# Patient Record
Sex: Female | Born: 1960 | Race: White | Hispanic: No | Marital: Married | Smoking: Former smoker
Health system: Southern US, Community
[De-identification: ages and names within clinical notes are randomized; demographics above are authoritative.]

## PROBLEM LIST (undated history)

## (undated) DIAGNOSIS — E785 Hyperlipidemia, unspecified: Secondary | ICD-10-CM

## (undated) DIAGNOSIS — B009 Herpesviral infection, unspecified: Secondary | ICD-10-CM

## (undated) DIAGNOSIS — E119 Type 2 diabetes mellitus without complications: Secondary | ICD-10-CM

## (undated) DIAGNOSIS — G4733 Obstructive sleep apnea (adult) (pediatric): Secondary | ICD-10-CM

## (undated) DIAGNOSIS — Z9989 Dependence on other enabling machines and devices: Secondary | ICD-10-CM

## (undated) DIAGNOSIS — N393 Stress incontinence (female) (male): Secondary | ICD-10-CM

## (undated) DIAGNOSIS — E039 Hypothyroidism, unspecified: Secondary | ICD-10-CM

## (undated) HISTORY — DX: Hypothyroidism, unspecified: E03.9

## (undated) HISTORY — DX: Hyperlipidemia, unspecified: E78.5

## (undated) HISTORY — DX: Obstructive sleep apnea (adult) (pediatric): G47.33

## (undated) HISTORY — DX: Herpesviral infection, unspecified: B00.9

## (undated) HISTORY — PX: CHOLECYSTECTOMY: SHX55

## (undated) HISTORY — DX: Type 2 diabetes mellitus without complications: E11.9

## (undated) HISTORY — PX: KNEE ARTHROSCOPY: SUR90

## (undated) HISTORY — DX: Stress incontinence (female) (male): N39.3

## (undated) HISTORY — DX: Obstructive sleep apnea (adult) (pediatric): Z99.89

## (undated) HISTORY — DX: Morbid (severe) obesity due to excess calories: E66.01

---

## 1998-09-19 ENCOUNTER — Other Ambulatory Visit: Admission: RE | Admit: 1998-09-19 | Discharge: 1998-09-19 | Payer: Self-pay | Admitting: Obstetrics and Gynecology

## 1998-11-01 ENCOUNTER — Ambulatory Visit (HOSPITAL_COMMUNITY): Admission: RE | Admit: 1998-11-01 | Discharge: 1998-11-01 | Payer: Self-pay | Admitting: Obstetrics and Gynecology

## 1999-05-12 HISTORY — PX: TUBAL LIGATION: SHX77

## 1999-05-12 HISTORY — PX: OTHER SURGICAL HISTORY: SHX169

## 2000-08-17 ENCOUNTER — Other Ambulatory Visit: Admission: RE | Admit: 2000-08-17 | Discharge: 2000-08-17 | Payer: Self-pay | Admitting: Obstetrics and Gynecology

## 2001-09-08 ENCOUNTER — Other Ambulatory Visit: Admission: RE | Admit: 2001-09-08 | Discharge: 2001-09-08 | Payer: Self-pay | Admitting: Obstetrics and Gynecology

## 2008-06-11 ENCOUNTER — Encounter: Admission: RE | Admit: 2008-06-11 | Discharge: 2008-06-11 | Payer: Self-pay | Admitting: Family Medicine

## 2009-01-21 ENCOUNTER — Ambulatory Visit (HOSPITAL_COMMUNITY): Admission: RE | Admit: 2009-01-21 | Discharge: 2009-01-21 | Payer: Self-pay | Admitting: Chiropractic Medicine

## 2009-03-14 ENCOUNTER — Encounter: Admission: RE | Admit: 2009-03-14 | Discharge: 2009-03-14 | Payer: Self-pay | Admitting: Family Medicine

## 2009-04-18 ENCOUNTER — Ambulatory Visit (HOSPITAL_COMMUNITY): Admission: RE | Admit: 2009-04-18 | Discharge: 2009-04-18 | Payer: Self-pay | Admitting: General Surgery

## 2010-01-27 ENCOUNTER — Encounter
Admission: RE | Admit: 2010-01-27 | Discharge: 2010-04-27 | Payer: Self-pay | Source: Home / Self Care | Attending: Family Medicine | Admitting: Family Medicine

## 2010-08-12 LAB — HEPATIC FUNCTION PANEL
ALT: 67 U/L — ABNORMAL HIGH (ref 0–35)
AST: 37 U/L (ref 0–37)
Alkaline Phosphatase: 42 U/L (ref 39–117)
Indirect Bilirubin: 0.6 mg/dL (ref 0.3–0.9)
Total Protein: 7.9 g/dL (ref 6.0–8.3)

## 2011-08-03 ENCOUNTER — Ambulatory Visit (INDEPENDENT_AMBULATORY_CARE_PROVIDER_SITE_OTHER): Payer: BC Managed Care – PPO | Admitting: Family Medicine

## 2011-08-03 ENCOUNTER — Encounter: Payer: Self-pay | Admitting: Family Medicine

## 2011-08-03 VITALS — BP 120/79 | HR 64 | Temp 99.0°F | Resp 16 | Ht 66.0 in | Wt 215.0 lb

## 2011-08-03 DIAGNOSIS — J309 Allergic rhinitis, unspecified: Secondary | ICD-10-CM | POA: Insufficient documentation

## 2011-08-03 DIAGNOSIS — B009 Herpesviral infection, unspecified: Secondary | ICD-10-CM | POA: Insufficient documentation

## 2011-08-03 DIAGNOSIS — N951 Menopausal and female climacteric states: Secondary | ICD-10-CM | POA: Insufficient documentation

## 2011-08-03 DIAGNOSIS — E119 Type 2 diabetes mellitus without complications: Secondary | ICD-10-CM

## 2011-08-03 DIAGNOSIS — N393 Stress incontinence (female) (male): Secondary | ICD-10-CM | POA: Insufficient documentation

## 2011-08-03 DIAGNOSIS — E785 Hyperlipidemia, unspecified: Secondary | ICD-10-CM

## 2011-08-03 DIAGNOSIS — N898 Other specified noninflammatory disorders of vagina: Secondary | ICD-10-CM

## 2011-08-03 DIAGNOSIS — E118 Type 2 diabetes mellitus with unspecified complications: Secondary | ICD-10-CM | POA: Insufficient documentation

## 2011-08-03 DIAGNOSIS — G2581 Restless legs syndrome: Secondary | ICD-10-CM | POA: Insufficient documentation

## 2011-08-03 LAB — CBC
HCT: 42.3 % (ref 36.0–46.0)
Hemoglobin: 13.6 g/dL (ref 12.0–15.0)
MCH: 28 pg (ref 26.0–34.0)
MCV: 87.2 fL (ref 78.0–100.0)
Platelets: 290 10*3/uL (ref 150–400)
RBC: 4.85 MIL/uL (ref 3.87–5.11)

## 2011-08-03 NOTE — Progress Notes (Signed)
  Subjective:    Patient ID: Alejandra Ray, female    DOB: 1961-01-09, 51 y.o.   MRN: [id number]  Diabetes She presents for her follow-up diabetic visit. She has type 2 diabetes mellitus. Her disease course has been stable. There are no hypoglycemic associated symptoms. Pertinent negatives for diabetes include no blurred vision and no foot ulcerations. There are no hypoglycemic complications. Symptoms are stable. Pertinent negatives for diabetic complications include no peripheral neuropathy or retinopathy. Risk factors for coronary artery disease include diabetes mellitus and dyslipidemia (quit smoking 1998). She is compliant with treatment all of the time. She is following a diabetic diet. When asked about meal planning, she reported none. She has not had a previous visit with a dietician. She rarely participates in exercise. There is no change (flucated with recent illness) in her home blood glucose trend. Her overall blood glucose range is 110-130 mg/dl. She does not see a podiatrist.Eye exam is current.   With recent illness elevated BS's in the 140's have trended back down.  Eye exam scheduled for 8/13  Mammogram 4/13  Monthly menses since October lasting 7-10 days with heavy bleeding; on 28 day cycle.  Pap smear 7/12 was normal.    Review of Systems  Eyes: Negative for blurred vision.       Objective:   Physical Exam  Constitutional: She appears well-developed and well-nourished.  HENT:  Head: Normocephalic and atraumatic.  Neck: Neck supple. No thyromegaly present.  Cardiovascular: Normal rate, regular rhythm and normal heart sounds.   Pulmonary/Chest: Effort normal and breath sounds normal.  Abdominal: Soft. Bowel sounds are normal. There is no hepatosplenomegaly.  Lymphadenopathy:    She has no cervical adenopathy.  Neurological: She is alert.  Skin: Skin is warm.  Psychiatric: She has a normal mood and affect.     Results for orders placed in visit on 08/03/11    POCT GLYCOSYLATED HEMOGLOBIN (HGB A1C)      Component Value Range   Hemoglobin A1C 5.9          Assessment & Plan:   1. Hyperlipidemia    2. Type 2 diabetes mellitus  POCT glycosylated hemoglobin (Hb A1C)  3. Perimenopausal  Follicle stimulating hormone, Ambulatory referral to Obstetrics / Gynecology, CBC  4. SUI (stress urinary incontinence, female)    5. Allergic rhinitis    6. HSV-2 infection    7. RLS (restless legs syndrome)     Continue current medications A referral was placed for patient to see  Dr. Ambrose Mantle.  I am concerned given patients age and heavy menses a pelvic ultrasound and EMB would be reassuring. Her pap smear 7/12 was normal and an [facility] was drawn today. Patient in agreement with above plan.

## 2011-08-03 NOTE — Progress Notes (Deleted)
  Subjective:    Patient ID: Alejandra Ray, female    DOB: 1960/06/23, 51 y.o.   MRN: [id number]  HPI    Review of Systems     Objective:   Physical Exam        Assessment & Plan:

## 2011-08-10 ENCOUNTER — Telehealth: Payer: Self-pay

## 2011-08-10 NOTE — Telephone Encounter (Signed)
Pt was called with lab results, please return her call.

## 2011-08-11 NOTE — Telephone Encounter (Signed)
Left message of result and if she had any other questions to CB.

## 2011-11-02 ENCOUNTER — Ambulatory Visit: Payer: BC Managed Care – PPO | Admitting: Family Medicine

## 2011-11-03 ENCOUNTER — Ambulatory Visit: Payer: BC Managed Care – PPO | Admitting: Family Medicine

## 2011-11-20 ENCOUNTER — Ambulatory Visit (INDEPENDENT_AMBULATORY_CARE_PROVIDER_SITE_OTHER): Payer: BC Managed Care – PPO | Admitting: Family Medicine

## 2011-11-20 VITALS — BP 124/81 | HR 65 | Temp 97.2°F | Resp 16 | Ht 66.5 in | Wt 218.0 lb

## 2011-11-20 DIAGNOSIS — IMO0001 Reserved for inherently not codable concepts without codable children: Secondary | ICD-10-CM

## 2011-11-20 DIAGNOSIS — E7800 Pure hypercholesterolemia, unspecified: Secondary | ICD-10-CM

## 2011-11-20 DIAGNOSIS — E669 Obesity, unspecified: Secondary | ICD-10-CM

## 2011-11-20 DIAGNOSIS — E559 Vitamin D deficiency, unspecified: Secondary | ICD-10-CM

## 2011-11-20 DIAGNOSIS — E119 Type 2 diabetes mellitus without complications: Secondary | ICD-10-CM

## 2011-11-20 DIAGNOSIS — M722 Plantar fascial fibromatosis: Secondary | ICD-10-CM

## 2011-11-20 LAB — BASIC METABOLIC PANEL
BUN: 12 mg/dL (ref 6–23)
CO2: 22 mEq/L (ref 19–32)
Calcium: 9.5 mg/dL (ref 8.4–10.5)
Chloride: 106 mEq/L (ref 96–112)
Creat: 0.62 mg/dL (ref 0.50–1.10)
Glucose, Bld: 123 mg/dL — ABNORMAL HIGH (ref 70–99)

## 2011-11-20 LAB — LIPID PANEL
LDL Cholesterol: 120 mg/dL — ABNORMAL HIGH (ref 0–99)
VLDL: 25 mg/dL (ref 0–40)

## 2011-11-20 LAB — POCT GLYCOSYLATED HEMOGLOBIN (HGB A1C): Hemoglobin A1C: 6

## 2011-11-20 MED ORDER — METFORMIN HCL 500 MG PO TABS: 500.0000 mg | ORAL_TABLET | Freq: Two times a day (BID) | ORAL | Status: DC

## 2011-11-20 MED ORDER — PRAVASTATIN SODIUM 20 MG PO TABS: 20.0000 mg | ORAL_TABLET | Freq: Every day | ORAL | Status: DC

## 2011-11-20 NOTE — Patient Instructions (Addendum)
Diabetes Meal Planning Guide The diabetes meal planning guide is a tool to help you plan your meals and snacks. It is important for people with diabetes to manage their blood glucose (sugar) levels. Choosing the right foods and the right amounts throughout your day will help control your blood glucose. Eating right can even help you improve your blood pressure and reach or maintain a healthy weight. CARBOHYDRATE COUNTING MADE EASY When you eat carbohydrates, they turn to sugar. This raises your blood glucose level. Counting carbohydrates can help you control this level so you feel better. When you plan your meals by counting carbohydrates, you can have more flexibility in what you eat and balance your medicine with your food intake. Carbohydrate counting simply means adding up the total amount of carbohydrate grams in your meals and snacks. Try to eat about the same amount at each meal. Foods with carbohydrates are listed below. Each portion below is 1 carbohydrate serving or 15 grams of carbohydrates. Ask your dietician how many grams of carbohydrates you should eat at each meal or snack. Grains and Starches  1 slice bread.    English muffin or hotdog/hamburger bun.    cup cold cereal (unsweetened).   ? cup cooked pasta or rice.    cup starchy vegetables (corn, potatoes, peas, beans, winter squash).   1 tortilla (6 inches).    bagel.   1 waffle or pancake (size of a CD).    cup cooked cereal.   4 to 6 small crackers.  *Whole grain is recommended. Fruit  1 cup fresh unsweetened berries, melon, papaya, pineapple.   1 small fresh fruit.    banana or mango.    cup fruit juice (4 oz unsweetened).    cup canned fruit in natural juice or water.   2 tbs dried fruit.   12 to 15 grapes or cherries.  Milk and Yogurt  1 cup fat-free or 1% milk.   1 cup soy milk.   6 oz light yogurt with sugar-free sweetener.   6 oz low-fat soy yogurt.   6 oz plain yogurt.   Vegetables  1 cup raw or  cup cooked is counted as 0 carbohydrates or a "free" food.   If you eat 3 or more servings at 1 meal, count them as 1 carbohydrate serving.  Other Carbohydrates   oz chips or pretzels.    cup ice cream or frozen yogurt.    cup sherbet or sorbet.   2 inch square cake, no frosting.   1 tbs honey, sugar, jam, jelly, or syrup.   2 small cookies.   3 squares of graham crackers.   3 cups popcorn.   6 crackers.   1 cup broth-based soup.   Count 1 cup casserole or other mixed foods as 2 carbohydrate servings.   Foods with less than 20 calories in a serving may be counted as 0 carbohydrates or a "free" food.  You may want to purchase a book or computer software that lists the carbohydrate gram counts of different foods. In addition, the nutrition facts panel on the labels of the foods you eat are a good source of this information. The label will tell you how big the serving size is and the total number of carbohydrate grams you will be eating per serving. Divide this number by 15 to obtain the number of carbohydrate servings in a portion. Remember, 1 carbohydrate serving equals 15 grams of carbohydrate. SERVING SIZES Measuring foods and serving sizes helps you   make sure you are getting the right amount of food. The list below tells how big or small some common serving sizes are.  1 oz.........4 stacked dice.   3 oz........Marland KitchenDeck of cards.   1 tsp.......Marland KitchenTip of little finger.   1 tbs......Marland KitchenMarland KitchenThumb.   2 tbs.......Marland KitchenGolf ball.    cup......Marland KitchenHalf of a fist.   1 cup.......Marland KitchenA fist.  SAMPLE DIABETES MEAL PLAN Below is a sample meal plan that includes foods from the grain and starches, dairy, vegetable, fruit, and meat groups. A dietician can individualize a meal plan to fit your calorie needs and tell you the number of servings needed from each food group. However, controlling the total amount of carbohydrates in your meal or snack is more important than  making sure you include all of the food groups at every meal. You may interchange carbohydrate containing foods (dairy, starches, and fruits). The meal plan below is an example of a 2000 calorie diet using carbohydrate counting. This meal plan has 17 carbohydrate servings. Breakfast  1 cup oatmeal (2 carb servings).    cup light yogurt (1 carb serving).   1 cup blueberries (1 carb serving).    cup almonds.  Snack  1 large apple (2 carb servings).   1 low-fat string cheese stick.  Lunch  Chicken breast salad.   1 cup spinach.    cup chopped tomatoes.   2 oz chicken breast, sliced.   2 tbs low-fat [place] dressing.   12 whole-wheat crackers (2 carb servings).   12 to 15 grapes (1 carb serving).   1 cup low-fat milk (1 carb serving).  Snack  1 cup carrots.    cup hummus (1 carb serving).  Dinner  3 oz broiled salmon.   1 cup brown rice (3 carb servings).  Snack  1  cups steamed broccoli (1 carb serving) drizzled with 1 tsp olive oil and lemon juice.   1 cup light pudding (2 carb servings).  DIABETES MEAL PLANNING WORKSHEET Your dietician can use this worksheet to help you decide how many servings of foods and what types of foods are right for you.  BREAKFAST Food Group and Servings / Carb Servings Grain/Starches __________________________________ Dairy __________________________________________ Vegetable ______________________________________ Fruit ___________________________________________ Meat __________________________________________ Fat ____________________________________________ LUNCH Food Group and Servings / Carb Servings Grain/Starches ___________________________________ Dairy ___________________________________________ Fruit ____________________________________________ Meat ___________________________________________ Fat _____________________________________________ Laural Golden Food Group and Servings / Carb Servings Grain/Starches  ___________________________________ Dairy ___________________________________________ Fruit ____________________________________________ Meat ___________________________________________ Fat _____________________________________________ SNACKS Food Group and Servings / Carb Servings Grain/Starches ___________________________________ Dairy ___________________________________________ Vegetable _______________________________________ Fruit ____________________________________________ Meat ___________________________________________ Fat _____________________________________________ DAILY TOTALS Starches _________________________ Vegetable ________________________ Fruit ____________________________ Dairy ____________________________ Meat ____________________________ Fat ______________________________ Document Released: 01/22/2005 Document Revised: 04/16/2011 Document Reviewed: 12/03/2008 [employer] Patient Information 2012 [place], [place].    Plantar Fasciitis Plantar fasciitis is a common condition that causes foot pain. It is soreness (inflammation) of the band of tough fibrous tissue on the bottom of the foot that runs from the heel bone (calcaneus) to the ball of the foot. The cause of this soreness may be from excessive standing, poor fitting shoes, running on hard surfaces, being overweight, having an abnormal walk, or overuse (this is common in runners) of the painful foot or feet. It is also common in aerobic exercise dancers and ballet dancers. SYMPTOMS  Most people with plantar fasciitis complain of:  Severe pain in the morning on the bottom of their foot especially when taking the first steps out of bed. This pain recedes after a few minutes of  walking.   Severe pain is experienced also during walking following a long period of inactivity.   Pain is worse when walking barefoot or up stairs  DIAGNOSIS   Your caregiver will diagnose this condition by examining and feeling your  foot.   Special tests such as X-rays of your foot, are usually not needed.  PREVENTION   Consult a sports medicine professional before beginning a new exercise program.   Walking programs offer a good workout. With walking there is a lower chance of overuse injuries common to runners. There is less impact and less jarring of the joints.   Begin all new exercise programs slowly. If problems or pain develop, decrease the amount of time or distance until you are at a comfortable level.   Wear good shoes and replace them regularly.   Stretch your foot and the heel cords at the back of the ankle (Achilles tendon) both before and after exercise.   Run or exercise on even surfaces that are not hard. For example, asphalt is better than pavement.   Do not run barefoot on hard surfaces.   If using a treadmill, vary the incline.   Do not continue to workout if you have foot or joint problems. Seek professional help if they do not improve.  HOME CARE INSTRUCTIONS   Avoid activities that cause you pain until you recover.   Use ice or cold packs on the problem or painful areas after working out.   Only take over-the-counter or prescription medicines for pain, discomfort, or fever as directed by your caregiver.   Soft shoe inserts or athletic shoes with air or gel sole cushions may be helpful.   If problems continue or become more severe, consult a sports medicine caregiver or your own health care provider. Cortisone is a potent anti-inflammatory medication that may be injected into the painful area. You can discuss this treatment with your caregiver.  MAKE SURE YOU:   Understand these instructions.   Will watch your condition.   Will get help right away if you are not doing well or get worse.  Document Released: 01/20/2001 Document Revised: 04/16/2011 Document Reviewed: 03/21/2008 [facility] Patient Information 2012 [place], [place].

## 2011-11-24 ENCOUNTER — Encounter: Payer: Self-pay | Admitting: Family Medicine

## 2011-11-24 DIAGNOSIS — IMO0001 Reserved for inherently not codable concepts without codable children: Secondary | ICD-10-CM | POA: Insufficient documentation

## 2011-11-24 NOTE — Progress Notes (Signed)
S: This 51 y.o. Cauc female has previously been seen by Dr. Hal Hope for Type II Diabetes, HTN and Dyslipidemia. She has a history of Vit D deficiency and today has c/o heel pain, worse upon arising every AM. She denies trauma, past or present.      She wears proper footwear for work but has not replaced her shoes in several months.       She does FSBS at home-values have been "good". No hypoglycemia. No medication side effects.      Energy level is good; denies fatigue, CP, palpitations, dizziness, diaphoresis or weakness.   ROS: as noted in HPI      O: Vital signs stable      GEN: WN.WD; in NAD      HEENT: Normal, EOMI, Conj/sclerae clear; oroph benign      COR: RRR      LUNGS: Normal resp rate and effort      MS: Heel tender on L at insertion of Achilles tendon; no ulcer or redness; good ROM      NEURO: CNs intact; nonfocal   A1C= 6.0%  (A1C in March 2013 = 5.9%)    A/P:  1. Type II or unspecified type diabetes mellitus without mention of complication, not stated as uncontrolled  Basic metabolic panel Continue current medications Improve nutrition and activity level  2. Unspecified vitamin D deficiency  Vitamin D, 25-hydroxy  3. Hypercholesteremia  Lipid panel  4. Plantar fasciitis of left foot  Needs new footwear with supportive footbed and heel cups; do ankle stretching exercises before bearing weight each morning  5.  Obesity (BMI=34.66)                                           Pt given Diabetes meal planning guide

## 2011-11-25 ENCOUNTER — Encounter: Payer: Self-pay | Admitting: Radiology

## 2011-11-25 NOTE — Progress Notes (Signed)
Quick Note:  Please call pt and advise that the following labs are abnormal... Blood sugar is elevated. In-office A1C showed good control of Diabetes. Continue to work on good nutrition and staying active. LDL ("bad") cholesterol is too high. Goal is below 100 if you have Diabetes. Try to improve diet and keep taking Pravastatin 20 mg daily.  Vit D level is good.  Copy to pt. ______

## 2011-11-30 ENCOUNTER — Other Ambulatory Visit: Payer: Self-pay | Admitting: Family Medicine

## 2011-12-01 NOTE — Telephone Encounter (Signed)
Need chart to nurses station. 

## 2011-12-11 ENCOUNTER — Encounter: Payer: Self-pay | Admitting: Family Medicine

## 2012-02-29 ENCOUNTER — Observation Stay (HOSPITAL_COMMUNITY)
Admission: EM | Admit: 2012-02-29 | Discharge: 2012-02-29 | Disposition: A | Discharge status: Home / Self Care | Payer: BC Managed Care – PPO | Attending: Emergency Medicine | Admitting: Emergency Medicine

## 2012-02-29 ENCOUNTER — Encounter (HOSPITAL_COMMUNITY): Payer: Self-pay

## 2012-02-29 ENCOUNTER — Emergency Department (HOSPITAL_COMMUNITY): Payer: BC Managed Care – PPO

## 2012-02-29 DIAGNOSIS — E119 Type 2 diabetes mellitus without complications: Secondary | ICD-10-CM | POA: Insufficient documentation

## 2012-02-29 DIAGNOSIS — R079 Chest pain, unspecified: Principal | ICD-10-CM | POA: Insufficient documentation

## 2012-02-29 DIAGNOSIS — E785 Hyperlipidemia, unspecified: Secondary | ICD-10-CM | POA: Insufficient documentation

## 2012-02-29 LAB — COMPREHENSIVE METABOLIC PANEL
ALT: 71 U/L — ABNORMAL HIGH (ref 0–35)
AST: 38 U/L — ABNORMAL HIGH (ref 0–37)
Albumin: 4.1 g/dL (ref 3.5–5.2)
CO2: 23 mEq/L (ref 19–32)
Calcium: 9.5 mg/dL (ref 8.4–10.5)
Creatinine, Ser: 0.59 mg/dL (ref 0.50–1.10)
Sodium: 138 mEq/L (ref 135–145)
Total Protein: 7.6 g/dL (ref 6.0–8.3)

## 2012-02-29 LAB — POCT I-STAT TROPONIN I: Troponin i, poc: 0.01 ng/mL (ref 0.00–0.08)

## 2012-02-29 LAB — CBC
Hemoglobin: 13.6 g/dL (ref 12.0–15.0)
Platelets: 275 10*3/uL (ref 150–400)
RBC: 4.52 MIL/uL (ref 3.87–5.11)
WBC: 6.7 10*3/uL (ref 4.0–10.5)

## 2012-02-29 MED ORDER — ASPIRIN 81 MG PO CHEW: 324.0000 mg | CHEWABLE_TABLET | Freq: Once | ORAL | Status: DC

## 2012-02-29 NOTE — Progress Notes (Signed)
Utilization review completed.  

## 2012-02-29 NOTE — ED Notes (Signed)
Pt was given 324 asa [place].

## 2012-02-29 NOTE — ED Provider Notes (Signed)
Medical screening examination/treatment/procedure(s) were performed by non-physician practitioner and as supervising physician I was immediately available for consultation/collaboration.   David H Yao, MD 02/29/12 1540 

## 2012-02-29 NOTE — ED Provider Notes (Signed)
9:40 AM Patient to move to CDU holding for repeat troponins.  Patient signed out to me by Dr Silverio Lay.  Pt with right sided chest pain radiating to right arm x 20 minutes this morning, completely resolved at present.  Pt with hx HTN, DM, hyperlipidemia.  Plan is for 0 hr and 3hr troponin.  If negative, pt to be d/c home with PCP follow up.    11:06 AM Patient is resting comfortably.  Denies chest pain at present.  No needs at this time.  Patient is A&Ox4, NAD, RRR, no m/r/g, chest nontender, lungs CTAB, abd soft, NT, extremities without edema, distal pulses intact. Plan is for repeat 3hr marker.  Pt verbalizes understanding and agrees with plan.    1:36 PM Second troponin is negative.  Pt remains asymptomatic.  Plan is for d/c home, PCP follow up. Discussed all results with patient.  Discussed return precautions with patient.  Pt verbalizes understanding and agrees with plan.    Results for orders placed during the hospital encounter of 02/29/12  CBC      Component Value Range   WBC 6.7  4.0 - 10.5 K/uL   RBC 4.52  3.87 - 5.11 MIL/uL   Hemoglobin 13.6  12.0 - 15.0 g/dL   HCT 45.4  09.8 - 11.9 %   MCV 86.3  78.0 - 100.0 fL   MCH 30.1  26.0 - 34.0 pg   MCHC 34.9  30.0 - 36.0 g/dL   RDW 14.7  82.9 - 56.2 %   Platelets 275  150 - 400 K/uL  COMPREHENSIVE METABOLIC PANEL      Component Value Range   Sodium 138  135 - 145 mEq/L   Potassium 4.1  3.5 - 5.1 mEq/L   Chloride 104  96 - 112 mEq/L   CO2 23  19 - 32 mEq/L   Glucose, Bld 123 (*) 70 - 99 mg/dL   BUN 15  6 - 23 mg/dL   Creatinine, Ser 1.30  0.50 - 1.10 mg/dL   Calcium 9.5  8.4 - 86.5 mg/dL   Total Protein 7.6  6.0 - 8.3 g/dL   Albumin 4.1  3.5 - 5.2 g/dL   AST 38 (*) 0 - 37 U/L   ALT 71 (*) 0 - 35 U/L   Alkaline Phosphatase 46  39 - 117 U/L   Total Bilirubin 0.3  0.3 - 1.2 mg/dL   GFR calc non Af Amer >90  >90 mL/min   GFR calc Af Amer >90  >90 mL/min  POCT I-STAT TROPONIN I      Component Value Range   Troponin i, poc 0.00  0.00 -  0.08 ng/mL   Comment 3           POCT I-STAT TROPONIN I      Component Value Range   Troponin i, poc 0.01  0.00 - 0.08 ng/mL   Comment 3            Dg Chest 2 View  02/29/2012  *RADIOLOGY REPORT*  Clinical Data: Chest pain, shortness of breath, nausea, history diabetes, former smoker  CHEST - 2 VIEW  Comparison: None  Findings: Borderline enlargement of cardiac silhouette. Slight pulmonary vascular congestion. Mediastinal contours normal. Minimal peribronchial thickening. Lungs clear. No pleural effusion or pneumothorax. Bones unremarkable.  IMPRESSION: Borderline enlargement of cardiac silhouette with slight pulmonary vascular congestion. Minimal bronchitic changes without infiltrate.   Original Report Authenticated By: Lollie Marrow, M.D.       Irving Burton  [place], [place] 02/29/12 1345

## 2012-02-29 NOTE — ED Provider Notes (Signed)
History     CSN: [id number]  Arrival date & time 02/29/12  4098   First MD Initiated Contact with Patient 02/29/12 [phone]      No chief complaint on file.   (Consider location/radiation/quality/duration/timing/severity/associated sxs/prior treatment) The history is provided by the patient.  Alejandra Ray is a 51 y.o. female history of diabetes, hyperlipidemia here presenting with chest pain. She had right-sided chest pain radiating down her right arm this morning that lasted about 20 minutes. No associated shortness of breath or diaphoresis or abdominal pain. Denies palpitations or recent travel select swelling. No fevers no cough and never had this problem or recent stress test. Cardiac risk factors includes diabetes and she was a past smoker.   Past Medical History  Diagnosis Date  . Diabetes mellitus   . Hyperlipidemia   . Type 2 diabetes mellitus   . Allergic rhinitis   . SUI (stress urinary incontinence), female     No past surgical history on file.  No family history on file.  History  Substance Use Topics  . Smoking status: Former Games developer  . Smokeless tobacco: Not on file  . Alcohol Use: Not on file    OB History    Grav Para Term Preterm Abortions TAB SAB Ect Mult Living                  Review of Systems  Cardiovascular: Positive for chest pain.  All other systems reviewed and are negative.    Allergies  Codeine and Sulfa antibiotics  Home Medications   Current Outpatient Rx  Name Route Sig Dispense Refill  . ASPIRIN 81 MG PO TABS Oral Take 81 mg by mouth daily.    Marland Kitchen NEPHROCAPS 1 MG PO CAPS Oral Take 1 capsule by mouth daily.    . CYCLOBENZAPRINE HCL 10 MG PO TABS  TAKE 1 TABLET BY MOUTH AT BEDTIME AS NEEDED 30 tablet 2  . OMEGA-3 FATTY ACIDS 1000 MG PO CAPS Oral Take 2 g by mouth daily.    Marland Kitchen METFORMIN HCL 500 MG PO TABS Oral Take 1 tablet (500 mg total) by mouth 2 (two) times daily with a meal. 60 tablet 5  . MULTIVITAMINS PO CAPS Oral Take 1  capsule by mouth daily.    Marland Kitchen PANTOPRAZOLE SODIUM 40 MG PO TBEC Oral Take 40 mg by mouth daily.    Marland Kitchen PRAVASTATIN SODIUM 20 MG PO TABS Oral Take 1 tablet (20 mg total) by mouth daily. 30 tablet 5  . VALACYCLOVIR HCL 1 G PO TABS Oral Take 1,000 mg by mouth 2 (two) times daily.      BP 129/84  Pulse 53  Temp 98.7 F (37.1 C) (Oral)  Resp 17  SpO2 97%  Physical Exam  Nursing note and vitals reviewed. Constitutional: She is oriented to person, place, and time. She appears well-developed and well-nourished.       NAD, no pain now   HENT:  Head: Normocephalic.  Mouth/Throat: Oropharynx is clear and moist.  Eyes: Conjunctivae normal are normal. Pupils are equal, round, and reactive to light.  Neck: Normal range of motion. Neck supple.  Cardiovascular: Normal rate, regular rhythm and normal heart sounds.   Pulmonary/Chest: Effort normal and breath sounds normal. No respiratory distress. She has no wheezes. She has no rales. She exhibits no tenderness.  Abdominal: Soft. Bowel sounds are normal. She exhibits no distension. There is no tenderness. There is no rebound.  Musculoskeletal: Normal range of motion. She exhibits no edema  and no tenderness.  Neurological: She is alert and oriented to person, place, and time.  Skin: Skin is warm and dry.  Psychiatric: She has a normal mood and affect. Her behavior is normal. Judgment and thought content normal.    ED Course  Procedures (including critical care time)   Labs Reviewed  CBC  COMPREHENSIVE METABOLIC PANEL   No results found.   No diagnosis found.   Date: 02/29/2012  Rate: 52  Rhythm: sinus bradycardia  QRS Axis: normal  Intervals: normal  ST/T Wave abnormalities: normal  Conduction Disutrbances:none  Narrative Interpretation:   Old EKG Reviewed: unchanged    MDM  Alejandra Ray is a 51 y.o. female here with chest pain that resolved. Will consider ACS and will get labs, trop x 2, CXR. Will likely transfer to CDU  pending enzymes x2. I gave report to CDU PA.          Richardean Canal, MD 02/29/12 [phone]

## 2012-02-29 NOTE — ED Notes (Signed)
Pt presents to ED with right sided chest pain. Pt states was sob and dizzy with onset.

## 2012-03-24 ENCOUNTER — Encounter: Payer: Self-pay | Admitting: Family Medicine

## 2012-03-24 ENCOUNTER — Ambulatory Visit (INDEPENDENT_AMBULATORY_CARE_PROVIDER_SITE_OTHER): Payer: BC Managed Care – PPO | Admitting: Family Medicine

## 2012-03-24 VITALS — BP 117/77 | HR 60 | Temp 98.3°F | Resp 16 | Ht 66.5 in | Wt 220.0 lb

## 2012-03-24 DIAGNOSIS — E669 Obesity, unspecified: Secondary | ICD-10-CM

## 2012-03-24 DIAGNOSIS — E119 Type 2 diabetes mellitus without complications: Secondary | ICD-10-CM

## 2012-03-24 DIAGNOSIS — E7800 Pure hypercholesterolemia, unspecified: Secondary | ICD-10-CM

## 2012-03-24 LAB — T4, FREE: Free T4: 1.33 ng/dL (ref 0.80–1.80)

## 2012-03-24 LAB — LDL CHOLESTEROL, DIRECT: Direct LDL: 105 mg/dL — ABNORMAL HIGH

## 2012-03-24 LAB — TSH: TSH: 4.886 u[IU]/mL — ABNORMAL HIGH (ref 0.350–4.500)

## 2012-03-24 MED ORDER — IPRATROPIUM BROMIDE 0.06 % NA SOLN: 2.0000 | Freq: Four times a day (QID) | NASAL | Status: DC

## 2012-03-24 MED ORDER — METFORMIN HCL 500 MG PO TABS: 500.0000 mg | ORAL_TABLET | Freq: Two times a day (BID) | ORAL | Status: DC

## 2012-03-27 ENCOUNTER — Encounter: Payer: Self-pay | Admitting: Family Medicine

## 2012-03-27 NOTE — Progress Notes (Signed)
S:  This 51 y.o. Cauc female has Type II DM and is here for follow-up; she reports compliance with medications and no hypoglycemia. She has been actively trying to reduce weight without much success. Seasonal allergies have been problematic; needs refill for nasal spray.   ROS: Negative for diaphoresis, fatigue, sinus pain or pressure, sore throat or hoarseness, CP or tightness, palpitations, SOB or cough, edema, HA, dizziness, weakness, numbness or syncope.    O:  Filed Vitals:   03/24/12 0841  BP: 117/77  Pulse: 60  Temp: 98.3 F (36.8 C)  Resp: 16   GEN: In NAD: WN,WD.  HEENT: [place]/AT. EOMI w/ injected conj and clear sclerae. EACs normal. TMs w/ scant effusion. Post ph erythematous w/o exudate. NT sinuses. COR: RRR; no edema. LUNGS: CTA w/o rhonchi or wheezes. SKIN: No rashes. NEURO: A&O x 3; CNs intact. Nonfocal.  A/P: 1. Type II or unspecified type diabetes mellitus without mention of complication, not stated as uncontrolled  Last A1c= 6.0 % in July 2013. Continue current medications.  2. Obesity - check online for nutritional guidelines for weight loss. T4, Free, TSH  3. Elevated LDL cholesterol level  LDL Cholesterol, Direct; continue 2 grams Fish Oil daily

## 2012-03-28 NOTE — Progress Notes (Signed)
Quick Note:  Please call pt and advise that the following labs are abnormal...  Thyroid tests are borderline; this should be monitored so when you return for physical exam, thyroid tests need to be repeated. If you notice any changes that suggest your thyroid is clearly underactive (more weight gain, constipation, decreased energy, hair loss or other changes in your hair or skin), contact the office and we will discuss rechecking thyroid function sooner.  Copy to pt. ______

## 2012-04-07 ENCOUNTER — Ambulatory Visit (INDEPENDENT_AMBULATORY_CARE_PROVIDER_SITE_OTHER): Payer: BC Managed Care – PPO | Admitting: Family Medicine

## 2012-04-07 VITALS — BP 126/78 | HR 98 | Temp 98.9°F | Resp 18 | Ht 66.0 in | Wt 214.8 lb

## 2012-04-07 DIAGNOSIS — R8290 Unspecified abnormal findings in urine: Secondary | ICD-10-CM

## 2012-04-07 DIAGNOSIS — N12 Tubulo-interstitial nephritis, not specified as acute or chronic: Secondary | ICD-10-CM

## 2012-04-07 DIAGNOSIS — M545 Low back pain, unspecified: Secondary | ICD-10-CM

## 2012-04-07 DIAGNOSIS — R82998 Other abnormal findings in urine: Secondary | ICD-10-CM

## 2012-04-07 DIAGNOSIS — N390 Urinary tract infection, site not specified: Secondary | ICD-10-CM

## 2012-04-07 DIAGNOSIS — R509 Fever, unspecified: Secondary | ICD-10-CM

## 2012-04-07 LAB — POCT UA - MICROSCOPIC ONLY
Casts, Ur, LPF, POC: NEGATIVE
Mucus, UA: POSITIVE
Yeast, UA: NEGATIVE

## 2012-04-07 LAB — POCT CBC
Hemoglobin: 14.2 g/dL (ref 12.2–16.2)
MCH, POC: 28.3 pg (ref 27–31.2)
MID (cbc): 0.9 (ref 0–0.9)
MPV: 11 fL (ref 0–99.8)
POC Granulocyte: 8 — AB (ref 2–6.9)
POC MID %: 8.7 %M (ref 0–12)
Platelet Count, POC: 305 10*3/uL (ref 142–424)
RBC: 5.02 M/uL (ref 4.04–5.48)

## 2012-04-07 LAB — POCT URINALYSIS DIPSTICK
Bilirubin, UA: NEGATIVE
Glucose, UA: NEGATIVE
Nitrite, UA: NEGATIVE
Spec Grav, UA: 1.025
Urobilinogen, UA: 1

## 2012-04-07 MED ORDER — CIPROFLOXACIN HCL 500 MG PO TABS: 500.0000 mg | ORAL_TABLET | Freq: Two times a day (BID) | ORAL | Status: DC

## 2012-04-07 NOTE — Patient Instructions (Signed)
Fluids Rest Take cipro twice daily Tylenol or ibuprofen for fever Return or emergency room if worse

## 2012-04-07 NOTE — Progress Notes (Signed)
Subjective: 51 year old lady with several problems it started last Sunday. She had pain in her back. She thinks that sometimes this is brought on by restless legs and her, but it is continued to bother her all week. She's had a change in the other for urine, which may be attributable to a new fish oil medicine. Tuesday she ran a high fever, and again yesterday had fever at work and was sent home. The temperature comes down with taking medications. She continues to feel bad today. Dry cough started today. She did have her flu shot back in October  Objective: Pleasant lady in some discomfort. She is cold. Her TMs are normal. Throat was clear. Neck supple without nodes. Chest is clear to auscultation. Heart regular without murmurs. Has bilateral CVA tenderness, probably in the muscles of her back but good range of motion.  Assessment: Back pain Fever, chills, cough  Plan: Flu swab CBC Urinalysis  Results for orders placed in visit on 04/07/12  POCT INFLUENZA A/B      Component Value Range   Influenza A, POC Negative     Influenza B, POC Negative    POCT CBC      Component Value Range   WBC 10.8 (*) 4.6 - 10.2 K/uL   Lymph, poc 1.9  0.6 - 3.4   POC LYMPH PERCENT 17.5  10 - 50 %L   MID (cbc) 0.9  0 - 0.9   POC MID % 8.7  0 - 12 %M   POC Granulocyte 8.0 (*) 2 - 6.9   Granulocyte percent 73.8  37 - 80 %G   RBC 5.02  4.04 - 5.48 M/uL   Hemoglobin 14.2  12.2 - 16.2 g/dL   HCT, POC 86.5  78.4 - 47.9 %   MCV 91.0  80 - 97 fL   MCH, POC 28.3  27 - 31.2 pg   MCHC 31.1 (*) 31.8 - 35.4 g/dL   RDW, POC 69.6     Platelet Count, POC 305  142 - 424 K/uL   MPV 11.0  0 - 99.8 fL  POCT UA - MICROSCOPIC ONLY      Component Value Range   WBC, Ur, HPF, POC 15-25     RBC, urine, microscopic 3-5     Bacteria, U Microscopic 2+     Mucus, UA positive     Epithelial cells, urine per micros 1-3     Crystals, Ur, HPF, POC neg     Casts, Ur, LPF, POC neg     Yeast, UA neg    POCT URINALYSIS DIPSTICK        Component Value Range   Color, UA yellow     Clarity, UA clear     Glucose, UA neg     Bilirubin, UA neg     Ketones, UA trace     Spec Grav, UA 1.025     Blood, UA trace     pH, UA 6.0     Protein, UA 30     Urobilinogen, UA 1.0     Nitrite, UA neg     Leukocytes, UA small (1+)

## 2012-04-10 ENCOUNTER — Encounter: Payer: Self-pay | Admitting: Family Medicine

## 2012-04-10 LAB — URINE CULTURE: Colony Count: >=100000

## 2012-06-20 ENCOUNTER — Ambulatory Visit (INDEPENDENT_AMBULATORY_CARE_PROVIDER_SITE_OTHER): Payer: BC Managed Care – PPO | Admitting: Physician Assistant

## 2012-06-20 VITALS — BP 130/85 | HR 84 | Temp 98.8°F | Resp 17 | Ht 66.0 in | Wt 218.0 lb

## 2012-06-20 DIAGNOSIS — N390 Urinary tract infection, site not specified: Secondary | ICD-10-CM

## 2012-06-20 DIAGNOSIS — R300 Dysuria: Secondary | ICD-10-CM

## 2012-06-20 LAB — POCT UA - MICROSCOPIC ONLY
Casts, Ur, LPF, POC: NEGATIVE
Crystals, Ur, HPF, POC: NEGATIVE

## 2012-06-20 LAB — POCT URINALYSIS DIPSTICK
Glucose, UA: NEGATIVE
Nitrite, UA: NEGATIVE
Protein, UA: NEGATIVE
Urobilinogen, UA: 0.2

## 2012-06-20 MED ORDER — NITROFURANTOIN MONOHYD MACRO 100 MG PO CAPS: 100.0000 mg | ORAL_CAPSULE | Freq: Two times a day (BID) | ORAL | Status: DC

## 2012-06-20 NOTE — Progress Notes (Signed)
   [address], [place] [address]   Phone [phone]  Subjective:    Patient ID: Alejandra Ray, female    DOB: [birth year], 52 y.o.   MRN: [id number]  HPI Pt presents to clinic with 3 days h/o urinary symptoms and foul urine odor and dysuria.  She is having some back pain and pressure over her bladder.  She has had no change in sexually partners.     Review of Systems  Constitutional: Negative for fever.  Gastrointestinal: Positive for abdominal pain. Negative for nausea.  Genitourinary: Positive for dysuria. Negative for frequency, hematuria and vaginal discharge.  Musculoskeletal: Positive for back pain.       Objective:   Physical Exam  Vitals reviewed. Constitutional: She is oriented to person, place, and time. She appears well-developed and well-nourished.  HENT:  Head: Normocephalic and atraumatic.  Right Ear: External ear normal.  Left Ear: External ear normal.  Cardiovascular: Normal rate, regular rhythm and normal heart sounds.   No murmur heard. Pulmonary/Chest: Effort normal and breath sounds normal.  Abdominal: Soft. There is tenderness (suprapubic). There is no CVA tenderness.  Neurological: She is alert and oriented to person, place, and time.  Skin: Skin is warm and dry.  Psychiatric: She has a normal mood and affect. Her behavior is normal. Judgment and thought content normal.    Results for orders placed in visit on 06/20/12  POCT UA - MICROSCOPIC ONLY      Result Value Range   WBC, Ur, HPF, POC 12-24     RBC, urine, microscopic 1-5     Bacteria, U Microscopic trace     Mucus, UA trace     Epithelial cells, urine per micros 3-8     Crystals, Ur, HPF, POC negative     Casts, Ur, LPF, POC negative     Yeast, UA negative    POCT URINALYSIS DIPSTICK      Result Value Range   Color, UA yellow     Clarity, UA cloudy     Glucose, UA negative     Bilirubin, UA negative     Ketones, UA negative     Spec Grav, UA >=1.030     Blood, UA trace-lysed       pH, UA 5.5     Protein, UA negative     Urobilinogen, UA 0.2     Nitrite, UA negative     Leukocytes, UA small (1+)           Assessment & Plan:   1. Urinary tract infection, site not specified  nitrofurantoin, macrocrystal-monohydrate, (MACROBID) 100 MG capsule   nitrofurantoin, macrocrystal-monohydrate, (MACROBID) 100 MG capsule   Urine culture  2. Burning with urination  POCT UA - Microscopic Only   POCT UA - Microscopic Only   POCT urinalysis dipstick   Push fluids.  F/u prn

## 2012-06-22 LAB — URINE CULTURE

## 2012-09-22 ENCOUNTER — Ambulatory Visit (INDEPENDENT_AMBULATORY_CARE_PROVIDER_SITE_OTHER): Payer: BC Managed Care – PPO | Admitting: Family Medicine

## 2012-09-22 ENCOUNTER — Encounter: Payer: Self-pay | Admitting: Family Medicine

## 2012-09-22 VITALS — BP 122/76 | HR 65 | Temp 98.0°F | Resp 16 | Ht 66.0 in | Wt 218.4 lb

## 2012-09-22 DIAGNOSIS — R195 Other fecal abnormalities: Secondary | ICD-10-CM

## 2012-09-22 DIAGNOSIS — M25562 Pain in left knee: Secondary | ICD-10-CM

## 2012-09-22 DIAGNOSIS — Z0000 Encounter for general adult medical examination without abnormal findings: Secondary | ICD-10-CM

## 2012-09-22 DIAGNOSIS — E669 Obesity, unspecified: Secondary | ICD-10-CM

## 2012-09-22 DIAGNOSIS — M25569 Pain in unspecified knee: Secondary | ICD-10-CM

## 2012-09-22 DIAGNOSIS — E119 Type 2 diabetes mellitus without complications: Secondary | ICD-10-CM

## 2012-09-22 LAB — COMPREHENSIVE METABOLIC PANEL
ALT: 70 U/L — ABNORMAL HIGH (ref 0–35)
AST: 46 U/L — ABNORMAL HIGH (ref 0–37)
Alkaline Phosphatase: 42 U/L (ref 39–117)
Creat: 0.74 mg/dL (ref 0.50–1.10)
Total Bilirubin: 0.8 mg/dL (ref 0.3–1.2)

## 2012-09-22 LAB — POCT URINALYSIS DIPSTICK
Bilirubin, UA: NEGATIVE
Ketones, UA: NEGATIVE
Protein, UA: NEGATIVE
Spec Grav, UA: 1.025
pH, UA: 5.5

## 2012-09-22 LAB — IFOBT (OCCULT BLOOD): IFOBT: NEGATIVE

## 2012-09-22 LAB — TSH: TSH: 5.081 u[IU]/mL — ABNORMAL HIGH (ref 0.350–4.500)

## 2012-09-22 LAB — LIPID PANEL
HDL: 48 mg/dL (ref >39)
LDL Cholesterol: 106 mg/dL — ABNORMAL HIGH (ref 0–99)
Total CHOL/HDL Ratio: 3.9 Ratio

## 2012-09-22 MED ORDER — VALACYCLOVIR HCL 1 G PO TABS: 1000.0000 mg | ORAL_TABLET | Freq: Two times a day (BID) | ORAL | Status: DC

## 2012-09-22 MED ORDER — METFORMIN HCL 500 MG PO TABS: ORAL_TABLET | ORAL | Status: DC

## 2012-09-22 MED ORDER — CYCLOBENZAPRINE HCL 10 MG PO TABS: ORAL_TABLET | ORAL | Status: DC

## 2012-09-22 NOTE — Progress Notes (Signed)
Subjective:    Patient ID: Alejandra Ray, female    DOB: Jan 19, 1961, 52 y.o.   MRN: [id number]  HPI  This 52 y.o. Cauc female is here for CPE; PAP (negative) done by Dr. Ambrose Mantle and MMG (neg)  done at work in [employer] [place]. Pt has Type II DM and is sompliant w/ medications. She is  having loose stools, sometimes related to lactose-containing foods. She has modified diet w/  some improvement. Pt thinks Metformin may be a contributing factor; she has mild indigestion.  FSBS= 88-145.    Patient Active Problem List   Diagnosis Date Noted  . Obesity, Class II, BMI 35.0-39.9, with comorbidity (see actual BMI) 11/24/2011  . Perimenopausal 08/03/2011  . SUI (stress urinary incontinence, female) 08/03/2011  . Allergic rhinitis 08/03/2011  . 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 RLS (restless legs syndrome) 08/03/2011  . Hyperlipidemia   . Type 2 diabetes mellitus     PMHx, Soc Hx and Fam Hx reviewed.   Review of Systems  Constitutional: Negative.   HENT: Negative.        Dental visits every 6 months.  Eyes: Negative.        Vision care at least annually; wears contacts  Respiratory: Negative for cough, chest tightness and shortness of breath.   Cardiovascular: Negative.   Gastrointestinal: Negative for nausea, vomiting, abdominal pain, constipation, blood in stool and abdominal distention.       Loose stools and indigestion.  Endocrine: Negative.   Genitourinary:       Per GYN.  Musculoskeletal: Positive for arthralgias.       Chronic L knee pain- s/p arthroscopic surgery.  Skin:       Skin tags on trunk and extremities; no bleeding or change in size or color.  Allergic/Immunologic: Negative.   Neurological: Negative.   Hematological: Negative.   Psychiatric/Behavioral: Negative.        Objective:   Physical Exam  Nursing note and vitals reviewed. Constitutional: She is oriented to person, place, and time. Vital signs are normal. She appears well-developed and  well-nourished. No distress.  HENT:  Head: Normocephalic and atraumatic.  Right Ear: Hearing, tympanic membrane, external ear and ear canal normal.  Left Ear: Hearing, tympanic membrane, external ear and ear canal normal.  Nose: Nose normal. No mucosal edema, rhinorrhea, nasal deformity or septal deviation.  Mouth/Throat: Uvula is midline, oropharynx is clear and moist and mucous membranes are normal. No oral lesions. Normal dentition. No dental caries.  Eyes: Conjunctivae, EOM and lids are normal. Pupils are equal, round, and reactive to light. No scleral icterus.  Fundoscopic exam:      The right eye shows no arteriolar narrowing, no AV nicking, no hemorrhage and no papilledema. The right eye shows red reflex.       The left eye shows no arteriolar narrowing, no AV nicking, no hemorrhage and no papilledema. The left eye shows red reflex.  Neck: Normal range of motion. Neck supple. No tracheal deviation present. No thyromegaly present.  Cardiovascular: Normal rate, regular rhythm, normal heart sounds and intact distal pulses.  Exam reveals no gallop and no friction rub.   No murmur heard. Pulmonary/Chest: Effort normal and breath sounds normal. No respiratory distress. She has no wheezes. Right breast exhibits no inverted nipple, no mass, no nipple discharge, no skin change and no tenderness. Left breast exhibits no inverted nipple, no mass, no nipple discharge, no skin change and no tenderness. Breasts are symmetrical.  Abdominal: Soft. Normal appearance. She  exhibits no distension, no pulsatile midline mass and no mass. Bowel sounds are increased. There is no hepatosplenomegaly. There is tenderness in the left lower quadrant. There is no rigidity, no rebound, no guarding and no CVA tenderness. No hernia.  Genitourinary: Guaiac negative stool.  Musculoskeletal: She exhibits no edema.       Left knee: She exhibits decreased range of motion. She exhibits no swelling, no effusion, no deformity and  normal alignment. Tenderness found.  Lymphadenopathy:       Head (right side): No submental, no submandibular, no tonsillar, no posterior auricular and no occipital adenopathy present.       Head (left side): No submental, no submandibular, no tonsillar, no posterior auricular and no occipital adenopathy present.    She has no cervical adenopathy.       Right cervical: No posterior cervical adenopathy present.      Left cervical: No posterior cervical adenopathy present.    She has no axillary adenopathy.       Right: No inguinal and no supraclavicular adenopathy present.       Left: No inguinal and no supraclavicular adenopathy present.  Neurological: She is alert and oriented to person, place, and time. She has normal reflexes. No cranial nerve deficit. She exhibits normal muscle tone. Coordination normal.  Skin: Skin is warm and dry. No rash noted. No erythema. No pallor.  Skin lesions- 2-3 mm flat scaly round lesions on trunk and proximal ext (c/w shintags or seb k).  Psychiatric: She has a normal mood and affect. Her behavior is normal. Judgment and thought content normal.    Results for orders placed in visit on 09/22/12  POCT URINALYSIS DIPSTICK      Result Value Range   Color, UA yellow     Clarity, UA clear     Glucose, UA neg     Bilirubin, UA neg     Ketones, UA neg     Spec Grav, UA 1.025     Blood, UA trace     pH, UA 5.5     Protein, UA neg     Urobilinogen, UA 0.2     Nitrite, UA neg     Leukocytes, UA Negative    IFOBT (OCCULT BLOOD)      Result Value Range   IFOBT Negative    POCT GLYCOSYLATED HEMOGLOBIN (HGB A1C)      Result Value Range   Hemoglobin A1C 5.7          Assessment & Plan:  Routine general medical examination at a health care facility -  Encouraged improved nutrition and weight reduction. Plan: POCT urinalysis dipstick, IFOBT POC (occult bld, rslt in office), POCT glycosylated hemoglobin (Hb A1C), Microalbumin, urine, Lipid panel, TSH,  Comprehensive metabolic panel  Type II or unspecified type diabetes mellitus without mention of complication, not stated as uncontrolled- stable and controlled on current medication.  Left knee pain - Plan: Ambulatory referral to Orthopedic Surgery  Loose stools- may be due to lactose intol and Metformin sensitivity; reduce Metformin to once a day.  Obesity, unspecified   Meds ordered this encounter  Medications  . OVER THE COUNTER MEDICATION    Sig: OTC allergy med taking  . DISCONTD: valACYclovir (VALTREX) 1000 MG tablet    Sig: Take 1,000 mg by mouth 2 (two) times daily.  Marland Kitchen OVER THE COUNTER MEDICATION    Sig: OTC Digestive Advantage taking  . valACYclovir (VALTREX) 1000 MG tablet    Sig: Take 1 tablet (1,000  mg total) by mouth 2 (two) times daily.    Dispense:  60 tablet    Refill:  3  . cyclobenzaprine (FLEXERIL) 10 MG tablet    Sig: TAKE 1 TABLET BY MOUTH AT BEDTIME AS NEEDED    Dispense:  30 tablet    Refill:  3  . metFORMIN (GLUCOPHAGE) 500 MG tablet    Sig: Take 1 tablet daily with your largest meal.    Dispense:  60 tablet    Refill:  3

## 2012-09-22 NOTE — Patient Instructions (Addendum)
Keeping You Healthy  Get These Tests  Blood Pressure- Have your blood pressure checked by your healthcare provider at least once a year.  Normal blood pressure is 120/80.  Weight- Have your body mass index (BMI) calculated to screen for obesity.  BMI is a measure of body fat based on height and weight.  You can calculate your own BMI at https://www.west-esparza.com/  Cholesterol- Have your cholesterol checked every year.  Diabetes- Have your blood sugar checked every year if you have high blood pressure, high cholesterol, a family history of diabetes or if you are overweight.  Pap Smear- Have a pap smear every 1 to 3 years if you have been sexually active.  If you are older than 65 and recent pap smears have been normal you may not need additional pap smears.  In addition, if you have had a hysterectomy  For benign disease additional pap smears are not necessary.  Mammogram-Yearly mammograms are essential for early detection of breast cancer  Screening for Colon Cancer- Colonoscopy starting at age 33. Screening may begin sooner depending on your family history and other health conditions.  Follow up colonoscopy as directed by your Gastroenterologist.  Screening for Osteoporosis- Screening begins at age 28 with bone density scanning, sooner if you are at higher risk for developing Osteoporosis.  Get these medicines  Calcium with Vitamin D- Your body requires 1200-1500 mg of Calcium a day and [phone] IU of Vitamin D a day.  You can only absorb 500 mg of Calcium at a time therefore Calcium must be taken in 2 or 3 separate doses throughout the day.  Hormones- Hormone therapy has been associated with increased risk for certain cancers and heart disease.  Talk to your healthcare provider about if you need relief from menopausal symptoms.  Aspirin- Ask your healthcare provider about taking Aspirin to prevent Heart Disease and Stroke.  Get these Immuniztions  Flu shot- Every fall  Pneumonia  shot- Once after the age of 69; if you are younger ask your healthcare provider if you need a pneumonia shot.  Tetanus- Every ten years. Next Tetanus due in 2018.  Zostavax- Once after the age of 33 to prevent shingles.  Take these steps  Don't smoke- Your healthcare provider can help you quit. For tips on how to quit, ask your healthcare provider or go to www.smokefree.gov or call 1-800 QUIT-NOW.  Be physically active- Exercise 5 days a week for a minimum of 30 minutes.  If you are not already physically active, start slow and gradually work up to 30 minutes of moderate physical activity.  Try walking, dancing, bike riding, swimming, etc.  Eat a healthy diet- Eat a variety of healthy foods such as fruits, vegetables, whole grains, low fat milk, low fat cheeses, yogurt, lean meats, chicken, fish, eggs, dried beans, tofu, etc.  For more information go to www.thenutritionsource.org  Dental visit- Brush and floss teeth twice daily; visit your dentist twice a year.  Eye exam- Visit your Optometrist or Ophthalmologist yearly.  Drink alcohol in moderation- Limit alcohol intake to one drink or less a day.  Never drink and drive.  Depression- Your emotional health is as important as your physical health.  If you're feeling down or losing interest in things you normally enjoy, please talk to your healthcare provider.  Seat Belts- can save your life; always wear one  Smoke/Carbon Monoxide detectors- These detectors need to be installed on the appropriate level of your home.  Replace batteries at least once a  year.  Violence- If anyone is threatening or hurting you, please tell your healthcare provider.  Living Will/ Health care power of attorney- Discuss with your healthcare provider and family.    Obesity Obesity is defined as having too much total body fat and a body mass index (BMI) of 30 or more. BMI is an estimate of body fat and is calculated from your height and weight. Obesity happens  when you consume more calories than you can burn by exercising or performing daily physical tasks. Prolonged obesity can cause major illnesses or emergencies, such as:   A stroke.  Heart disease.  Diabetes.  Cancer.  Arthritis.  High blood pressure (hypertension).  High cholesterol.  Sleep apnea.  Erectile dysfunction.  Infertility problems. CAUSES   Regularly eating unhealthy foods.  Physical inactivity.  Certain disorders, such as an underactive thyroid (hypothyroidism), Cushing's syndrome, and polycystic ovarian syndrome.  Certain medicines, such as steroids, some depression medicines, and antipsychotics.  Genetics.  Lack of sleep. DIAGNOSIS  A caregiver can diagnose obesity after calculating your BMI. Obesity will be diagnosed if your BMI is 30 or higher.  There are other methods of measuring obesity levels. Some other methods include measuring your skin fold thickness, your waist circumference, and comparing your hip circumference to your waist circumference. TREATMENT  A healthy treatment program includes some or all of the following:  Long-term dietary changes.  Exercise and physical activity.  Behavioral and lifestyle changes.  Medicine only under the supervision of your caregiver. Medicines may help, but only if they are used with diet and exercise programs. An unhealthy treatment program includes:  Fasting.  Fad diets.  Supplements and drugs. These choices do not succeed in long-term weight control.  HOME CARE INSTRUCTIONS   Exercise and perform physical activity as directed by your caregiver. To increase physical activity, try the following:  Use stairs instead of elevators.  Park farther away from store entrances.  Garden, bike, or walk instead of watching television or using the computer.  Eat healthy, low-calorie foods and drinks on a regular basis. Eat more fruits and vegetables. Use low-calorie cookbooks or take healthy cooking  classes.  Limit fast food, sweets, and processed snack foods.  Eat smaller portions.  Keep a daily journal of everything you eat. There are many free websites to help you with this. It may be helpful to measure your foods so you can determine if you are eating the correct portion sizes.  Avoid drinking alcohol. Drink more water and drinks without calories.  Take vitamins and supplements only as recommended by your caregiver.  Weight-loss support groups, Optometrist, counselors, and stress reduction education can also be very helpful. SEEK IMMEDIATE MEDICAL CARE IF:  You have chest pain or tightness.  You have trouble breathing or feel short of breath.  You have weakness or leg numbness.  You feel confused or have trouble talking.  You have sudden changes in your vision. MAKE SURE YOU:  Understand these instructions.  Will watch your condition.  Will get help right away if you are not doing well or get worse. Document Released: 06/04/2004 Document Revised: 10/27/2011 Document Reviewed: 06/03/2011 [facility] Patient Information 2013 [place], [place].   Metformin 500 mg- take 1 tablet once a day with your largest meal. Work on better nutrition and increased activity. Your referral to the Orthopedist has been ordered.

## 2012-09-23 ENCOUNTER — Other Ambulatory Visit: Payer: Self-pay | Admitting: Family Medicine

## 2012-09-23 DIAGNOSIS — E039 Hypothyroidism, unspecified: Secondary | ICD-10-CM

## 2012-09-23 LAB — MICROALBUMIN, URINE: Microalb, Ur: 1.44 mg/dL (ref 0.00–1.89)

## 2012-09-23 MED ORDER — LEVOTHYROXINE SODIUM 50 MCG PO TABS: 50.0000 ug | ORAL_TABLET | Freq: Every day | ORAL | Status: DC

## 2012-09-23 NOTE — Progress Notes (Signed)
Quick Note:  Please contact pt and advise that the following labs are abnormal... LDL cholesterol is higher than desired for Diabetics; focus on better nutrition and exercise for weight loss. If not improve when checked next time, will have to discuss medication. Continue taking Fish Oil every day. Goal LDL= <80 and triglycerides need to be below 150. Chemistry profile looks good except blood sugar was elevated and liver tests are slightly above normal; this is probably due to "fatty liver" seen in overweight patients and Diabetics.  Thyroid test is above normal; I am prescribing a low dose of thyroid medication to be taken daily. Thyroid test will need to be checked again in ~2 -3 months. I am placing an order for that test to see if you have been prescribed the right dose of medication for thyroid replacement.  Copy to pt. ______

## 2012-11-22 ENCOUNTER — Encounter: Payer: Self-pay | Admitting: Family Medicine

## 2012-11-22 ENCOUNTER — Ambulatory Visit: Payer: BC Managed Care – PPO | Admitting: Family Medicine

## 2012-11-22 VITALS — BP 118/74 | HR 70 | Temp 98.5°F | Resp 16 | Ht 65.5 in | Wt 221.5 lb

## 2012-11-22 DIAGNOSIS — E039 Hypothyroidism, unspecified: Secondary | ICD-10-CM

## 2012-11-22 NOTE — Patient Instructions (Signed)

## 2012-11-22 NOTE — Progress Notes (Signed)
S: This 52 y.o. Cauc female returns for monitoring of thyroid medication. She does not feel any different except for lack of energy. She is compliant w/ Levothyroxine; no report of symptoms such as skin or hair changes, constipation or mental fogginess. She has some weight gain.  PMHx, Soc Hx and Fam Hx reviewed.  ROS: As per HPI; otherwise noncontributory.  O:  Filed Vitals:   11/22/12 1547  BP: 118/74  Pulse: 70  Temp: 98.5 F (36.9 C)  Resp: 16   GEN: In NAD; WN,WD. Pt is obese. Weight is up a few pounds. HENT: [place]/AT; EOMI w/ clear conj/sclerae. Otherwise unremarkable. COR: RRR. LUNGS: Normal resp rate and effort. SKIN: W&D; appears ruddy. NEURO: A&O x 3; CNs intact. Nonfocal.  A/P: Unspecified hypothyroidism - Continue current medication dose pending labs. Though 90-day supply was ordered, pharmacy only gave pt 30-day supply. Plan: TSH, T3, Free, T4, Free

## 2012-11-23 LAB — T4, FREE: Free T4: 1.59 ng/dL (ref 0.80–1.80)

## 2012-11-23 NOTE — Progress Notes (Signed)
Quick Note:  Please notify pt that results are normal.   Provide pt with copy of labs. ______ 

## 2013-01-22 ENCOUNTER — Other Ambulatory Visit: Payer: Self-pay | Admitting: Family Medicine

## 2013-01-25 ENCOUNTER — Ambulatory Visit (INDEPENDENT_AMBULATORY_CARE_PROVIDER_SITE_OTHER): Payer: BC Managed Care – PPO | Admitting: Family Medicine

## 2013-01-25 ENCOUNTER — Encounter: Payer: Self-pay | Admitting: Family Medicine

## 2013-01-25 VITALS — BP 132/81 | HR 63 | Temp 97.8°F | Resp 16 | Ht 66.0 in | Wt 222.0 lb

## 2013-01-25 DIAGNOSIS — R7989 Other specified abnormal findings of blood chemistry: Secondary | ICD-10-CM

## 2013-01-25 DIAGNOSIS — Z1159 Encounter for screening for other viral diseases: Secondary | ICD-10-CM

## 2013-01-25 LAB — HEPATIC FUNCTION PANEL
Alkaline Phosphatase: 41 U/L (ref 39–117)
Indirect Bilirubin: 0.6 mg/dL (ref 0.0–0.9)
Total Bilirubin: 0.7 mg/dL (ref 0.3–1.2)

## 2013-01-25 NOTE — Progress Notes (Addendum)
S:  This 52 y.o. Cauc female is here for follow-up re: hypothyroidism, on low dose medication for ~ 6 weeks. She has diet- controlled Type II DM w/ last A1c in May = 5.7%. She has no hypoglycemic episodes. Medication compliance is good. No new symptoms.  Pt has new grandson named Jean Rosenthal who has congenital cataracts and undiagnosed brain lesions.   Patient Active Problem List   Diagnosis Date Noted  . Obesity, Class II, BMI 35.0-39.9, with comorbidity (see actual BMI) 11/24/2011  . Perimenopausal 08/03/2011  . SUI (stress urinary incontinence, female) 08/03/2011  . Allergic rhinitis 08/03/2011  . 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 RLS (restless legs syndrome) 08/03/2011  . Hyperlipidemia   . Type 2 diabetes mellitus    PMHX, Soc Hx and Fam Hx reviewed.  Medications reconciled.  ROS: Negative for diaphoresis, fatigue, CP or tightness, palpitations, SOB or cough, GI problems, skin changes or pruritis, HA, dizziness, weakness, tremor or syncope. She has no behavior changes or dysphoric mood.  O: Filed Vitals:   01/25/13 0756  BP: 132/81  Pulse: 63  Temp: 97.8 F (36.6 C)  Resp: 16   GEN: In NAD; WN,WD. SKIN: W&D; intact w/o jaundice, rashes, erythema, lesions or pallor. COR: RRR. LUNGS: Normal resp rate and effort. BACK: No CVAT.No muscle tenderness or spasms. Spine is straight. ABD: Normal appearance except increased adipose tissue; soft w/o guarding. No RUQ tenderness or HSM. MS: MAEs; no joint effusions or deformities. NEURO: A&O x 3; CNs intact. Nonfocal.   A/P: Elevated LFTs - Suspect this may be due to "fatty liver" and associated w/ DM.   Plan: Hepatic Function Panel  Need for hepatitis C screening test - Plan: Hepatitis C antibody

## 2013-01-29 NOTE — Progress Notes (Signed)
Quick Note:  Please advise pt regarding following labs...  Liver function tests remain above normal. I looked back in your record and found an abdominal ultrasound done in 2010. You had 2 small gallstones and fatty infiltration of the liver (often seen with Diabetes). Test for Hepatitis C is negative. We may want to consider getting an ultrasound of your liver and gallbladder for an update of this situation. If you are having no GI symptoms, this issue can be addressed at your next visit.  Copy to pt. ______

## 2013-02-02 ENCOUNTER — Encounter: Payer: Self-pay | Admitting: Family Medicine

## 2013-03-01 ENCOUNTER — Ambulatory Visit: Payer: BC Managed Care – PPO | Admitting: Family Medicine

## 2013-03-30 ENCOUNTER — Ambulatory Visit (INDEPENDENT_AMBULATORY_CARE_PROVIDER_SITE_OTHER): Payer: BC Managed Care – PPO | Admitting: Family Medicine

## 2013-03-30 VITALS — BP 112/64 | HR 69 | Temp 98.1°F | Resp 16 | Ht 65.5 in | Wt 219.0 lb

## 2013-03-30 DIAGNOSIS — J309 Allergic rhinitis, unspecified: Secondary | ICD-10-CM

## 2013-03-30 DIAGNOSIS — J322 Chronic ethmoidal sinusitis: Secondary | ICD-10-CM

## 2013-03-30 DIAGNOSIS — R42 Dizziness and giddiness: Secondary | ICD-10-CM

## 2013-03-30 DIAGNOSIS — E119 Type 2 diabetes mellitus without complications: Secondary | ICD-10-CM

## 2013-03-30 LAB — POCT URINALYSIS DIPSTICK
Ketones, UA: NEGATIVE
Leukocytes, UA: NEGATIVE
Protein, UA: NEGATIVE
pH, UA: 6

## 2013-03-30 LAB — POCT CBC
HCT, POC: 42.5 % (ref 37.7–47.9)
Lymph, poc: 3 (ref 0.6–3.4)
MCHC: 31.8 g/dL (ref 31.8–35.4)
POC Granulocyte: 3.6 (ref 2–6.9)
POC LYMPH PERCENT: 42 %L (ref 10–50)
POC MID %: 8.5 %M (ref 0–12)
RDW, POC: 14.2 %

## 2013-03-30 MED ORDER — AMOXICILLIN 500 MG PO CAPS: 1000.0000 mg | ORAL_CAPSULE | Freq: Two times a day (BID) | ORAL | Status: DC

## 2013-03-30 MED ORDER — MECLIZINE HCL 25 MG PO TABS: 25.0000 mg | ORAL_TABLET | Freq: Three times a day (TID) | ORAL | Status: DC | PRN

## 2013-03-30 NOTE — Patient Instructions (Signed)
Vertigo Vertigo means you feel like you or your surroundings are moving when they are not. Vertigo can be dangerous if it occurs when you are at work, driving, or performing difficult activities.  CAUSES  Vertigo occurs when there is a conflict of signals sent to your brain from the visual and sensory systems in your body. There are many different causes of vertigo, including:  Infections, especially in the inner ear.  A bad reaction to a drug or misuse of alcohol and medicines.  Withdrawal from drugs or alcohol.  Rapidly changing positions, such as lying down or rolling over in bed.  A migraine headache.  Decreased blood flow to the brain.  Increased pressure in the brain from a head injury, infection, tumor, or bleeding. SYMPTOMS  You may feel as though the world is spinning around or you are falling to the ground. Because your balance is upset, vertigo can cause nausea and vomiting. You may have involuntary eye movements (nystagmus). DIAGNOSIS  Vertigo is usually diagnosed by physical exam. If the cause of your vertigo is unknown, your caregiver may perform imaging tests, such as an MRI scan (magnetic resonance imaging). TREATMENT  Most cases of vertigo resolve on their own, without treatment. Depending on the cause, your caregiver may prescribe certain medicines. If your vertigo is related to body position issues, your caregiver may recommend movements or procedures to correct the problem. In rare cases, if your vertigo is caused by certain inner ear problems, you may need surgery. HOME CARE INSTRUCTIONS   Follow your caregiver's instructions.  Avoid driving.  Avoid operating heavy machinery.  Avoid performing any tasks that would be dangerous to you or others during a vertigo episode.  Tell your caregiver if you notice that certain medicines seem to be causing your vertigo. Some of the medicines used to treat vertigo episodes can actually make them worse in some people. SEEK  IMMEDIATE MEDICAL CARE IF:   Your medicines do not relieve your vertigo or are making it worse.  You develop problems with talking, walking, weakness, or using your arms, hands, or legs.  You develop severe headaches.  Your nausea or vomiting continues or gets worse.  You develop visual changes.  A family member notices behavioral changes.  Your condition gets worse. MAKE SURE YOU:  Understand these instructions.  Will watch your condition.  Will get help right away if you are not doing well or get worse. Document Released: 02/04/2005 Document Revised: 07/20/2011 Document Reviewed: 11/13/2010 [employer] Patient Information 2014 [employer].  

## 2013-03-30 NOTE — Progress Notes (Signed)
[address]   [place], [place]  [address]   [phone] Subjective:    Patient ID: Alejandra Ray, female    DOB: 1961-02-04, 52 y.o.   MRN: [id number]  This chart was scribed for Ethelda Chick, MD by Blanchard Kelch, ED Scribe. The patient was seen in room 12. Patient's care was started at 7:59 PM.   HPI  Alejandra Ray is a 52 y.o. female who presents to office complaining of dizziness that began two days ago. She describes the feeling as "walking on a boat," like an unsteadiness. She took Nyquil last night, but denies any relief in the morning. The episodes last a few minutes. They are worse in the morning and subside throughout the day. She has associated headache, "back of the head congestion", nausea, and right ear tinnitus. She states that bending over brings on the dizziness. She denies that turning her head or laying down brings it on. She has a past history of vertigo that occurred 7 years ago. She was diagnosed with a sinus infection as the cause of the vertigo. She states that this dizziness feels different from the past episodes of vertigo.   She denies blurred or double vision, hearing loss, abnormal numbness or tingling in her extremities, chest pain, palpitations, shortness of breath, rhinorrhea, sore throat, cough, post nasal drip. She denies taking any new medications recently. Her glucose levels have been under control. They were 136 this morning.     Past Medical History  Diagnosis Date  . Diabetes mellitus   . Hyperlipidemia   . Type 2 diabetes mellitus   . Allergic rhinitis   . SUI (stress urinary incontinence), female    Past Surgical History  Procedure Laterality Date  . Cholecystectomy     No family history on file. Allergies  Allergen Reactions  . Codeine     Blacked out  . Sulfa Antibiotics Nausea And Vomiting   Current Outpatient Prescriptions on File Prior to Visit  Medication Sig Dispense Refill  . aspirin 81 MG tablet Take 81 mg by mouth  daily.      Marland Kitchen b complex-C-folic acid 1 MG capsule Take 1 capsule by mouth daily.      . cyclobenzaprine (FLEXERIL) 10 MG tablet TAKE 1 TABLET BY MOUTH AT BEDTIME AS NEEDED  30 tablet  3  . fish oil-omega-3 fatty acids 1000 MG capsule Take 2 g by mouth daily.      Marland Kitchen ipratropium (ATROVENT) 0.06 % nasal spray Place 2 sprays into the nose 4 (four) times daily.  15 mL  12  . levothyroxine (SYNTHROID, LEVOTHROID) 50 MCG tablet TAKE 1 TABLET (50 MCG TOTAL) BY MOUTH DAILY. *INSURANCE ALLOWS 30 DAYS*  90 tablet  0  . metFORMIN (GLUCOPHAGE) 500 MG tablet Take 1 tablet daily with your largest meal.  60 tablet  3  . milk thistle 175 MG tablet Take 175 mg by mouth daily.      . Multiple Vitamin (MULTIVITAMIN) capsule Take 1 capsule by mouth daily.      Marland Kitchen oxybutynin (DITROPAN-XL) 10 MG 24 hr tablet Take 10 mg by mouth daily.      . valACYclovir (VALTREX) 1000 MG tablet Take 1 tablet (1,000 mg total) by mouth 2 (two) times daily.  60 tablet  3   No current facility-administered medications on file prior to visit.   History   Social History  . Marital Status: Married    Spouse Name: N/A    Number of  Children: N/A  . Years of Education: N/A   Occupational History  . Not on file.   Social History Main Topics  . Smoking status: Former Smoker    Quit date: 05/13/1996  . Smokeless tobacco: Not on file  . Alcohol Use: Yes     Comment: occassionally  . Drug Use: Yes    Special: Marijuana     Comment: occassionally  . Sexual Activity: Not on file   Other Topics Concern  . Not on file   Social History Narrative  . No narrative on file     Review of Systems  Constitutional: Negative for fever.  HENT: Positive for congestion and tinnitus. Negative for drooling, postnasal drip, rhinorrhea and sore throat.   Eyes: Negative for discharge.  Respiratory: Negative for cough and shortness of breath.   Cardiovascular: Negative for chest pain, palpitations and leg swelling.  Gastrointestinal: Positive  for nausea. Negative for vomiting.  Endocrine: Negative for polyuria.  Genitourinary: Negative for hematuria.  Skin: Negative for rash.  Allergic/Immunologic: Negative for immunocompromised state.  Neurological: Positive for dizziness and headaches. Negative for speech difficulty and numbness.  Hematological: Negative for adenopathy.  Psychiatric/Behavioral: Negative for confusion.       Objective:   Physical Exam  Nursing note and vitals reviewed. Constitutional: She is oriented to person, place, and time. She appears well-developed and well-nourished. No distress.  HENT:  Head: Normocephalic and atraumatic.  Right Ear: Tympanic membrane, external ear and ear canal normal.  Left Ear: Tympanic membrane, external ear and ear canal normal.  Nose: No rhinorrhea. Right sinus exhibits frontal sinus tenderness.  Mouth/Throat: Oropharynx is clear and moist and mucous membranes are normal.  Eyes: EOM are normal.  Neck: Neck supple. No tracheal deviation present.  Cardiovascular: Normal rate and regular rhythm.  Exam reveals no gallop and no friction rub.   No murmur heard. Pulmonary/Chest: Effort normal. No respiratory distress. She has no wheezes. She has no rales.  Musculoskeletal: Normal range of motion. She exhibits no tenderness.  Neurological: She is alert and oriented to person, place, and time.  Dix-Hallpike negative for nystagmus but reproduces dizziness bilaterally.   Skin: Skin is warm and dry.  Psychiatric: She has a normal mood and affect. Her behavior is normal.   Results for orders placed in visit on 03/30/13  POCT CBC      Result Value Range   WBC 7.2  4.6 - 10.2 K/uL   Lymph, poc 3.0  0.6 - 3.4   POC LYMPH PERCENT 42.0  10 - 50 %L   MID (cbc) 0.6  0 - 0.9   POC MID % 8.5  0 - 12 %M   POC Granulocyte 3.6  2 - 6.9   Granulocyte percent 49.5  37 - 80 %G   RBC 4.57  4.04 - 5.48 M/uL   Hemoglobin 13.5  12.2 - 16.2 g/dL   HCT, POC 16.1  09.6 - 47.9 %   MCV 93.1  80 -  97 fL   MCH, POC 29.5  27 - 31.2 pg   MCHC 31.8  31.8 - 35.4 g/dL   RDW, POC 04.5     Platelet Count, POC 237  142 - 424 K/uL   MPV 10.5  0 - 99.8 fL  POCT URINALYSIS DIPSTICK      Result Value Range   Color, UA yellow     Clarity, UA clear     Glucose, UA neg     Bilirubin, UA neg  Ketones, UA neg     Spec Grav, UA 1.010     Blood, UA trace     pH, UA 6.0     Protein, UA neg     Urobilinogen, UA 0.2     Nitrite, UA neg     Leukocytes, UA Negative        Assessment & Plan:  Dizziness - Plan: POCT CBC, Comprehensive metabolic panel, TSH, EKG 12-Lead  Type II or unspecified type diabetes mellitus without mention of complication, not stated as uncontrolled - Plan: POCT CBC, POCT urinalysis dipstick, Comprehensive metabolic panel, TSH  Allergic rhinitis  Ethmoid sinusitis  1. Dizziness:  New.  Normal neurological exam in office.  Obtain labs.  Rx for Meclizine provided for PRN use.  RTC for acute worsening or lack of improvement in upcoming week. 2. DMII: controlled.  Obtain labs. 3.  Allergic Rhinitis: uncontrolled; recommend daily oral antihistamine and nasal steroid. 4.  Acute sinusitis:  New.  Rx for Amoxicillin provided.    Meds ordered this encounter  Medications  . DISCONTD: amoxicillin (AMOXIL) 500 MG capsule    Sig: Take 2 capsules (1,000 mg total) by mouth 2 (two) times daily.    Dispense:  40 capsule    Refill:  0  . DISCONTD: meclizine (ANTIVERT) 25 MG tablet    Sig: Take 1 tablet (25 mg total) by mouth 3 (three) times daily as needed for dizziness.    Dispense:  30 tablet    Refill:  0   I personally performed the services described in this documentation, which was scribed in my presence.  The recorded information has been reviewed and is accurate.  Nilda Simmer, M.D.  [facility] [address] [place], [place]  [address] [phone] phone [phone] fax

## 2013-03-31 LAB — COMPREHENSIVE METABOLIC PANEL
ALT: 60 U/L — ABNORMAL HIGH (ref 0–35)
Albumin: 4.8 g/dL (ref 3.5–5.2)
BUN: 14 mg/dL (ref 6–23)
CO2: 26 mEq/L (ref 19–32)
Calcium: 9.9 mg/dL (ref 8.4–10.5)
Creat: 0.65 mg/dL (ref 0.50–1.10)
Glucose, Bld: 94 mg/dL (ref 70–99)
Total Bilirubin: 0.8 mg/dL (ref 0.3–1.2)
Total Protein: 7.8 g/dL (ref 6.0–8.3)

## 2013-04-12 ENCOUNTER — Ambulatory Visit (INDEPENDENT_AMBULATORY_CARE_PROVIDER_SITE_OTHER): Payer: BC Managed Care – PPO | Admitting: Family Medicine

## 2013-04-12 VITALS — BP 129/85 | HR 74 | Temp 98.4°F | Resp 16 | Ht 67.0 in | Wt 226.0 lb

## 2013-04-12 DIAGNOSIS — J329 Chronic sinusitis, unspecified: Secondary | ICD-10-CM

## 2013-04-12 DIAGNOSIS — J3489 Other specified disorders of nose and nasal sinuses: Secondary | ICD-10-CM

## 2013-04-12 DIAGNOSIS — R0981 Nasal congestion: Secondary | ICD-10-CM

## 2013-04-12 NOTE — Patient Instructions (Signed)
I suggest you get a humidifier to use in the bedroom at night. You may want to get an air purifier and use that in the bedroom. You will be notified of the appointment for the CT scan then I will contact you with the results. I will recommend you see an ENT specialist.

## 2013-04-17 ENCOUNTER — Encounter: Payer: Self-pay | Admitting: Family Medicine

## 2013-04-17 ENCOUNTER — Ambulatory Visit
Admission: RE | Admit: 2013-04-17 | Discharge: 2013-04-17 | Disposition: A | Discharge status: Home / Self Care | Payer: BC Managed Care – PPO | Source: Ambulatory Visit | Attending: Family Medicine | Admitting: Family Medicine

## 2013-04-17 DIAGNOSIS — J329 Chronic sinusitis, unspecified: Secondary | ICD-10-CM

## 2013-04-17 NOTE — Progress Notes (Signed)
S:  This 52 y.o. Cauc female has a hx of recurrent sinus problems including infection recently treated 2-3 weeks ago w/ Amoxicillin and Meclizine. All symptoms have resolved except sinus pressure and congestion. She denies fever/chills, epistaxis, severe HAs, severe rhinorrhea or PND or prod cough. Pt has had recurrent problems since exposure to mold many years ago. She has not has ENT evaluation. She has wood stove heat so she increases humidity by having a kettle of water on stove. She has not tried NETI pot or OTC nasal sprays or mist. She uses Ipratropium nasal spray intermittently.  Patient Active Problem List   Diagnosis Date Noted  . Obesity, Class II, BMI 35.0-39.9, with comorbidity (see actual BMI) 11/24/2011  . Perimenopausal 08/03/2011  . SUI (stress urinary incontinence, female) 08/03/2011  . Allergic rhinitis 08/03/2011  . 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 RLS (restless legs syndrome) 08/03/2011  . Hyperlipidemia   . Type 2 diabetes mellitus    PMHx, Soc and Fam Hx reviewed.  Medications reconciled.  ROS: As per HPI.   O: Filed Vitals:   04/12/13 0928  BP: 129/85  Pulse: 74  Temp: 98.4 F (36.9 C)  Resp: 16   GEN: in NAD; WN,WD. HEENT: [place]/AT; EOMI w/ clear conj (mildly injected) and clear sclerae. EACs/TMs unremarkable. Nasal mucosa erythematous w/o rhinorrhea or bogginess. Post ph erythematous w/ cobblestoning; no lesions or exudate. Sinuses- mildly tender w/ percussion. NECK: Supple w/o LAN or TMG. COR: RRR. Normal S1 and S2. No m/g/r. LUNGS: CTA; no wheezes or rhonchi. Normal resp rate and effort. SKIN: W&D; facial ruddiness but no rashes or pallor. NEURO: A&O x 3; CNs intact. Nonfocal.  A/P: Chronic sinus infection - Plan: CT Maxillofacial WO CM  Sinus congestion- Use Ipratropium nasal spray daily. Try OTC saline nasal mist. Consider humidifier and air purifier for bedroom.  Will refer pt to ENT once CT results reviewed.

## 2013-04-18 NOTE — Progress Notes (Signed)
Quick Note:  Your recent imaging study is normal. The sinuses do not show any significant thickening of the lining that would indicate chronic sinus infections. You do not need to see a specialist at this time.  Contact the office if you have any questions or concerns. ______

## 2013-05-07 ENCOUNTER — Other Ambulatory Visit: Payer: Self-pay | Admitting: Physician Assistant

## 2013-06-04 ENCOUNTER — Other Ambulatory Visit: Payer: Self-pay | Admitting: Family Medicine

## 2013-06-29 ENCOUNTER — Ambulatory Visit: Payer: BC Managed Care – PPO | Admitting: Physician Assistant

## 2013-06-29 VITALS — BP 102/72 | HR 76 | Temp 97.8°F | Resp 18 | Ht 66.0 in | Wt 224.6 lb

## 2013-06-29 DIAGNOSIS — R05 Cough: Secondary | ICD-10-CM

## 2013-06-29 DIAGNOSIS — R059 Cough, unspecified: Secondary | ICD-10-CM

## 2013-06-29 DIAGNOSIS — J029 Acute pharyngitis, unspecified: Secondary | ICD-10-CM

## 2013-06-29 DIAGNOSIS — J329 Chronic sinusitis, unspecified: Secondary | ICD-10-CM

## 2013-06-29 MED ORDER — AMOXICILLIN-POT CLAVULANATE 875-125 MG PO TABS: 1.0000 | ORAL_TABLET | Freq: Two times a day (BID) | ORAL | Status: DC

## 2013-06-29 MED ORDER — BENZONATATE 100 MG PO CAPS: 100.0000 mg | ORAL_CAPSULE | Freq: Three times a day (TID) | ORAL | Status: DC | PRN

## 2013-06-29 MED ORDER — IPRATROPIUM BROMIDE 0.03 % NA SOLN: 2.0000 | Freq: Two times a day (BID) | NASAL | Status: DC

## 2013-06-29 NOTE — Patient Instructions (Signed)
Get plenty of rest and drink at least 64 ounces of water daily. 

## 2013-06-29 NOTE — Progress Notes (Signed)
Subjective:    Patient ID: Alejandra Ray, female    DOB: March 03, 1961, 53 y.o.   MRN: [id number]   PCP: [name], MD  Chief Complaint  Patient presents with  . Cough    X yesterday  . Sinus Congestion    Off and on for almost 2 months  . Chills    X yesterday  . Generalized Body Aches    X yesterday   Medications, allergies, past medical history, surgical history, family history, social history and problem list reviewed and updated.  HPI Has had sinus congestion, waxing and waning in severity, x 8 weeks.   CT sinuses in 04/2013 revealed mucosal thickening in the RIGHT maxillary sinus. Other sinuses were clear. Acupuncture last week helped x 4-5 days. Developed a sore throat and then last night developed muscle aches and cough, which kept her awake last night. Throat is "raw." Very red when she looked this morning. Chills, but no known fever. No nausea, vomiting or diarrhea-is actually a bit constipated-working on increasing her dietary fiber. Had a flu vaccine this season.  Review of Systems As above.    Objective:   Physical Exam  Vitals reviewed. Constitutional: She is oriented to person, place, and time. Vital signs are normal. She appears well-developed and well-nourished. No distress.  HENT:  Head: Normocephalic and atraumatic.  Right Ear: Hearing, tympanic membrane, external ear and ear canal normal.  Left Ear: Hearing, tympanic membrane, external ear and ear canal normal.  Nose: Mucosal edema and rhinorrhea present.  No foreign bodies. Right sinus exhibits maxillary sinus tenderness and frontal sinus tenderness. Left sinus exhibits maxillary sinus tenderness and frontal sinus tenderness.  Mouth/Throat: Uvula is midline, oropharynx is clear and moist and mucous membranes are normal. No uvula swelling. No oropharyngeal exudate, posterior oropharyngeal edema, posterior oropharyngeal erythema or tonsillar abscesses.  Eyes: Conjunctivae and EOM are normal.  Pupils are equal, round, and reactive to light. Right eye exhibits no discharge. Left eye exhibits no discharge. No scleral icterus.  Neck: Trachea normal, normal range of motion and full passive range of motion without pain. Neck supple. No mass and no thyromegaly present.  Cardiovascular: Normal rate, regular rhythm and normal heart sounds.   Pulmonary/Chest: Effort normal and breath sounds normal.  Lymphadenopathy:       Head (right side): No submandibular, no tonsillar, no preauricular, no posterior auricular and no occipital adenopathy present.       Head (left side): No submandibular, no tonsillar, no preauricular and no occipital adenopathy present.    She has no cervical adenopathy.       Right: No supraclavicular adenopathy present.       Left: No supraclavicular adenopathy present.  Neurological: She is alert and oriented to person, place, and time. She has normal strength. No cranial nerve deficit or sensory deficit.  Skin: Skin is warm, dry and intact. No rash noted.  Psychiatric: She has a normal mood and affect. Her speech is normal and behavior is normal.          Assessment & Plan:  1. Sinusitis Likely sub-acute until recently. - amoxicillin-clavulanate (AUGMENTIN) 875-125 MG per tablet; Take 1 tablet by mouth 2 (two) times daily.  Dispense: 20 tablet; Refill: 0 - ipratropium (ATROVENT) 0.03 % nasal spray; Place 2 sprays into both nostrils 2 (two) times daily.  Dispense: 30 mL; Refill: 0  2. Cough Secondary to #1. - benzonatate (TESSALON) 100 MG capsule; Take 1-2 capsules (100-200 mg total) by mouth 3 (three) times  daily as needed for cough.  Dispense: 40 capsule; Refill: 0  3. Acute pharyngitis Secondary to #1. Symptomatic treatment.  Return if symptoms worsen or fail to improve.  Fernande Brashelle S. Blessyn [place], PA-C Physician Assistant-Certified [facility]

## 2013-07-03 ENCOUNTER — Ambulatory Visit: Payer: BC Managed Care – PPO | Admitting: Emergency Medicine

## 2013-07-03 VITALS — BP 120/78 | HR 101 | Temp 98.5°F | Resp 16 | Ht 66.0 in | Wt 224.0 lb

## 2013-07-03 DIAGNOSIS — J018 Other acute sinusitis: Secondary | ICD-10-CM

## 2013-07-03 DIAGNOSIS — J209 Acute bronchitis, unspecified: Secondary | ICD-10-CM

## 2013-07-03 MED ORDER — LEVOFLOXACIN 500 MG PO TABS: 500.0000 mg | ORAL_TABLET | Freq: Every day | ORAL | Status: AC

## 2013-07-03 MED ORDER — PSEUDOEPHEDRINE-GUAIFENESIN ER 60-600 MG PO TB12
1.0000 | ORAL_TABLET | Freq: Two times a day (BID) | ORAL | Status: DC
Start: 2013-07-03 — End: 2013-07-18

## 2013-07-03 NOTE — Patient Instructions (Signed)

## 2013-07-03 NOTE — Progress Notes (Signed)
[facility] [address], [place] [id number] [phone]  Date:  07/03/2013   Name:  Alejandra BisCatherine P Ray   DOB:  06/05/1960   MRN:  [id number]  PCP:  Dow AdolphMCPHERSON,BARBARA, MD    Chief Complaint: Follow-up   History of Present Illness:  Alejandra Ray is a 53 y.o. very pleasant female patient who presents with the following:  Ill since around thanksgiving.  Was treated for sinusitis and again last week.  Treated most recently with augmentin and has not improved.  Has persistent purulent sputum and nasal drainage.  Fever and no chills.  Cough with wheezing at times and some exertional shortness of breath.  No nausea or vomiting.  Fatigue and malaise.  No improvement with over the counter medications or other home remedies. Denies other complaint or health concern today.   Patient Active Problem List   Diagnosis Date Noted  . Obesity, Class II, BMI 35.0-39.9, with comorbidity (see actual BMI) 11/24/2011  . Perimenopausal 08/03/2011  . SUI (stress urinary incontinence, female) 08/03/2011  . Allergic rhinitis 08/03/2011  . 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 RLS (restless legs syndrome) 08/03/2011  . Hyperlipidemia   . Type 2 diabetes mellitus     Past Medical History  Diagnosis Date  . Diabetes mellitus   . Hyperlipidemia   . Type 2 diabetes mellitus   . Allergic rhinitis   . SUI (stress urinary incontinence), female     Past Surgical History  Procedure Laterality Date  . Cholecystectomy      History  Substance Use Topics  . Smoking status: Former Smoker    Quit date: 05/13/1996  . Smokeless tobacco: Not on file  . Alcohol Use: Yes     Comment: occassionally    No family history on file.  Allergies  Allergen Reactions  . Codeine     Blacked out  . Sulfa Antibiotics Nausea And Vomiting    Medication list has been reviewed and updated.  Current Outpatient Prescriptions on File Prior to Visit  Medication Sig Dispense Refill  .  amoxicillin-clavulanate (AUGMENTIN) 875-125 MG per tablet Take 1 tablet by mouth 2 (two) times daily.  20 tablet  0  . aspirin 81 MG tablet Take 81 mg by mouth daily.      Marland Kitchen. b complex-C-folic acid 1 MG capsule Take 1 capsule by mouth daily.      . benzonatate (TESSALON) 100 MG capsule Take 1-2 capsules (100-200 mg total) by mouth 3 (three) times daily as needed for cough.  40 capsule  0  . cyclobenzaprine (FLEXERIL) 10 MG tablet TAKE 1 TABLET BY MOUTH AT BEDTIME AS NEEDED  30 tablet  3  . fish oil-omega-3 fatty acids 1000 MG capsule Take 2 g by mouth daily.      Marland Kitchen. ipratropium (ATROVENT) 0.03 % nasal spray Place 2 sprays into both nostrils 2 (two) times daily.  30 mL  0  . levothyroxine (SYNTHROID, LEVOTHROID) 50 MCG tablet Take 1 tablet (50 mcg total) by mouth daily.  30 tablet  2  . metFORMIN (GLUCOPHAGE) 500 MG tablet Take 1 tablet daily with your largest meal.  60 tablet  3  . milk thistle 175 MG tablet Take 175 mg by mouth daily.      . Multiple Vitamin (MULTIVITAMIN) capsule Take 1 capsule by mouth daily.      Marland Kitchen. oxybutynin (DITROPAN-XL) 10 MG 24 hr tablet Take 10 mg by mouth daily.      . valACYclovir (VALTREX)  1000 MG tablet Take 1 tablet (1,000 mg total) by mouth 2 (two) times daily.  60 tablet  3   No current facility-administered medications on file prior to visit.    Review of Systems:  As per HPI, otherwise negative.    Physical Examination: Filed Vitals:   07/03/13 1423  BP: 120/78  Pulse: 101  Temp: 98.5 F (36.9 C)  Resp: 16   Filed Vitals:   07/03/13 1423  Height: 5\' 6"  (1.676 m)  Weight: 224 lb (101.606 kg)   Body mass index is 36.17 kg/(m^2). Ideal Body Weight: Weight in (lb) to have BMI = 25: 154.6  GEN: WDWN, NAD, Non-toxic, A & O x 3 HEENT: Atraumatic, Normocephalic. Neck supple. No masses, No LAD. Ears and Nose: No external deformity. CV: RRR, No M/G/R. No JVD. No thrill. No extra heart sounds. PULM: CTA B, no wheezes, crackles, rhonchi. No retractions.  No resp. distress. No accessory muscle use. ABD: S, NT, ND, +BS. No rebound. No HSM. EXTR: No c/c/e NEURO Normal gait.  PSYCH: Normally interactive. Conversant. Not depressed or anxious appearing.  Calm demeanor.    Assessment and Plan: Sinusitis Bronchitis mucinex d Phen c cod levaquin   Signed,  Phillips Odor, MD

## 2013-07-18 ENCOUNTER — Ambulatory Visit: Payer: BC Managed Care – PPO | Admitting: Emergency Medicine

## 2013-07-18 ENCOUNTER — Ambulatory Visit: Payer: BC Managed Care – PPO

## 2013-07-18 VITALS — BP 122/75 | HR 68 | Temp 98.5°F | Resp 18 | Wt 225.0 lb

## 2013-07-18 DIAGNOSIS — S3210XA Unspecified fracture of sacrum, initial encounter for closed fracture: Secondary | ICD-10-CM

## 2013-07-18 DIAGNOSIS — M549 Dorsalgia, unspecified: Secondary | ICD-10-CM

## 2013-07-18 DIAGNOSIS — S322XXA Fracture of coccyx, initial encounter for closed fracture: Secondary | ICD-10-CM

## 2013-07-18 MED ORDER — HYDROCODONE-ACETAMINOPHEN 5-325 MG PO TABS: 1.0000 | ORAL_TABLET | ORAL | Status: DC | PRN

## 2013-07-18 MED ORDER — POLYETHYLENE GLYCOL 3350 17 GM/SCOOP PO POWD: 17.0000 g | Freq: Every day | ORAL | Status: DC

## 2013-07-18 NOTE — Patient Instructions (Signed)
Coccyx Tailbone Injury The tailbone (coccyx) is the small bone at the lower end of the spine. A tailbone injury may involve stretched ligaments, bruising, or a broken bone (fracture). Women are more vulnerable to this injury due to having a wider pelvis. CAUSES  This type of injury typically occurs from falling and landing on the tailbone. Repeated strain or friction from actions such as rowing and bicycling may also injure the area. The tailbone can be injured during childbirth. Infections or tumors may also press on the tailbone and cause pain. Sometimes, the cause of injury is unknown. SYMPTOMS   Bruising.  Pain when sitting.  Painful bowel movements.  In women, pain during intercourse. DIAGNOSIS  Your caregiver can diagnose a tailbone injury based on your symptoms and a physical exam. X-rays may be taken if a fracture is suspected. Your caregiver may also use an MRI scan imaging test to evaluate your symptoms. TREATMENT  Your caregiver may prescribe medicines to help relieve your pain. Most tailbone injuries heal on their own in 4 to 6 weeks. However, if the injury is caused by an infection or tumor, the recovery period may vary. PREVENTION  Wear appropriate padding and sports gear when bicycling and rowing. This can help prevent an injury from repeated strain or friction. HOME CARE INSTRUCTIONS   Put ice on the injured area.  Put ice in a plastic bag.  Place a towel between your skin and the bag.  Leave the ice on for 15-20 minutes, every hour while awake for the first 1 to 2 days.  Sit on a large, rubber or inflated ring or cushion to ease your pain. Lean forward when sitting to help decrease discomfort.  Avoid sitting for long periods of time.  Increase your activity as the pain allows.  Only take over-the-counter or prescription medicines for pain, discomfort, or fever as directed by your caregiver.  You may use stool softeners if it is painful to have a bowel movement,  or as directed by your caregiver.  Eat a diet with plenty of fiber to help prevent constipation.  Keep all follow-up appointments as directed by your caregiver. SEEK MEDICAL CARE IF:   Your pain becomes worse.  Your bowel movements cause a great deal of discomfort.  You are unable to have a bowel movement.  You have a fever. MAKE SURE YOU:  Understand these instructions.  Will watch your condition.  Will get help right away if you are not doing well or get worse. Document Released: 04/24/2000 Document Revised: 07/20/2011 Document Reviewed: 11/20/2010 [facility] Patient Information 2014 [place], [place].

## 2013-07-18 NOTE — Progress Notes (Signed)
[facility] [address] [place] [address] [phone]  Date:  07/18/2013   Name:  Alejandra Ray   DOB:  1960-11-16   MRN:  [id number]  PCP:  [name], MD    Chief Complaint: Fall   History of Present Illness:  Alejandra Ray is a 53 y.o. very pleasant female patient who presents with the following:  Slipped and fell on a step and landed on her behind at her son's house.  Has severe coccyx pain when sits.  Some while standing.  No back pain. No neuro symptoms or radiation of pain.  No improvement with over the counter medications or other home remedies. Denies other complaint or health concern today.   Patient Active Problem List   Diagnosis Date Noted  . Obesity, Class II, BMI 35.0-39.9, with comorbidity (see actual BMI) 11/24/2011  . Perimenopausal 08/03/2011  . SUI (stress urinary incontinence, female) 08/03/2011  . Allergic rhinitis 08/03/2011  . 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 RLS (restless legs syndrome) 08/03/2011  . Hyperlipidemia   . Type 2 diabetes mellitus     Past Medical History  Diagnosis Date  . Diabetes mellitus   . Hyperlipidemia   . Type 2 diabetes mellitus   . Allergic rhinitis   . SUI (stress urinary incontinence), female     Past Surgical History  Procedure Laterality Date  . Cholecystectomy      History  Substance Use Topics  . Smoking status: Former Smoker    Quit date: 05/13/1996  . Smokeless tobacco: Not on file  . Alcohol Use: Yes     Comment: occassionally    No family history on file.  Allergies  Allergen Reactions  . Codeine     Blacked out  . Sulfa Antibiotics Nausea And Vomiting    Medication list has been reviewed and updated.  Current Outpatient Prescriptions on File Prior to Visit  Medication Sig Dispense Refill  . aspirin 81 MG tablet Take 81 mg by mouth daily.      Marland Kitchen b complex-C-folic acid 1 MG capsule Take 1 capsule by mouth daily.      . cyclobenzaprine (FLEXERIL) 10 MG  tablet TAKE 1 TABLET BY MOUTH AT BEDTIME AS NEEDED  30 tablet  3  . fish oil-omega-3 fatty acids 1000 MG capsule Take 2 g by mouth daily.      Marland Kitchen ipratropium (ATROVENT) 0.03 % nasal spray Place 2 sprays into both nostrils 2 (two) times daily.  30 mL  0  . levothyroxine (SYNTHROID, LEVOTHROID) 50 MCG tablet Take 1 tablet (50 mcg total) by mouth daily.  30 tablet  2  . metFORMIN (GLUCOPHAGE) 500 MG tablet Take 1 tablet daily with your largest meal.  60 tablet  3  . milk thistle 175 MG tablet Take 175 mg by mouth daily.      . Multiple Vitamin (MULTIVITAMIN) capsule Take 1 capsule by mouth daily.      Marland Kitchen oxybutynin (DITROPAN-XL) 10 MG 24 hr tablet Take 10 mg by mouth daily.      . valACYclovir (VALTREX) 1000 MG tablet Take 1 tablet (1,000 mg total) by mouth 2 (two) times daily.  60 tablet  3   No current facility-administered medications on file prior to visit.    Review of Systems:  As per HPI, otherwise negative.    Physical Examination: Filed Vitals:   07/18/13 1454  BP: 122/75  Pulse: 68  Temp: 98.5 F (36.9 C)  Resp: 18  Filed Vitals:   07/18/13 1454  Weight: 225 lb (102.059 kg)   Body mass index is 36.33 kg/(m^2). Ideal Body Weight:     GEN: WDWN, NAD, Non-toxic, Alert & Oriented x 3 HEENT: Atraumatic, Normocephalic.  Ears and Nose: No external deformity. EXTR: No clubbing/cyanosis/edema NEURO: Normal gait.  PSYCH: Normally interactive. Conversant. Not depressed or anxious appearing.  Calm demeanor.  BACK:  Not tender.  Very tender over coccyx.  Neuro intact.  Assessment and Plan: Fractured coccyx vicodin miralax  Signed,  Phillips OdorJeffery Jowanda Heeg, MD   [facility] reading (PRIMARY) by  Dr. Dareen PianoAnderson  Fracture coccyx..Marland Kitchen

## 2013-07-24 ENCOUNTER — Ambulatory Visit: Payer: BC Managed Care – PPO | Admitting: Emergency Medicine

## 2013-07-24 VITALS — BP 108/74 | HR 54 | Temp 98.4°F | Resp 18 | Wt 225.0 lb

## 2013-07-24 DIAGNOSIS — S3210XA Unspecified fracture of sacrum, initial encounter for closed fracture: Secondary | ICD-10-CM

## 2013-07-24 DIAGNOSIS — S322XXA Fracture of coccyx, initial encounter for closed fracture: Secondary | ICD-10-CM

## 2013-07-24 MED ORDER — HYDROCODONE-ACETAMINOPHEN 5-325 MG PO TABS: 1.0000 | ORAL_TABLET | ORAL | Status: DC | PRN

## 2013-07-24 NOTE — Progress Notes (Signed)
[facility] [address], [place] [id number] [phone]  Date:  07/24/2013   Name:  Alejandra BisCatherine P Stofer   DOB:  12/02/1960   MRN:  [id number]  PCP:  Dow AdolphMCPHERSON,BARBARA, MD    Chief Complaint: tailbone fx   History of Present Illness:  Alejandra Ray is a 53 y.o. very pleasant female patient who presents with the following:  Follow up for fracture of the coccyx.  Not able to return to work at this point due to pain with movement. 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  Eating and stooling without problem.  Tolerating medications well.  No improvement with over the counter medications or other home remedies. Denies other complaint or health concern today.   Patient Active Problem List   Diagnosis Date Noted  . Obesity, Class II, BMI 35.0-39.9, with comorbidity (see actual BMI) 11/24/2011  . Perimenopausal 08/03/2011  . SUI (stress urinary incontinence, female) 08/03/2011  . Allergic rhinitis 08/03/2011  . 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 RLS (restless legs syndrome) 08/03/2011  . Hyperlipidemia   . Type 2 diabetes mellitus     Past Medical History  Diagnosis Date  . Diabetes mellitus   . Hyperlipidemia   . Type 2 diabetes mellitus   . Allergic rhinitis   . SUI (stress urinary incontinence), female     Past Surgical History  Procedure Laterality Date  . Cholecystectomy      History  Substance Use Topics  . Smoking status: Former Smoker    Quit date: 05/13/1996  . Smokeless tobacco: Not on file  . Alcohol Use: Yes     Comment: occassionally    No family history on file.  Allergies  Allergen Reactions  . Codeine     Blacked out  . Sulfa Antibiotics Nausea And Vomiting    Medication list has been reviewed and updated.  Current Outpatient Prescriptions on File Prior to Visit  Medication Sig Dispense Refill  . aspirin 81 MG tablet Take 81 mg by mouth daily.      Marland Kitchen. b complex-C-folic acid 1 MG capsule Take 1 capsule by mouth daily.       . cyclobenzaprine (FLEXERIL) 10 MG tablet TAKE 1 TABLET BY MOUTH AT BEDTIME AS NEEDED  30 tablet  3  . fish oil-omega-3 fatty acids 1000 MG capsule Take 2 g by mouth daily.      Marland Kitchen. HYDROcodone-acetaminophen (NORCO) 5-325 MG per tablet Take 1-2 tablets by mouth every 4 (four) hours as needed for moderate pain.  40 tablet  0  . ipratropium (ATROVENT) 0.03 % nasal spray Place 2 sprays into both nostrils 2 (two) times daily.  30 mL  0  . levothyroxine (SYNTHROID, LEVOTHROID) 50 MCG tablet Take 1 tablet (50 mcg total) by mouth daily.  30 tablet  2  . metFORMIN (GLUCOPHAGE) 500 MG tablet Take 1 tablet daily with your largest meal.  60 tablet  3  . milk thistle 175 MG tablet Take 175 mg by mouth daily.      . Multiple Vitamin (MULTIVITAMIN) capsule Take 1 capsule by mouth daily.      Marland Kitchen. oxybutynin (DITROPAN-XL) 10 MG 24 hr tablet Take 10 mg by mouth daily.      . polyethylene glycol powder (GLYCOLAX/MIRALAX) powder Take 17 g by mouth daily.  3350 g  1  . valACYclovir (VALTREX) 1000 MG tablet Take 1 tablet (1,000 mg total) by mouth 2 (two) times daily.  60 tablet  3  No current facility-administered medications on file prior to visit.    Review of Systems:  As per HPI, otherwise negative.    Physical Examination: Filed Vitals:   07/24/13 1309  BP: 108/74  Pulse: 54  Temp: 98.4 F (36.9 C)  Resp: 18   Filed Vitals:   07/24/13 1309  Weight: 225 lb (102.059 kg)   Body mass index is 36.33 kg/(m^2). Ideal Body Weight:     GEN: WDWN, NAD, Non-toxic, Alert & Oriented x 3 HEENT: Atraumatic, Normocephalic.  Ears and Nose: No external deformity. EXTR: No clubbing/cyanosis/edema NEURO: Normal gait.  PSYCH: Normally interactive. Conversant. Not depressed or anxious appearing.  Calm demeanor.    Assessment and Plan: Fracture coccyx Continue OOW   Signed,  Phillips Odor, MD

## 2013-07-31 ENCOUNTER — Telehealth: Payer: Self-pay

## 2013-07-31 ENCOUNTER — Encounter: Payer: Self-pay | Admitting: *Deleted

## 2013-07-31 NOTE — Telephone Encounter (Signed)
Note written- ok per last OV Pt notified- Husband will P/U

## 2013-07-31 NOTE — Telephone Encounter (Signed)
PT STATES SHE HAVE A BROKEN TAIL BONE AND WAS TO GO BACK TO WORK TODAY BUT ISN'T ABLE TO. WOULD LIKE AN OOW NOTE FOR THE REST OF THE WEEK ALSO PLEASE CALL [phone]

## 2013-08-06 ENCOUNTER — Encounter: Payer: Self-pay | Admitting: *Deleted

## 2013-08-06 ENCOUNTER — Telehealth: Payer: Self-pay

## 2013-08-06 NOTE — Telephone Encounter (Signed)
Pt broke her tailbone about three weeks ago.  Alejandra Ray had her out of work, but she is supposed to go back tomorrow.  She says she still is having a lot of pain and discomfort.  Says she cant even sit down well.  Can we extend her absence from work or does she need to come back in to be evalulated? [phone]

## 2013-08-06 NOTE — Telephone Encounter (Signed)
Advised pt that we will give her 1 more week but she will need to return if no better after this week.

## 2013-08-11 ENCOUNTER — Ambulatory Visit: Payer: BC Managed Care – PPO | Admitting: Family Medicine

## 2013-08-11 ENCOUNTER — Encounter: Payer: Self-pay | Admitting: Family Medicine

## 2013-08-11 VITALS — BP 130/72 | HR 60 | Temp 98.0°F | Resp 16 | Ht 66.0 in | Wt 223.6 lb

## 2013-08-11 DIAGNOSIS — E119 Type 2 diabetes mellitus without complications: Secondary | ICD-10-CM

## 2013-08-11 DIAGNOSIS — S322XXA Fracture of coccyx, initial encounter for closed fracture: Secondary | ICD-10-CM

## 2013-08-11 LAB — BASIC METABOLIC PANEL
BUN: 12 mg/dL (ref 6–23)
CHLORIDE: 101 meq/L (ref 96–112)
CO2: 19 meq/L (ref 19–32)
CREATININE: 0.7 mg/dL (ref 0.50–1.10)
Calcium: 9.9 mg/dL (ref 8.4–10.5)
GLUCOSE: 144 mg/dL — AB (ref 70–99)
Potassium: 4.4 mEq/L (ref 3.5–5.3)
Sodium: 133 mEq/L — ABNORMAL LOW (ref 135–145)

## 2013-08-11 LAB — POCT GLYCOSYLATED HEMOGLOBIN (HGB A1C): Hemoglobin A1C: 6.2

## 2013-08-11 NOTE — Patient Instructions (Signed)
I have given you an out of work for additional 2 weeks. If you need pain medication before next visit, have pharmacy contact us.

## 2013-08-11 NOTE — Progress Notes (Signed)
Subjective:    Patient ID: Alejandra Ray, female    DOB: 08/22/1960, 53 y.o.   MRN: [id number]  HPI  This 53 y.o. Cauc female is here for Type II DM follow-up. FSBS are "good". She is compliant with medications w/o adverse effects. Pt has been consistent about nutrition and has managed to lose some weight. She has been stressed about grandchild who has cerebral palsy and other health challenges. She denies hypoglycemic episodes.   Pt has been out of work w/ a fractured coccyx since July 18, 2013. She was eval at 89 after falling down steps and landing on rear end. Has persistent tailbone pain, increased w/ sitting. 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 She still has to take prescription pain medication. No problems w/ toilet habits. No numbness or weakness in buttocks or legs.  Patient Active Problem List   Diagnosis Date Noted  . Obesity, Class II, BMI 35.0-39.9, with comorbidity (see actual BMI) 11/24/2011  . Perimenopausal 08/03/2011  . SUI (stress urinary incontinence, female) 08/03/2011  . Allergic rhinitis 08/03/2011  . 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 RLS (restless legs syndrome) 08/03/2011  . Hyperlipidemia   . Type 2 diabetes mellitus    Prior to Admission medications   Medication Sig Start Date End Date Taking? Authorizing Provider  aspirin 81 MG tablet Take 81 mg by mouth daily.   Yes Historical Provider, MD  b complex-C-folic acid 1 MG capsule Take 1 capsule by mouth daily.   Yes Historical Provider, MD  cyclobenzaprine (FLEXERIL) 10 MG tablet TAKE 1 TABLET BY MOUTH AT BEDTIME AS NEEDED 09/22/12  Yes Maurice MarchBarbara B Margia Wiesen, MD  fish oil-omega-3 fatty acids 1000 MG capsule Take 2 g by mouth daily.   Yes Historical Provider, MD  HYDROcodone-acetaminophen (NORCO) 5-325 MG per tablet Take 1-2 tablets by mouth every 4 (four) hours as needed for moderate pain. 07/24/13  Yes Phillips OdorJeffery Anderson, MD  ipratropium (ATROVENT) 0.03 % nasal spray Place 2 sprays into both  nostrils 2 (two) times daily. 06/29/13  Yes Chelle S Jeffery, PA-C  levothyroxine (SYNTHROID, LEVOTHROID) 50 MCG tablet Take 1 tablet (50 mcg total) by mouth daily. 06/04/13  Yes Maurice MarchBarbara B Aayansh Codispoti, MD  metFORMIN (GLUCOPHAGE) 500 MG tablet Take 1 tablet daily with your largest meal. 09/22/12  Yes Maurice MarchBarbara B Kelby Adell, MD  milk thistle 175 MG tablet Take 175 mg by mouth daily.   Yes Historical Provider, MD  Multiple Vitamin (MULTIVITAMIN) capsule Take 1 capsule by mouth daily.   Yes Historical Provider, MD  oxybutynin (DITROPAN-XL) 10 MG 24 hr tablet Take 10 mg by mouth daily.   Yes Historical Provider, MD  valACYclovir (VALTREX) 1000 MG tablet Take 1 tablet (1,000 mg total) by mouth 2 (two) times daily. 09/22/12  Yes Maurice MarchBarbara B Tanush Drees, MD   PMHx, Surg Hx, Soc and Fam Hx reviewed.   Review of Systems As per HPI.     Objective:   Physical Exam  Nursing note and vitals reviewed. Constitutional: She is oriented to person, place, and time. She appears well-developed and well-nourished. No distress.  HENT:  Head: Normocephalic and atraumatic.  Right Ear: External ear normal.  Left Ear: External ear normal.  Nose: Nose normal.  Mouth/Throat: Oropharynx is clear and moist.  Eyes: Conjunctivae and EOM are normal. Pupils are equal, round, and reactive to light. No scleral icterus.  Cardiovascular: Normal rate and regular rhythm.   Pulmonary/Chest: Effort normal. No respiratory distress.  Musculoskeletal: Normal range of motion. She exhibits  no edema.       Cervical back: Normal.       Lumbar back: Normal.  Coccyx- tender w/ deep palpation. Forward flexion to 90 degrees. (I reviewed xrays w/ pt).  Neurological: She is alert and oriented to person, place, and time. She displays no atrophy. No cranial nerve deficit or sensory deficit. She exhibits normal muscle tone. Coordination and gait normal.  Skin: Skin is warm, dry and intact. She is not diaphoretic.  See DM Foot exam.  Psychiatric: Her  speech is normal and behavior is normal. Thought content normal. Her mood appears anxious. Her affect is not angry and not inappropriate. She does not exhibit a depressed mood.    Results for orders placed in visit on 08/11/13  POCT GLYCOSYLATED HEMOGLOBIN (HGB A1C)      Result Value Ref Range   Hemoglobin A1C 6.2        Assessment & Plan:  Type II or unspecified type diabetes mellitus without mention of complication, not stated as uncontrolled - Continue current medications. Focus on better nutrition.  Plan: HM Diabetes Foot Exam, Basic metabolic panel, POCT glycosylated hemoglobin (Hb A1C)  Fractured coccyx- Note OOW; return for recheck in 2 weeks.

## 2013-08-12 NOTE — Progress Notes (Signed)
Quick Note:  Please advise pt regarding following labs... Blood sugar is above normal as expected; that is why sodium is below normal. Kidney function and potassium are normal.  Copy to pt. ______

## 2013-08-13 ENCOUNTER — Encounter: Payer: Self-pay | Admitting: Family Medicine

## 2013-08-17 ENCOUNTER — Telehealth: Payer: Self-pay | Admitting: Family Medicine

## 2013-08-17 NOTE — Telephone Encounter (Signed)
Spoke with patient and she would like to get more pain medicine if possible

## 2013-08-18 MED ORDER — HYDROCODONE-ACETAMINOPHEN 5-325 MG PO TABS: 1.0000 | ORAL_TABLET | ORAL | Status: DC | PRN

## 2013-08-18 NOTE — Telephone Encounter (Signed)
Hydrocodone-APAP refill printed. Pt can pick up RX on Saturday afternoon (08/19/13) at [facility]. Signature required.

## 2013-08-19 NOTE — Telephone Encounter (Signed)
Called pt, advised her Rx ready to pick up.

## 2013-08-21 ENCOUNTER — Other Ambulatory Visit: Payer: Self-pay | Admitting: Family Medicine

## 2013-08-23 ENCOUNTER — Encounter: Payer: Self-pay | Admitting: *Deleted

## 2013-08-23 DIAGNOSIS — Z1231 Encounter for screening mammogram for malignant neoplasm of breast: Secondary | ICD-10-CM

## 2013-08-23 DIAGNOSIS — Z1239 Encounter for other screening for malignant neoplasm of breast: Secondary | ICD-10-CM | POA: Insufficient documentation

## 2013-08-25 ENCOUNTER — Ambulatory Visit (INDEPENDENT_AMBULATORY_CARE_PROVIDER_SITE_OTHER): Payer: BC Managed Care – PPO | Admitting: Family Medicine

## 2013-08-25 ENCOUNTER — Encounter: Payer: Self-pay | Admitting: Family Medicine

## 2013-08-25 VITALS — BP 126/82 | HR 73 | Temp 97.5°F | Resp 16 | Ht 66.5 in | Wt 224.0 lb

## 2013-08-25 DIAGNOSIS — S322XXA Fracture of coccyx, initial encounter for closed fracture: Principal | ICD-10-CM

## 2013-08-25 DIAGNOSIS — S3210XA Unspecified fracture of sacrum, initial encounter for closed fracture: Secondary | ICD-10-CM

## 2013-08-26 NOTE — Progress Notes (Signed)
S:  This 53 y.o. Cauc female returns for recheck and note to return to work. She fell and fractured her coccyx 1 month ago. She has mild tenderness w/ prolonged sitting but otherwise has no limitations of activity. She walks and moves w/o pain. No radicular pain reported in buttocks or into thigh area/back of legs.  She would like to RTW but limit to 40 hours for a few weeks. Job duties are strenuous and she would like to have weekends to rest rather than work overtime.  Patient Active Problem List   Diagnosis Date Noted  . Other screening mammogram 08/23/2013  . Obesity, Class II, BMI 35.0-39.9, with comorbidity (see actual BMI) 11/24/2011  . Perimenopausal 08/03/2011  . SUI (stress urinary incontinence, female) 08/03/2011  . Allergic rhinitis 08/03/2011  . 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 RLS (restless legs syndrome) 08/03/2011  . Hyperlipidemia   . Type 2 diabetes mellitus    PMHx, Surg Hx and Soc Hx reviewed.    MEDICATIONS reconciled. Using pain medication sparingly.  ROS: As per HPI.  O: Filed Vitals:   08/25/13 1144  BP: 126/82  Pulse: 73  Temp: 97.5 F (36.4 C)  Resp: 16   GEN: In NAD: WN,WD. HENT: [place]/AT; EOMI w/ clear conj/sclerae. Otherwise unremarkable. BACK: Spine is straight w/o deformity or bony tenderness. Mildly tender w/ deep palpation near tip of coccyx. Forward flexion to 90 degrees. NEURO: A&O x 3; CNs intact. Gait is normal. Nonfocal.  A/P: Closed fracture of sacrum and coccyx without mention of spinal cord injury RTW note with pt restricted to 40 -hour weeks until October 09, 2013.

## 2013-09-04 ENCOUNTER — Other Ambulatory Visit: Payer: Self-pay | Admitting: Family Medicine

## 2013-09-04 NOTE — Telephone Encounter (Signed)
Levothyroxine refilled for 6 months.

## 2013-09-04 NOTE — Telephone Encounter (Signed)
Dr Audria NineMcPherson, pt saw you for DM check up this month but not for thyroid, last TSH normal in 03/2013. Can we give RF?

## 2013-09-10 ENCOUNTER — Other Ambulatory Visit: Payer: Self-pay | Admitting: Family Medicine

## 2013-09-15 ENCOUNTER — Encounter: Payer: Self-pay | Admitting: Family Medicine

## 2013-09-18 ENCOUNTER — Other Ambulatory Visit: Payer: Self-pay | Admitting: Family Medicine

## 2013-10-12 ENCOUNTER — Other Ambulatory Visit: Payer: Self-pay | Admitting: Physician Assistant

## 2013-10-13 ENCOUNTER — Other Ambulatory Visit: Payer: Self-pay | Admitting: Family Medicine

## 2013-10-15 ENCOUNTER — Other Ambulatory Visit: Payer: Self-pay | Admitting: Family Medicine

## 2014-01-24 ENCOUNTER — Ambulatory Visit (INDEPENDENT_AMBULATORY_CARE_PROVIDER_SITE_OTHER): Payer: BC Managed Care – PPO | Admitting: Family Medicine

## 2014-01-24 ENCOUNTER — Encounter: Payer: Self-pay | Admitting: Family Medicine

## 2014-01-24 VITALS — BP 118/76 | HR 68 | Temp 98.2°F | Resp 16 | Ht 66.0 in | Wt 219.2 lb

## 2014-01-24 DIAGNOSIS — L919 Hypertrophic disorder of the skin, unspecified: Secondary | ICD-10-CM

## 2014-01-24 DIAGNOSIS — E785 Hyperlipidemia, unspecified: Secondary | ICD-10-CM

## 2014-01-24 DIAGNOSIS — G2581 Restless legs syndrome: Secondary | ICD-10-CM

## 2014-01-24 DIAGNOSIS — Z1211 Encounter for screening for malignant neoplasm of colon: Secondary | ICD-10-CM

## 2014-01-24 DIAGNOSIS — R0683 Snoring: Secondary | ICD-10-CM

## 2014-01-24 DIAGNOSIS — E039 Hypothyroidism, unspecified: Secondary | ICD-10-CM

## 2014-01-24 DIAGNOSIS — R0989 Other specified symptoms and signs involving the circulatory and respiratory systems: Secondary | ICD-10-CM

## 2014-01-24 DIAGNOSIS — E119 Type 2 diabetes mellitus without complications: Secondary | ICD-10-CM

## 2014-01-24 DIAGNOSIS — L918 Other hypertrophic disorders of the skin: Secondary | ICD-10-CM

## 2014-01-24 DIAGNOSIS — L909 Atrophic disorder of skin, unspecified: Secondary | ICD-10-CM

## 2014-01-24 DIAGNOSIS — R0609 Other forms of dyspnea: Secondary | ICD-10-CM

## 2014-01-24 DIAGNOSIS — Z0000 Encounter for general adult medical examination without abnormal findings: Secondary | ICD-10-CM

## 2014-01-24 LAB — LIPID PANEL
CHOL/HDL RATIO: 4.4 ratio
Cholesterol: 185 mg/dL (ref 0–200)
HDL: 42 mg/dL (ref >39)
LDL CALC: 108 mg/dL — AB (ref 0–99)
Triglycerides: 176 mg/dL — ABNORMAL HIGH (ref <150)
VLDL: 35 mg/dL (ref 0–40)

## 2014-01-24 LAB — COMPLETE METABOLIC PANEL WITH GFR
ALT: 51 U/L — ABNORMAL HIGH (ref 0–35)
AST: 28 U/L (ref 0–37)
Albumin: 4.7 g/dL (ref 3.5–5.2)
Alkaline Phosphatase: 47 U/L (ref 39–117)
BUN: 13 mg/dL (ref 6–23)
CALCIUM: 9.5 mg/dL (ref 8.4–10.5)
CHLORIDE: 103 meq/L (ref 96–112)
CO2: 23 meq/L (ref 19–32)
Creat: 0.72 mg/dL (ref 0.50–1.10)
Glucose, Bld: 119 mg/dL — ABNORMAL HIGH (ref 70–99)
Potassium: 4.5 mEq/L (ref 3.5–5.3)
Sodium: 136 mEq/L (ref 135–145)
Total Bilirubin: 0.7 mg/dL (ref 0.2–1.2)
Total Protein: 7.6 g/dL (ref 6.0–8.3)

## 2014-01-24 LAB — THYROID PANEL WITH TSH
Free Thyroxine Index: 2.4 (ref 1.4–3.8)
T3 UPTAKE: 25 % (ref 22.0–35.0)
T4 TOTAL: 9.4 ug/dL (ref 4.5–12.0)
TSH: 3.789 u[IU]/mL (ref 0.350–4.500)

## 2014-01-24 LAB — POCT GLYCOSYLATED HEMOGLOBIN (HGB A1C): HEMOGLOBIN A1C: 6

## 2014-01-24 MED ORDER — VALACYCLOVIR HCL 1 G PO TABS: 1000.0000 mg | ORAL_TABLET | Freq: Two times a day (BID) | ORAL | Status: DC

## 2014-01-24 MED ORDER — METFORMIN HCL 500 MG PO TABS: ORAL_TABLET | ORAL | Status: DC

## 2014-01-24 MED ORDER — CYCLOBENZAPRINE HCL 10 MG PO TABS: ORAL_TABLET | ORAL | Status: DC

## 2014-01-24 MED ORDER — OXYBUTYNIN CHLORIDE ER 10 MG PO TB24: 10.0000 mg | ORAL_TABLET | Freq: Every day | ORAL | Status: DC

## 2014-01-24 NOTE — Progress Notes (Signed)
Subjective:    Patient ID: Alejandra Ray, female    DOB: November 11, 1960, 53 y.o.   MRN: [id number]  HPI  This 53 y.o. Cauc female is here for CPE and labs. Pt has Type II DM, hypothyroidism and lipid disorder. Sleep disturbance continues despite some reduction of restless leg symptoms. Pt's husband has told her she snores; pt has daytime fatigue.  HCM: MMG- Pt to schedule.           PAP- Oct 2013.           CRS- 2010 (recall in 10 years).           IMM- Current; Flu vaccine due.              Patient Active Problem List   Diagnosis Date Noted  . Unspecified hypothyroidism 01/24/2014  . Other screening mammogram 08/23/2013  . Obesity, Class II, BMI 35.0-39.9, with comorbidity (see actual BMI) 11/24/2011  . Perimenopausal 08/03/2011  . SUI (stress urinary incontinence, female) 08/03/2011  . Allergic rhinitis 08/03/2011  . 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 RLS (restless legs syndrome) 08/03/2011  . Hyperlipidemia   . Type 2 diabetes mellitus     Prior to Admission medications   Medication Sig Start Date End Date Taking? Authorizing Provider  aspirin 81 MG tablet Take 81 mg by mouth daily.   Yes Historical Provider, MD  b complex-C-folic acid 1 MG capsule Take 1 capsule by mouth daily.   Yes Historical Provider, MD  cyclobenzaprine (FLEXERIL) 10 MG tablet TAKE 1 TABLET BY MOUTH AT BEDTIME AS NEEDED   Yes Maurice March, MD  fish oil-omega-3 fatty acids 1000 MG capsule Take 2 g by mouth daily.   Yes Historical Provider, MD  ipratropium (ATROVENT) 0.03 % nasal spray Place 2 sprays into both nostrils 2 (two) times daily. 06/29/13  Yes Chelle S Jeffery, PA-C  levothyroxine (SYNTHROID, LEVOTHROID) 50 MCG tablet TAKE 1 TABLET (50 MCG TOTAL) BY MOUTH DAILY. 09/04/13  Yes Maurice March, MD  metFORMIN (GLUCOPHAGE) 500 MG tablet TAKE 1 TABLET DAILY WITH YOUR LARGEST MEAL.   Yes Maurice March, MD  milk thistle 175 MG tablet Take 175 mg by mouth daily.   Yes Historical Provider, MD    Multiple Vitamin (MULTIVITAMIN) capsule Take 1 capsule by mouth daily.   Yes Historical Provider, MD  oxybutynin (DITROPAN-XL) 10 MG 24 hr tablet Take 1 tablet (10 mg total) by mouth daily.   Yes Maurice March, MD  valACYclovir (VALTREX) 1000 MG tablet Take 1 tablet (1,000 mg total) by mouth 2 (two) times daily.   Yes Maurice March, MD    History   Social History  . Marital Status: Married    Spouse Name: N/A    Number of Children: N/A  . Years of Education: N/A   Occupational History  . Not on file.   Social History Main Topics  . Smoking status: Former Smoker    Quit date: 05/13/1996  . Smokeless tobacco: Not on file  . Alcohol Use: Yes     Comment: occassionally  . Drug Use: Yes    Special: Marijuana     Comment: occassionally  . Sexual Activity: Yes   Other Topics Concern  . Not on file   Social History Narrative   Married. Education: some college. Exercise: No    Family History  Problem Relation Age of Onset  . Diabetes Mother   . Hyperlipidemia Mother   . Cancer Father   .  Diabetes Sister   . Hyperlipidemia Sister      Review of Systems  Constitutional: Negative.   HENT: Negative.   Eyes: Negative.   Respiratory: Negative.   Cardiovascular: Negative.   Gastrointestinal: Negative.   Endocrine: Negative.   Genitourinary: Negative.   Musculoskeletal: Negative.   Skin: Negative.   Allergic/Immunologic: Negative.   Neurological: Negative.   Hematological: Negative.   Psychiatric/Behavioral: Negative.       Objective:   Physical Exam  Nursing note and vitals reviewed. Constitutional: She is oriented to person, place, and time. Vital signs are normal. She appears well-developed and well-nourished. No distress.  HENT:  Head: Normocephalic and atraumatic.  Right Ear: Hearing, tympanic membrane, external ear and ear canal normal.  Left Ear: Hearing, tympanic membrane, external ear and ear canal normal.  Nose: Nose normal. No nasal  deformity or septal deviation.  Mouth/Throat: Uvula is midline, oropharynx is clear and moist and mucous membranes are normal. No oral lesions. Normal dentition. No dental caries. No posterior oropharyngeal erythema.  Eyes: Conjunctivae, EOM and lids are normal. Pupils are equal, round, and reactive to light. No scleral icterus.  Fundoscopic exam:      The right eye shows no papilledema. The right eye shows red reflex.       The left eye shows no papilledema. The left eye shows red reflex.  Neck: Trachea normal, normal range of motion, full passive range of motion without pain and phonation normal. Neck supple. No JVD present. No spinous process tenderness and no muscular tenderness present. Carotid bruit is not present. No mass and no thyromegaly present.  Cardiovascular: Normal rate, regular rhythm, S1 normal, S2 normal, normal heart sounds, intact distal pulses and normal pulses.   No extrasystoles are present. PMI is not displaced.  Exam reveals no gallop and no friction rub.   No murmur heard. Pulmonary/Chest: Effort normal and breath sounds normal. No respiratory distress. She has no decreased breath sounds. She has no wheezes. Right breast exhibits no inverted nipple, no mass, no nipple discharge, no skin change and no tenderness. Left breast exhibits no inverted nipple, no mass, no nipple discharge, no skin change and no tenderness. Breasts are symmetrical.  Abdominal: Soft. Normal appearance and bowel sounds are normal. She exhibits no distension, no abdominal bruit and no mass. There is no hepatosplenomegaly. There is no tenderness. There is no guarding and no CVA tenderness.  Genitourinary:  Deferred.  Musculoskeletal:       Cervical back: Normal.       Thoracic back: Normal.       Lumbar back: Normal.  Major joints unremarkable; very early degenerative changes w/ good ROM. No deformities, muscle atrophy, c/c/e.  Lymphadenopathy:       Head (right side): No submental, no submandibular,  no tonsillar, no preauricular, no posterior auricular and no occipital adenopathy present.       Head (left side): No submental, no submandibular, no tonsillar, no preauricular, no posterior auricular and no occipital adenopathy present.    She has no cervical adenopathy.    She has no axillary adenopathy.       Right: No supraclavicular adenopathy present.       Left: No supraclavicular adenopathy present.  Neurological: She is alert and oriented to person, place, and time. She has normal strength and normal reflexes. She displays no atrophy. No cranial nerve deficit or sensory deficit. She exhibits normal muscle tone. She displays a negative Romberg sign. Coordination and gait normal.  Skin: Skin  is warm, dry and intact. Lesion noted. No ecchymosis and no rash noted. She is not diaphoretic. No cyanosis or erythema. No pallor. Nails show no clubbing.  Skin tags around neck and in axillae.  Psychiatric: She has a normal mood and affect. Her speech is normal and behavior is normal. Judgment and thought content normal. Cognition and memory are normal.    A1c= 6.0%      Assessment & Plan:  Routine general medical examination at a health care facility  Type II or unspecified type diabetes mellitus without mention of complication, not stated as uncontrolled - Stable and well controlled on current medication. Continue healthy lifestyle and increase physical activity. Plan: HM Diabetes Foot Exam, POCT glycosylated hemoglobin (Hb A1C), COMPLETE METABOLIC PANEL WITH GFR  Unspecified hypothyroidism - No medication change.  Plan: Thyroid Panel With TSH  Hyperlipidemia - Plan: Lipid panel  RLS (restless legs syndrome) - Discussed Sleep Study w/ pt.  Plan: Ambulatory referral to Neurology  Cutaneous skin tags - Plan: Ambulatory referral to Dermatology  Snoring - Plan: Ambulatory referral to Neurology  Screening for colon cancer - Plan: IFOBT POC (occult bld, rslt in office)   Meds ordered this  encounter  Medications  . cyclobenzaprine (FLEXERIL) 10 MG tablet    Sig: TAKE 1 TABLET BY MOUTH AT BEDTIME AS NEEDED    Dispense:  30 tablet    Refill:  3  . metFORMIN (GLUCOPHAGE) 500 MG tablet    Sig: TAKE 1 TABLET DAILY WITH YOUR LARGEST MEAL.    Dispense:  30 tablet    Refill:  5  . valACYclovir (VALTREX) 1000 MG tablet    Sig: Take 1 tablet (1,000 mg total) by mouth 2 (two) times daily.    Dispense:  60 tablet    Refill:  3  . oxybutynin (DITROPAN-XL) 10 MG 24 hr tablet    Sig: Take 1 tablet (10 mg total) by mouth daily.    Dispense:  30 tablet    Refill:  5

## 2014-01-24 NOTE — Patient Instructions (Addendum)
Keeping You Healthy  Get These Tests  Blood Pressure- Have your blood pressure checked by your healthcare provider at least once a year.  Normal blood pressure is 120/80.  Weight- Have your body mass index (BMI) calculated to screen for obesity.  BMI is a measure of body fat based on height and weight.  You can calculate your own BMI at [email or web address]  Cholesterol- Have your cholesterol checked every year.  Diabetes- Have your blood sugar checked every year if you have high blood pressure, high cholesterol, a family history of diabetes or if you are overweight.  Pap Smear- Have a pap smear every 1 to 3 years if you have been sexually active.  If you are older than 65 and recent pap smears have been normal you may not need additional pap smears.  In addition, if you have had a hysterectomy  For benign disease additional pap smears are not necessary.  Mammogram-Yearly mammograms are essential for early detection of breast cancer  Screening for Colon Cancer- Colonoscopy starting at age 50. Screening may begin sooner depending on your family history and other health conditions.  Follow up colonoscopy as directed by your Gastroenterologist.  Screening for Osteoporosis- Screening begins at age 65 with bone density scanning, sooner if you are at higher risk for developing Osteoporosis.  Get these medicines  Calcium with Vitamin D- Your body requires 1200-1500 mg of Calcium a day and 800-1000 IU of Vitamin D a day.  You can only absorb 500 mg of Calcium at a time therefore Calcium must be taken in 2 or 3 separate doses throughout the day.  Hormones- Hormone therapy has been associated with increased risk for certain cancers and heart disease.  Talk to your healthcare provider about if you need relief from menopausal symptoms.  Aspirin- Ask your healthcare provider about taking Aspirin to prevent Heart Disease and Stroke.  Get these Immuniztions  Flu shot- Every fall  Pneumonia  shot- Once after the age of 65; if you are younger ask your healthcare provider if you need a pneumonia shot.  Tetanus- Every ten years.  Zostavax- Once after the age of 60 to prevent shingles.  Take these steps  Don't smoke- Your healthcare provider can help you quit. For tips on how to quit, ask your healthcare provider or go to www.smokefree.gov or call 1-800 QUIT-NOW.  Be physically active- Exercise 5 days a week for a minimum of 30 minutes.  If you are not already physically active, start slow and gradually work up to 30 minutes of moderate physical activity.  Try walking, dancing, bike riding, swimming, etc.  Eat a healthy diet- Eat a variety of healthy foods such as fruits, vegetables, whole grains, low fat milk, low fat cheeses, yogurt, lean meats, chicken, fish, eggs, dried beans, tofu, etc.  For more information go to www.thenutritionsource.org  Dental visit- Brush and floss teeth twice daily; visit your dentist twice a year.  Eye exam- Visit your Optometrist or Ophthalmologist yearly.  Drink alcohol in moderation- Limit alcohol intake to one drink or less a day.  Never drink and drive.  Depression- Your emotional health is as important as your physical health.  If you're feeling down or losing interest in things you normally enjoy, please talk to your healthcare provider.  Seat Belts- can save your life; always wear one  Smoke/Carbon Monoxide detectors- These detectors need to be installed on the appropriate level of your home.  Replace batteries at least once a year.  Violence- If anyone   is threatening or hurting you, please tell your healthcare provider.  Living Will/ Health care power of attorney- Discuss with your healthcare provider and family.    Restless Legs Syndrome Restless legs syndrome is a movement disorder. It may also be called a sensorimotor disorder.  CAUSES  No one knows what specifically causes restless legs syndrome, but it tends to run in families.  It is also more common in people with low iron, in pregnancy, in people who need dialysis, and those with nerve damage (neuropathy).Some medications may make restless legs syndrome worse.Those medications include drugs to treat high blood pressure, some heart conditions, nausea, colds, allergies, and depression. SYMPTOMS Symptoms include uncomfortable sensations in the legs. These leg sensations are worse during periods of inactivity or rest. They are also worse while sitting or lying down. Individuals that have the disorder describe sensations in the legs that feel like:  Pulling.  Drawing.  Crawling.  Worming.  Boring.  Tingling.  Pins and needles.  Prickling.  Pain. The sensations are usually accompanied by an overwhelming urge to move the legs. Sudden muscle jerks may also occur. Movement provides temporary relief from the discomfort. In rare cases, the arms may also be affected. Symptoms may interfere with going to sleep (sleep onset insomnia). Restless legs syndrome may also be related to periodic limb movement disorder (PLMD). PLMD is another more common motor disorder. It also causes interrupted sleep. The symptoms from PLMD usually occur most often when you are awake. TREATMENT  Treatment for restless legs syndrome is symptomatic. This means that the symptoms are treated.   Massage and cold compresses may provide temporary relief.  Walk, stretch, or take a cold or hot bath.  Get regular exercise and a good night's sleep.  Avoid caffeine, alcohol, nicotine, and medications that can make it worse.  Do activities that provide mental stimulation like discussions, needlework, and video games. These may be helpful if you are not able to walk or stretch. Some medications are effective in relieving the symptoms. However, many of these medications have side effects. Ask your caregiver about medications that may help your symptoms. Correcting iron deficiency may improve symptoms  for some patients. Document Released: 04/17/2002 Document Revised: 09/11/2013 Document Reviewed: 07/24/2010 [facility] Patient Information 2015 [place], [place]. This information is not intended to replace advice given to you by your health care provider. Make sure you discuss any questions you have with your health care provider.   You may want to get an over-the-counter supplement that has Magnesium + Calcium + Zinc and take it daily per package directions. Also there is a topical agent that may help reduce leg discomfort- it is called Phys-Assist Foot Pain Cream.        Mediterranean Diet  Why follow it? Research shows.   Those who follow the Mediterranean diet have a reduced risk of heart disease    The diet is associated with a reduced incidence of Parkinson's and Alzheimer's diseases   People following the diet may have longer life expectancies and lower rates of chronic diseases    The Dietary Guidelines for Americans recommends the Mediterranean diet as an eating plan to promote health and prevent disease  What Is the Mediterranean Diet?    Healthy eating plan based on typical foods and recipes of Mediterranean-style cooking   The diet is primarily a plant based diet; these foods should make up a majority of meals   Starches - Plant based foods should make up a majority of meals -  They are an important sources of vitamins, minerals, energy, antioxidants, and fiber - Choose whole grains, foods high in fiber and minimally processed items  - Typical grain sources include wheat, oats, barley, corn, brown rice, bulgar, farro, millet, polenta, couscous  - Various types of beans include chickpeas, lentils, fava beans, black beans, white beans   Fruits  Veggies - Large quantities of antioxidant rich fruits & veggies; 6 or more servings  - Vegetables can be eaten raw or lightly drizzled with oil and cooked  - Vegetables common to the traditional Mediterranean Diet include: artichokes,  arugula, beets, broccoli, brussel sprouts, cabbage, carrots, celery, collard greens, cucumbers, eggplant, kale, leeks, lemons, lettuce, mushrooms, okra, onions, peas, peppers, potatoes, pumpkin, radishes, rutabaga, shallots, spinach, sweet potatoes, turnips, zucchini - Fruits common to the Mediterranean Diet include: apples, apricots, avocados, cherries, clementines, dates, figs, grapefruits, grapes, melons, nectarines, oranges, peaches, pears, pomegranates, strawberries, tangerines  Fats - Replace butter and margarine with healthy oils, such as olive oil, canola oil, and tahini  - Limit nuts to no more than a handful a day  - Nuts include walnuts, almonds, pecans, pistachios, pine nuts  - Limit or avoid candied, honey roasted or heavily salted nuts - Olives are central to the [employer] - can be eaten whole or used in a variety of dishes   Meats Protein - Limiting red meat: no more than a few times a month - When eating red meat: choose lean cuts and keep the portion to the size of deck of cards - Eggs: approx. 0 to 4 times a week  - Fish and lean poultry: at least 2 a week  - Healthy protein sources include, chicken, [place], lean beef, lamb - Increase intake of seafood such as tuna, salmon, trout, mackerel, shrimp, scallops - Avoid or limit high fat processed meats such as sausage and bacon  Dairy - Include moderate amounts of low fat dairy products  - Focus on healthy dairy such as fat free yogurt, skim milk, low or reduced fat cheese - Limit dairy products higher in fat such as whole or 2% milk, cheese, ice cream  Alcohol - Moderate amounts of red wine is ok  - No more than 5 oz daily for women (all ages) and men older than age 15  - No more than 10 oz of wine daily for men younger than 75  Other - Limit sweets and other desserts  - Use herbs and spices instead of salt to flavor foods  - Herbs and spices common to the traditional Mediterranean Diet include: basil, bay leaves,  chives, cloves, cumin, fennel, garlic, lavender, marjoram, mint, oregano, parsley, pepper, rosemary, sage, savory, sumac, tarragon, thyme   It's not just a diet, it's a lifestyle:    The Mediterranean diet includes lifestyle factors typical of those in the region    Foods, drinks and meals are best eaten with others and savored   Daily physical activity is important for overall good health   This could be strenuous exercise like running and aerobics   This could also be more leisurely activities such as walking, housework, yard-work, or taking the stairs   Moderation is the key; a balanced and healthy diet accommodates most foods and drinks   Consider portion sizes and frequency of consumption of certain foods   Meal Ideas & Options:    Breakfast:  o Whole wheat toast or whole wheat English muffins with peanut butter & hard boiled egg o Steel cut oats topped  with apples & cinnamon and skim milk  o Fresh fruit: banana, strawberries, melon, berries, peaches  o Smoothies: strawberries, bananas, greek yogurt, peanut butter o Low fat greek yogurt with blueberries and granola  o Egg white omelet with spinach and mushrooms o Breakfast couscous: whole wheat couscous, apricots, skim milk, cranberries    Sandwiches:  o Hummus and grilled vegetables (peppers, zucchini, squash) on whole wheat bread   o Grilled chicken on whole wheat pita with lettuce, tomatoes, cucumbers or tzatziki  o Tuna salad on whole wheat bread: tuna salad made with greek yogurt, olives, red peppers, capers, green onions o Garlic rosemary lamb pita: lamb sauted with garlic, rosemary, salt & pepper; add lettuce, cucumber, greek yogurt to pita - flavor with lemon juice and black pepper    Seafood:  o Mediterranean grilled salmon, seasoned with garlic, basil, parsley, lemon juice and black pepper o Shrimp, lemon, and spinach whole-grain pasta salad made with low fat greek yogurt  o Seared scallops with lemon orzo  o Seared tuna  steaks seasoned salt, pepper, coriander topped with tomato mixture of olives, tomatoes, olive oil, minced garlic, parsley, green onions and cappers    Meats:  o Herbed greek chicken salad with kalamata olives, cucumber, feta  o Red bell peppers stuffed with spinach, bulgur, lean ground beef (or lentils) & topped with feta   o Kebabs: skewers of chicken, tomatoes, onions, zucchini, squash  o [place] burgers: made with red onions, mint, dill, lemon juice, feta cheese topped with roasted red peppers   Vegetarian o Cucumber salad: cucumbers, artichoke hearts, celery, red onion, feta cheese, tossed in olive oil & lemon juice  o Hummus and whole grain pita points with a greek salad (lettuce, tomato, feta, olives, cucumbers, red onion) o Lentil soup with celery, carrots made with vegetable broth, garlic, salt and pepper  o Tabouli salad: parsley, bulgur, mint, scallions, cucumbers, tomato, radishes, lemon juice, olive oil, salt and pepper.

## 2014-01-26 NOTE — Progress Notes (Signed)
Quick Note:  Please advise pt regarding following labs...  Thyroid function studies are normal; you are taking the correct dose of thyroid medication.  Metabolic panel looks good; blood sugar is a little above normal and one of the liver function test (ALT) is just a little above normal. This number is lower than it has been in the past and is probably a reflection of fatty liver disease. Lipid panel shows no change from 1 year ago. Improving nutrition, increasing physical activity and weight loss will improve these numbers.  Continue all current medications.  Copy to pt. ______

## 2014-02-02 ENCOUNTER — Ambulatory Visit (INDEPENDENT_AMBULATORY_CARE_PROVIDER_SITE_OTHER): Payer: BC Managed Care – PPO | Admitting: Neurology

## 2014-02-02 ENCOUNTER — Encounter: Payer: Self-pay | Admitting: Neurology

## 2014-02-02 VITALS — BP 120/78 | HR 63 | Resp 16 | Ht 65.75 in | Wt 221.0 lb

## 2014-02-02 DIAGNOSIS — R0981 Nasal congestion: Secondary | ICD-10-CM

## 2014-02-02 DIAGNOSIS — F4541 Pain disorder exclusively related to psychological factors: Secondary | ICD-10-CM

## 2014-02-02 DIAGNOSIS — R0989 Other specified symptoms and signs involving the circulatory and respiratory systems: Secondary | ICD-10-CM

## 2014-02-02 DIAGNOSIS — J3489 Other specified disorders of nose and nasal sinuses: Secondary | ICD-10-CM

## 2014-02-02 DIAGNOSIS — R0609 Other forms of dyspnea: Secondary | ICD-10-CM

## 2014-02-02 DIAGNOSIS — G44209 Tension-type headache, unspecified, not intractable: Secondary | ICD-10-CM

## 2014-02-02 DIAGNOSIS — R0683 Snoring: Secondary | ICD-10-CM

## 2014-02-02 DIAGNOSIS — E669 Obesity, unspecified: Secondary | ICD-10-CM

## 2014-02-02 NOTE — Patient Instructions (Signed)
Polysomnography (Sleep Studies) Polysomnography (PSG) is a series of tests used for detecting (diagnosing) obstructive sleep apnea and other sleep disorders. The tests measure how some parts of your body are working while you are sleeping. The tests are extensive and expensive. They are done in a sleep lab or hospital, and vary from center to center. Your caregiver may perform other more simple sleep studies and questionnaires before doing more complete and involved testing. Testing may not be covered by insurance. Some of these tests are:  An EEG (Electroencephalogram). This tests your brain waves and stages of sleep.  An EOG (Electrooculogram). This measures the movements of your eyes. It detects periods of REM (rapid eye movement) sleep, which is your dream sleep.  An EKG (Electrocardiogram). This measures your heart rhythm.  EMG (Electromyography). This is a measurement of how the muscles are working in your upper airway and your legs while sleeping.  An oximetry measurement. It measures how much oxygen (air) you are getting while sleeping.  Breathing efforts may be measured. The same test can be interpreted (understood) differently by different caregivers and centers that study sleep.  Studies may be given an apnea/hypopnea index (AHI). This is a number which is found by counting the times of no breathing or under breathing during the night, and relating those numbers to the amount of time spent in bed. When the AHI is greater than 15, the patient is likely to complain of daytime sleepiness. When the AHI is greater than 30, the patient is at increased risk for heart problems and must be followed more closely. Following the AHI also allows you to know how treatment is working. Simple oximetry (tracking the amount of oxygen that is taken in) can be used for screening patients who:  Do not have symptoms (problems) of OSA.  Have a normal Epworth Sleepiness Scale Score.  Have a low pre-test  probability of having OSA.  Have none of the upper airway problems likely to cause apnea.  Oximetry is also used to determine if treatment is effective in patients who showed significant desaturations (not getting enough oxygen) on their home sleep study. One extra measure of safety is to perform additional studies for the person who only snores. This is because no one can predict with absolute certainty who will have OSA. Those who show significant desaturations (not getting enough oxygen) are recommended to have a more detailed sleep study. Document Released: 11/01/2002 Document Revised: 07/20/2011 Document Reviewed: 07/03/2013 [employer] Patient Information 2015 [employer]. This information is not intended to replace advice given to you by your health care provider. Make sure you discuss any questions you have with your health care provider.  

## 2014-02-02 NOTE — Progress Notes (Signed)
SLEEP MEDICINE CLINIC   Provider:  Melvyn Novas, M D  Referring Provider: Maurice March, MD Primary Care Physician:  [name], MD    HPI:  Alejandra Ray is a 53 y.o. female , who is seen here as a referral revisit  from Dr. Audria Nine for a sleep evaluation,  The scalpel had mentioned to her primary care physician that she felt more fatigued and less daytime alert in addition her husband has reported that she snores. [name] did order a whole panel of metabolic tests are indicated for fatigue and excessive daytime sleepiness including and thyroid panel. Her thyroid function seems to be in normal limits a year ago she had an elevated TSH however 5 days ago it was 3.7 from previously 5.8. The hemoglobin A1c was 6.0 the specimen was collected on 01-24-14.  A nonfast  blood level of glucose was 119, the ALT was 51 her AST fifth 28 normal albumin normal creatinine normal potassium and normal sodium levels reported.  She has an elevated triglyceride lot triglyceride level which seems to be dietary.  LDL cholesterol was 108 and a slightly elevated. Her vitamin D level was not yet drawn. She went into menopause October 2014.  The patient was placed on metformin after HbA1c had reached 6.2. She has a strong family history of diabetes affecting siblings uncle and mother.  The patient  Goes to bed about 10 PM at night, and falls asleep around 11 PM.  She watches TV in bed waiting to unwind.  She cuts the TV off before sleeping. She has to arise at 5 AM. She shares the bedroom with her husband, no pats, no small children.  She wakes up 1-2 time for bathroom breaks, goes back to sleep promptly. She relies on multiple alarms, hits the snooze button twice. She d feels refreshed and restored in AM> drinks neither coffee, nor soda nor tea.  She works from 6.30 AM to 3 Pm and goes to take care of her mother in a nursing facility from 5-7 , then prepares dinner. When not phyiscially  active , she will fall asleep , she does not plan for naps, but reports that she can inadvertently dozes off easily when she is physically not active or not stimulated. She does 'nt nap on week ends , she is with her grandchildren.  She has one biological son, whom she raised  and 2 step daughter and six grandchildren.   Review of Systems: Out of a complete 14 system review, the patient complains of only the following symptoms, and all other reviewed systems are negative.   Epworth score 16, Fatigue severity score 43  , depression score 2   History   Social History  . Marital Status: Married    Spouse Name: Alejandra Ray    Number of Children: 1  . 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 Smoking status: Former Smoker    Quit date: 05/13/1996  . Smokeless tobacco: Never Used  . Alcohol Use: Yes     Comment: occassionally  . Drug Use: Yes    Special: Marijuana     Comment: occassionally  . Sexual Activity: Yes   Other Topics Concern  . Not on file   Social History Narrative   Patient is married Alejandra Ray) and lives at home with her husband.   Patient has one adult child.   Patient is right-handed.   Patient does not  drink any caffeine.   Married. Education: some college. Exercise: No    Family History  Problem Relation Age of Onset  . Diabetes Mother   . Hyperlipidemia Mother   . Cancer Father   . Diabetes Sister   . Hyperlipidemia Sister     Past Medical History  Diagnosis Date  . Diabetes mellitus   . Hyperlipidemia   . Type 2 diabetes mellitus   . Allergic rhinitis   . SUI (stress urinary incontinence), female     Past Surgical History  Procedure Laterality Date  . Cholecystectomy    . Tublation  2001    Current Outpatient Prescriptions  Medication Sig Dispense Refill  . aspirin 81 MG tablet Take 81 mg by mouth daily.      Marland Kitchen b complex-C-folic acid 1 MG capsule Take 1 capsule by mouth daily.       . cyclobenzaprine (FLEXERIL) 10 MG tablet TAKE 1 TABLET BY MOUTH AT BEDTIME AS NEEDED  30 tablet  3  . fish oil-omega-3 fatty acids 1000 MG capsule Take 2 g by mouth daily.      Marland Kitchen ipratropium (ATROVENT) 0.03 % nasal spray Place 2 sprays into both nostrils 2 (two) times daily.  30 mL  0  . levothyroxine (SYNTHROID, LEVOTHROID) 50 MCG tablet TAKE 1 TABLET (50 MCG TOTAL) BY MOUTH DAILY.  30 tablet  5  . metFORMIN (GLUCOPHAGE) 500 MG tablet TAKE 1 TABLET DAILY WITH YOUR LARGEST MEAL.  30 tablet  5  . milk thistle 175 MG tablet Take 175 mg by mouth daily.      . Multiple Vitamin (MULTIVITAMIN) capsule Take 1 capsule by mouth daily.      Marland Kitchen oxybutynin (DITROPAN-XL) 10 MG 24 hr tablet Take 1 tablet (10 mg total) by mouth daily.  30 tablet  5  . valACYclovir (VALTREX) 1000 MG tablet Take 1 tablet (1,000 mg total) by mouth 2 (two) times daily.  60 tablet  3   No current facility-administered medications for this visit.    Allergies as of 02/02/2014 - Review Complete 02/02/2014  Allergen Reaction Noted  . Codeine  08/03/2011  . Sulfa antibiotics Nausea And Vomiting 08/03/2011    Vitals: BP 120/78  Pulse 63  Resp 16  Ht 5' 5.75" (1.67 m)  Wt 221 lb (100.245 kg)  BMI 35.94 kg/m2  LMP 08/31/2012 Last Weight:  Wt Readings from Last 1 Encounters:  02/02/14 221 lb (100.245 kg)       Last Height:   Ht Readings from Last 1 Encounters:  02/02/14 5' 5.75" (1.67 m)    Physical exam:  General: The patient is awake, alert and appears not in acute distress. The patient is well groomed. Head: Normocephalic, atraumatic. Neck is supple. Mallampati 3,  neck circumference:17. Nasal airflow unrestricted, TMJ is not evident . Retrognathia is not seen.  Cardiovascular:  Regular rate and rhythm , without  murmurs or carotid bruit, and without distended neck veins. Respiratory: Lungs are clear to auscultation. Skin:  Without evidence of edema, or rash Trunk: BMI is elevated and patient  has normal  posture.  Neurologic exam : The patient is awake and alert, oriented to place and time.   Memory subjective  described as intact. There is a normal attention span & concentration ability.  Speech is fluent without dysarthria, dysphonia or aphasia.  Mood and affect are appropriate.  Cranial nerves: Pupils are equal and briskly reactive to light. Funduscopic exam without  evidence of pallor or edema.  Extraocular  movements  in vertical and horizontal planes intact and without nystagmus. Visual fields by finger perimetry are intact. Hearing to finger rub intact.  Facial sensation intact to fine touch. Facial motor strength is symmetric and tongue and uvula move midline. Motor exam: Normal tone, muscle bulk and symmetric strength in all extremities. Sensory:  Fine touch, pinprick and vibration were tested in all extremities.  Proprioception is tested in the upper extremities only. This was normal. Coordination: Rapid alternating movements in the fingers/hands is normal.  Finger-to-nose maneuver normal without evidence of ataxia, dysmetria or tremor. Gait and station: Patient walks without assistive device and is able unassisted to climb up to the exam table.  Strength within normal limits. Stance is stable and normal. Tandem gait is unfragmented. Romberg testing is negative. Deep tendon reflexes: in the  upper and lower extremities are symmetric and intact. Babinski maneuver response is downgoing.   Assessment:  After physical and neurologic examination, review of laboratory studies, imaging, neurophysiology testing and pre-existing records, assessment is  1)  Patient with large neck, mallompatti of 3, snoring and witnessed apnea. She has not been aware of apnea.    2) hypersomnia- Fatigue  43, Epworth 16 - not safe to drive when fatigued.   3) BMI, weight gain over perimenopausal period.   4) Inhaler for "sinus allergies "  The patient was advised of the nature of the diagnosed sleep  disorder , the treatment options and risks for general a health and wellness arising from not treating the condition. Visit duration was 30 minutes.   Plan:  Treatment plan and additional workup :  SPLIT at 4%,  AHI 20,  Needs supine sleep for at least 30 minutes ,   If available, CO2.        Porfirio Mylar Shilee Biggs MD  02/02/2014

## 2014-02-07 ENCOUNTER — Encounter: Payer: Self-pay | Admitting: Radiology

## 2014-02-13 LAB — IFOBT (OCCULT BLOOD): IFOBT: NEGATIVE

## 2014-02-13 NOTE — Progress Notes (Signed)
Quick Note:  Please advise pt regarding following labs...  Advise pt that stool test she returned was negative for blood. ______

## 2014-03-08 ENCOUNTER — Other Ambulatory Visit: Payer: Self-pay | Admitting: Family Medicine

## 2014-03-09 ENCOUNTER — Ambulatory Visit (INDEPENDENT_AMBULATORY_CARE_PROVIDER_SITE_OTHER): Payer: BC Managed Care – PPO | Admitting: Neurology

## 2014-03-09 DIAGNOSIS — R0981 Nasal congestion: Secondary | ICD-10-CM

## 2014-03-09 DIAGNOSIS — E669 Obesity, unspecified: Secondary | ICD-10-CM

## 2014-03-09 DIAGNOSIS — F4541 Pain disorder exclusively related to psychological factors: Secondary | ICD-10-CM

## 2014-03-09 DIAGNOSIS — R0683 Snoring: Secondary | ICD-10-CM

## 2014-03-09 NOTE — Sleep Study (Signed)
Please see the scanned sleep study interpretation located in the Procedure tab within the Chart Review section. 

## 2014-03-10 ENCOUNTER — Other Ambulatory Visit: Payer: Self-pay | Admitting: Family Medicine

## 2014-03-15 DIAGNOSIS — E669 Obesity, unspecified: Secondary | ICD-10-CM | POA: Insufficient documentation

## 2014-03-15 DIAGNOSIS — R0683 Snoring: Secondary | ICD-10-CM | POA: Insufficient documentation

## 2014-03-15 DIAGNOSIS — F4541 Pain disorder exclusively related to psychological factors: Secondary | ICD-10-CM | POA: Insufficient documentation

## 2014-03-21 ENCOUNTER — Other Ambulatory Visit: Payer: Self-pay | Admitting: Family Medicine

## 2014-03-23 ENCOUNTER — Other Ambulatory Visit: Payer: Self-pay | Admitting: Neurology

## 2014-03-23 ENCOUNTER — Telehealth: Payer: Self-pay | Admitting: *Deleted

## 2014-03-23 DIAGNOSIS — G4733 Obstructive sleep apnea (adult) (pediatric): Secondary | ICD-10-CM

## 2014-03-23 NOTE — Telephone Encounter (Signed)
Patient returned my phone calls and was provided the results of her overnight sleep study.  She was advised there was a positive diagnosis for sleep apnea and treatment in the form of CPAP had been advised.  Patient scheduled her CPAP study for Dec. 27th at 9:30 pm.  Patient was mailed a copy of the report and Dr. Dow AdolphBarbara McPherson was faxed a copy.

## 2014-03-30 ENCOUNTER — Other Ambulatory Visit: Payer: Self-pay | Admitting: Family Medicine

## 2014-04-04 ENCOUNTER — Telehealth: Payer: Self-pay | Admitting: Neurology

## 2014-04-04 ENCOUNTER — Encounter: Payer: Self-pay | Admitting: Neurology

## 2014-04-04 DIAGNOSIS — G478 Other sleep disorders: Secondary | ICD-10-CM

## 2014-04-04 DIAGNOSIS — G4733 Obstructive sleep apnea (adult) (pediatric): Secondary | ICD-10-CM

## 2014-04-04 NOTE — Telephone Encounter (Signed)
Due to demand to get an urgent patient seen before the end of the year, this patient's CPAP titration was canceled due to mild osa and good candidacy for auto titration.  Please place an order for auto-titration for submission to a dme company for processing.

## 2014-05-30 ENCOUNTER — Encounter: Payer: Self-pay | Admitting: Neurology

## 2014-06-18 ENCOUNTER — Encounter: Payer: Self-pay | Admitting: Neurology

## 2014-06-18 ENCOUNTER — Ambulatory Visit (INDEPENDENT_AMBULATORY_CARE_PROVIDER_SITE_OTHER): Payer: BLUE CROSS/BLUE SHIELD | Admitting: Neurology

## 2014-06-18 VITALS — BP 115/71 | HR 70 | Resp 16 | Wt 204.0 lb

## 2014-06-18 DIAGNOSIS — E669 Obesity, unspecified: Secondary | ICD-10-CM

## 2014-06-18 DIAGNOSIS — Z9989 Dependence on other enabling machines and devices: Secondary | ICD-10-CM

## 2014-06-18 DIAGNOSIS — G4733 Obstructive sleep apnea (adult) (pediatric): Secondary | ICD-10-CM | POA: Insufficient documentation

## 2014-06-18 DIAGNOSIS — G478 Other sleep disorders: Secondary | ICD-10-CM

## 2014-06-18 NOTE — Progress Notes (Signed)
SLEEP MED[facility]INE CLINIC   Provider:  Melvyn Novas, M D  Referring Provider: Maurice March, MD Primary Care Physician:  [name], MD    HPI:  Alejandra Ray is a 54 y.o. female ,  seen here as a  revisit  from Dr. Audria Nine for a sleep apnea, Compliance to CPAP.  Interval history the patient is here today after her sleep study which took place on 03-09-14 shortly after our initial consultation. She had at the time endorsed the Epworth score at 16 and the fatigue severity score at 43. The patient also endorsed the PHQ9 at 11 points. Her sleep study revealed an AHI of 14.9 which is considered mild apnea and an RDI of 16.6 also in the mild range. When she slept on her back her AHI which consists of apneas and hypotony as meaning cessation of breathing and shallow breathing spells per hour of sleep, increased to 35.6 there was a clear positional component to her apnea. She had frequent periodic limb movements but these did not lead to arousals at least not a significant degree.  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 And today I'm able to look at the download from her machine. The machine is set between a minimum pressure of 5 and maximum pressure of 12 cm water with an EPR of 2 cm.  She's using the machine nightly for 5 hours 11 minutes on average, her compliance is 97% for the last 30 days and 83% for over 4 hours of nightly use.  Her AHI is 2.3. The needs to be no adjustments made also one could argue to set the machine at 9 cm pressure which is close to her 95th percentile need.  The patient reports that she sleeps better more restful and sound when using CPAP - but she seems to have an allergy to some of the plastic parts , especially with the interface touches her nostrils. She has noticed this plastic allergy in the  past with other implement such as bijoux.   I am not clear about the allergy being against silica or latex. She no longer feels sleepy or fatigued, sleeps on average only  5-6 hours but sound!      Last visit October 2015 , sleep consult. The patient  had mentioned to her primary care physician that she felt more fatigued and less daytime alert in addition her husband has reported that she snores. [name] did order a whole panel of metabolic tests are indicated for fatigue and excessive daytime sleepiness including and thyroid panel. Her thyroid function seems to be in normal limits a year ago she had an elevated TSH however 5 days ago it was 3.7 from previously 5.8. The hemoglobin A1c was 6.0 the specimen was collected on 01-24-14.  A nonfast  blood level of glucose was 119, the ALT was 51 her AST fifth 28 normal albumin normal creatinine normal potassium and normal sodium levels reported. She has an elevated triglyceride lot triglyceride level which seems to be dietary.  LDL cholesterol was 108 and a slightly elevated. Her vitamin D level was not yet drawn.  She went into menopause October 2014.  The patient was placed on metformin after HbA1c had reached 6.2. She has a strong family history of diabetes affecting siblings uncle and mother.  The patient  Goes to bed about 10  PM at night, and falls asleep around 11 PM.  She watches TV in bed waiting to unwind.  She cuts the TV off before sleeping. She has to arise at 5 AM. She shares the bedroom with her husband, no pats, no small children.  She wakes up 1-2 time for bathroom breaks, goes back to sleep promptly. She relies on multiple alarms, hits the snooze button twice.  She feels refreshed and restored in AM, drinks neither coffee, nor soda nor tea.  She works from 6.30 AM to 3 Pm and goes to take care of her mother in a nursing facility from 5-7 , then prepares dinner. When not phyiscially active , she will fall asleep , she does not  plan for naps, but reports that she can inadvertently dozes off easily when she is physically not active or not stimulated. She does 'nt nap on week ends , she is with her grandchildren.  She has one biological son, whom she raised  and 2 step daughter and six grandchildren.   Review of Systems: Out of a complete 14 system review, the patient complains of only the following symptoms, and all other reviewed systems are negative. OSA and UARS.  06-18-14, no dry mouth, one nocturia, morning headaches.  Epworth score  5 from previously 16, Fatigue severity score  23 from 43  , depression score 2   History   Social History  . Marital Status: Married    Spouse Name: [name]    Number of Children: 1  . 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 Smoking status: Former Smoker    Quit date: 05/13/1996  . Smokeless tobacco: Never Used  . Alcohol Use: Yes     Comment: occassionally  . Drug Use: Yes    Special: Marijuana     Comment: occassionally  . Sexual Activity: Yes   Other Topics Concern  . Not on file   Social History Narrative   Patient is married [name](David) and lives at home with her husband.   Patient has one adult child.   Patient is right-handed.   Patient does not drink any caffeine.   Married. Education: some college. Exercise: No    Family History  Problem Relation Age of Onset  . Diabetes Mother   . Hyperlipidemia Mother   . Cancer Father   . Diabetes Sister   . Hyperlipidemia Sister     Past Medical History  Diagnosis Date  . Diabetes mellitus   . Hyperlipidemia   . Type 2 diabetes mellitus   . Allergic rhinitis   . SUI (stress urinary incontinence), female     Past Surgical History  Procedure Laterality Date  . Cholecystectomy    . Tublation  2001    Current Outpatient Prescriptions  Medication Sig Dispense Refill  . aspirin 81 MG tablet Take 81 mg by mouth daily.    . cyclobenzaprine  (FLEXERIL) 10 MG tablet TAKE 1 TABLET BY MOUTH AT BEDTIME AS NEEDED 30 tablet 3  . fish oil-omega-3 fatty acids 1000 MG capsule Take 2 g by mouth daily.    Marland Kitchen. ipratropium (ATROVENT) 0.03 % nasal spray Place 2 sprays into both nostrils 2 (two) times daily. (Patient taking differently: Place 2 sprays into both nostrils 2 (two) times daily as needed. ) 30 mL 0  . levothyroxine (SYNTHROID, LEVOTHROID) 50 MCG tablet TAKE 1 TABLET (50 MCG TOTAL) BY MOUTH DAILY. 30 tablet 5  .  milk thistle 175 MG tablet Take 175 mg by mouth daily.    . Misc Natural Products (RED CLOVER COMBINATION PO) Take 200 mg by mouth daily.    . Multiple Vitamin (MULTIVITAMIN) capsule Take 1 capsule by mouth daily.    . valACYclovir (VALTREX) 1000 MG tablet Take 1 tablet (1,000 mg total) by mouth 2 (two) times daily. 60 tablet 3  . vitamin B-12 (CYANOCOBALAMIN) 500 MCG tablet Take 500 mcg by mouth daily.    . metFORMIN (GLUCOPHAGE) 500 MG tablet TAKE 1 TABLET DAILY WITH YOUR LARGEST MEAL. (Patient not taking: Reported on 06/18/2014) 30 tablet 5  . oxybutynin (DITROPAN-XL) 10 MG 24 hr tablet Take 1 tablet (10 mg total) by mouth daily. (Patient not taking: Reported on 06/18/2014) 30 tablet 5   No current facility-administered medications for this visit.    Allergies as of 06/18/2014 - Review Complete 06/18/2014  Allergen Reaction Noted  . Codeine  08/03/2011  . Other  06/18/2014  . Sulfa antibiotics Nausea And Vomiting 08/03/2011    Vitals: BP 115/71 mmHg  Pulse 70  Resp 16  Wt 204 lb (92.534 kg)  LMP 08/31/2012 Last Weight:  Wt Readings from Last 1 Encounters:  06/18/14 204 lb (92.534 kg)       Last Height:   Ht Readings from Last 1 Encounters:  03/10/14  (1.676 m)    Physical exam:  General: The patient is awake, alert and appears not in acute distress. The patient is well groomed. Head: Normocephalic, atraumatic. Neck is supple. Mallampati 3,  neck circumference:17. Nasal airflow unrestricted, TMJ is not evident  .  Retrognathia is not seen.  Cardiovascular:  Regular rate and rhythm , without  murmurs or carotid bruit, and without distended neck veins. Respiratory: Lungs are clear to auscultation. Skin:  Without evidence of edema, or rash, thr is a facial rash on the upper lip and in the nostrils.  Trunk: BMI is elevated and patient  has normal posture.  Neurologic exam : The patient is awake and alert, oriented to place and time.   Memory subjective  described as intact. There is a normal attention span & concentration ability.  Speech is fluent without dysarthria, dysphonia or aphasia.  Mood and affect are appropriate.  Cranial nerves: Pupils are equal and briskly reactive to light. Hearing to finger rub intact.  Facial sensation intact to fine touch. Facial motor strength is symmetric and tongue and uvula move midline. Motor exam: Normal tone, muscle bulk and symmetric strength in all extremities. Deep tendon reflexes: in the  upper and lower extremities are symmetric and intact. Babinski maneuver response is downgoing.   Assessment:  After physical and neurologic examination, review of laboratory studies, imaging, neurophysiology testing and pre-existing records, assessment is  1)  Patient with large neck, mallompatti of 3, snoring and witnessed apnea. She has obstructive sleep apnea. UARS.  2) hypersomnia- Fatigue  Resolved . 3) BMI, weight gain over perimenopausal period. Unchanged to initial visit.  4) Inhaler for "sinus allergies ", will add sinus protection gel . " CPAP nasal  gel" .   The patient was advised of the nature of the diagnosed sleep disorder of UARS and OSA with positional component.  , the treatment options and risks for general a health and wellness arising from not treating the condition. Visit duration was 30 minutes. Tennisball methode. Explained to avoid supine sleep.  Weight loss will help with OSA , with snoring . Suggested to dry dream ware mask - andy  foster.    Plan:  Treatment plan and additional workup :  SPLIT at 4%,  AHI 20,  Needs supine sleep for at least 30 minutes ,   If available, CO2.        Porfirio Mylar Kymberli Wiegand MD  06/18/2014

## 2014-07-25 ENCOUNTER — Encounter: Payer: Self-pay | Admitting: Family Medicine

## 2014-07-25 ENCOUNTER — Ambulatory Visit (INDEPENDENT_AMBULATORY_CARE_PROVIDER_SITE_OTHER): Payer: BLUE CROSS/BLUE SHIELD | Admitting: Family Medicine

## 2014-07-25 VITALS — BP 120/76 | HR 58 | Temp 98.4°F | Resp 16 | Ht 66.0 in | Wt 206.2 lb

## 2014-07-25 DIAGNOSIS — E034 Atrophy of thyroid (acquired): Secondary | ICD-10-CM

## 2014-07-25 DIAGNOSIS — J0101 Acute recurrent maxillary sinusitis: Secondary | ICD-10-CM | POA: Diagnosis not present

## 2014-07-25 DIAGNOSIS — E038 Other specified hypothyroidism: Secondary | ICD-10-CM | POA: Diagnosis not present

## 2014-07-25 DIAGNOSIS — E119 Type 2 diabetes mellitus without complications: Secondary | ICD-10-CM | POA: Diagnosis not present

## 2014-07-25 LAB — BASIC METABOLIC PANEL
BUN: 16 mg/dL (ref 6–23)
CO2: 26 mEq/L (ref 19–32)
Calcium: 9.3 mg/dL (ref 8.4–10.5)
Chloride: 103 mEq/L (ref 96–112)
Creat: 0.63 mg/dL (ref 0.50–1.10)
Glucose, Bld: 106 mg/dL — ABNORMAL HIGH (ref 70–99)
Potassium: 4.6 mEq/L (ref 3.5–5.3)
Sodium: 138 mEq/L (ref 135–145)

## 2014-07-25 LAB — TSH: TSH: 2.671 u[IU]/mL (ref 0.350–4.500)

## 2014-07-25 LAB — POCT GLYCOSYLATED HEMOGLOBIN (HGB A1C): Hemoglobin A1C: 6.1

## 2014-07-25 MED ORDER — CEFUROXIME AXETIL 250 MG PO TABS: 250.0000 mg | ORAL_TABLET | Freq: Two times a day (BID) | ORAL | Status: DC

## 2014-07-25 NOTE — Progress Notes (Signed)
Subjective:    Patient ID: Alejandra Ray, female    DOB: [birth year], 54 y.o.   MRN: [id number]  HPI  This 54 y.o. Female returns for follow-up re: Type II DM, hypothyroidism and obesity. She has worked hard to change her diet and increase physical activity. Pt wants to discontinue Metformin, complaining that she has GI upset and BS seems to increase w/ each dose. FSBS 100-120 range w/o Metformin. Pt has lost > 10 lbs since Sept 2015; last A1c= 6.0%.  Pt c/o facial pressure and sinus congestion for 2+ weeks. Hx of recurrent sinus infection and seasonal allergies. Cough is slightly prod w/o CP or wheezing. Nasal drainage is yellow-green. Pt is using ipratropium nasal spray bid.   Patient Active Problem List   Diagnosis Date Noted  . OSA on CPAP 06/18/2014  . UARS (upper airway resistance syndrome) 06/18/2014  . Obesity (BMI 30.0-34.9) 06/18/2014  . Snoring 03/15/2014  . Obesity 03/15/2014  . Stress headaches 03/15/2014  . Unspecified hypothyroidism 01/24/2014  . Other screening mammogram 08/23/2013  . Obesity, Class II, BMI 35.0-39.9, with comorbidity (see actual BMI) 11/24/2011  . Perimenopausal 08/03/2011  . SUI (stress urinary incontinence, female) 08/03/2011  . Allergic rhinitis 08/03/2011  . 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 RLS (restless legs syndrome) 08/03/2011  . Hyperlipidemia   . Type 2 diabetes mellitus     Prior to Admission medications   Medication Sig Start Date End Date Taking? Authorizing Provider  aspirin 81 MG tablet Take 81 mg by mouth daily.   Yes Historical Provider, MD  cyclobenzaprine (FLEXERIL) 10 MG tablet TAKE 1 TABLET BY MOUTH AT BEDTIME AS NEEDED 01/24/14  Yes Maurice March, MD  fish oil-omega-3 fatty acids 1000 MG capsule Take 2 g by mouth daily.   Yes Historical Provider, MD  ipratropium (ATROVENT) 0.03 % nasal spray Place 2 sprays into both nostrils 2 (two) times daily. Patient taking differently: Place 2 sprays into both nostrils 2 (two) times  daily as needed.  06/29/13  Yes Chelle S Jeffery, PA-C  levothyroxine (SYNTHROID, LEVOTHROID) 50 MCG tablet TAKE 1 TABLET (50 MCG TOTAL) BY MOUTH DAILY. 03/10/14  Yes Maurice March, MD  metFORMIN (GLUCOPHAGE) 500 MG tablet TAKE 1 TABLET DAILY WITH YOUR LARGEST MEAL. 01/24/14  Yes Maurice March, MD  milk thistle 175 MG tablet Take 175 mg by mouth daily.   Yes Historical Provider, MD  Misc Natural Products (RED [place] COMBINATION PO) Take 200 mg by mouth daily.   Yes Historical Provider, MD  Multiple Vitamin (MULTIVITAMIN) capsule Take 1 capsule by mouth daily.   Yes Historical Provider, MD  RED CLOVER LEAF EXTRACT PO Take by mouth daily.   Yes Historical Provider, MD  valACYclovir (VALTREX) 1000 MG tablet Take 1 tablet (1,000 mg total) by mouth 2 (two) times daily. 01/24/14  Yes Maurice March, MD  vitamin B-12 (CYANOCOBALAMIN) 500 MCG tablet Take 500 mcg by mouth daily.   Yes Historical Provider, MD   Past Surgical History  Procedure Laterality Date  . Cholecystectomy    . Tublation  2001    History   Social History  . Marital Status: Married    Spouse Name: [name]  . Number of Children: 1  . 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 Smoking status: Former Smoker    Quit date: 05/13/1996  . Smokeless tobacco: Never Used  . Alcohol Use: Yes  Comment: occassionally  . Drug Use: Yes    Special: Marijuana     Comment: occassionally  . Sexual Activity: Yes   Other Topics Concern  . Not on file   Social History Narrative   Patient is married [name]) and lives at home with her husband.   Patient has one adult child.   Patient is right-handed.   Patient does not drink any caffeine.   Married. Education: some college. Exercise: yes.    Family History  Problem Relation Age of Onset  . Diabetes Mother   . Hyperlipidemia Mother   . Cancer Father   . Diabetes Sister   . Hyperlipidemia Sister       Review of Systems  Constitutional: Negative.   HENT: Positive for congestion, postnasal drip, rhinorrhea, sinus pressure and sore throat. Negative for facial swelling, nosebleeds, trouble swallowing and voice change.   Eyes: Negative.   Respiratory: Negative.   Cardiovascular: Negative.   Musculoskeletal: Negative.   Psychiatric/Behavioral: Negative.       Objective:   Physical Exam  Constitutional: She is oriented to person, place, and time. Vital signs are normal. She appears well-developed and well-nourished. No distress.  Blood pressure 120/76, pulse 58, temperature 98.4 F (36.9 C), temperature source Oral, resp. rate 16, height  (1.676 m), weight 206 lb 3.2 oz (93.532 kg), last menstrual period 08/31/2012, SpO2 98 %.   HENT:  Head: Normocephalic and atraumatic.  Right Ear: Hearing, external ear and ear canal normal. Tympanic membrane is not injected and not erythematous. A middle ear effusion is present.  Left Ear: Hearing, tympanic membrane, external ear and ear canal normal. Tympanic membrane is not injected and not erythematous.  No middle ear effusion.  Nose: Mucosal edema present. No rhinorrhea, nasal deformity or septal deviation. Right sinus exhibits no maxillary sinus tenderness and no frontal sinus tenderness. Left sinus exhibits maxillary sinus tenderness. Left sinus exhibits no frontal sinus tenderness.  Mouth/Throat: Uvula is midline and mucous membranes are normal. No oral lesions. Normal dentition. No uvula swelling. Posterior oropharyngeal erythema present. No oropharyngeal exudate.  Eyes: Conjunctivae and EOM are normal. No scleral icterus.  Neck: Normal range of motion. Neck supple.  Cardiovascular: Normal rate, regular rhythm and normal heart sounds.   No murmur heard. Pulmonary/Chest: Effort normal and breath sounds normal. No respiratory distress.  Musculoskeletal: Normal range of motion. She exhibits no edema or tenderness.  Lymphadenopathy:    She  has no cervical adenopathy.  Neurological: She is alert and oriented to person, place, and time. No cranial nerve deficit. Coordination normal.  Skin: Skin is warm and dry. No rash noted. She is not diaphoretic. No erythema. No pallor.  Psychiatric: She has a normal mood and affect. Her behavior is normal. Judgment and thought content normal.  Nursing note and vitals reviewed.   Results for orders placed or performed in visit on 07/25/14  POCT glycosylated hemoglobin (Hb A1C)  Result Value Ref Range   Hemoglobin A1C 6.1       Assessment & Plan:  Type 2 diabetes mellitus without complication - Will discontinue metformin; pt encouraged to maintain weight loss plan w/ good nutrition and regular exercise. Plan: POCT glycosylated hemoglobin (Hb A1C), Basic metabolic panel  Hypothyroidism due to acquired atrophy of thyroid - Stable; check for adequate supplementation. Plan: TSH  Acute recurrent maxillary sinusitis- Continue nasal spray and anti-histamine as well as avoidance of allergens.  Meds ordered this encounter  Medications  . cefUROXime (CEFTIN) 250 MG tablet  Sig: Take 1 tablet (250 mg total) by mouth 2 (two) times daily with a meal.    Dispense:  14 tablet    Refill:  0

## 2014-07-25 NOTE — Patient Instructions (Addendum)
Sinusitis Sinusitis is redness, soreness, and inflammation of the paranasal sinuses. Paranasal sinuses are air pockets within the bones of your face (beneath the eyes, the middle of the forehead, or above the eyes). In healthy paranasal sinuses, mucus is able to drain out, and air is able to circulate through them by way of your nose. However, when your paranasal sinuses are inflamed, mucus and air can become trapped. This can allow bacteria and other germs to grow and cause infection. Sinusitis can develop quickly and last only a short time (acute) or continue over a long period (chronic). Sinusitis that lasts for more than 12 weeks is considered chronic.  CAUSES  Causes of sinusitis include:  Allergies.  Structural abnormalities, such as displacement of the cartilage that separates your nostrils (deviated septum), which can decrease the air flow through your nose and sinuses and affect sinus drainage.  Functional abnormalities, such as when the small hairs (cilia) that line your sinuses and help remove mucus do not work properly or are not present. SIGNS AND SYMPTOMS  Symptoms of acute and chronic sinusitis are the same. The primary symptoms are pain and pressure around the affected sinuses. Other symptoms include:  Upper toothache.  Earache.  Headache.  Bad breath.  Decreased sense of smell and taste.  A cough, which worsens when you are lying flat.  Fatigue.  Fever.  Thick drainage from your nose, which often is green and may contain pus (purulent).  Swelling and warmth over the affected sinuses. DIAGNOSIS  Your health care provider will perform a physical exam. During the exam, your health care provider may:  Look in your nose for signs of abnormal growths in your nostrils (nasal polyps).  Tap over the affected sinus to check for signs of infection.  View the inside of your sinuses (endoscopy) using an imaging device that has a light attached (endoscope). If your health  care provider suspects that you have chronic sinusitis, one or more of the following tests may be recommended:  Allergy tests.  Nasal culture. A sample of mucus is taken from your nose, sent to a lab, and screened for bacteria.  Nasal cytology. A sample of mucus is taken from your nose and examined by your health care provider to determine if your sinusitis is related to an allergy. TREATMENT  Most cases of acute sinusitis are related to a viral infection and will resolve on their own within 10 days. Sometimes medicines are prescribed to help relieve symptoms (pain medicine, decongestants, nasal steroid sprays, or saline sprays).  However, for sinusitis related to a bacterial infection, your health care provider will prescribe antibiotic medicines. These are medicines that will help kill the bacteria causing the infection.  Rarely, sinusitis is caused by a fungal infection. In theses cases, your health care provider will prescribe antifungal medicine. For some cases of chronic sinusitis, surgery is needed. Generally, these are cases in which sinusitis recurs more than 3 times per year, despite other treatments. HOME CARE INSTRUCTIONS   Drink plenty of water. Water helps thin the mucus so your sinuses can drain more easily.  Use a humidifier.  Inhale steam 3 to 4 times a day (for example, sit in the bathroom with the shower running).  Apply a warm, moist washcloth to your face 3 to 4 times a day, or as directed by your health care provider.  Use saline nasal sprays to help moisten and clean your sinuses.  Take medicines only as directed by your health care provider.    If you were prescribed either an antibiotic or antifungal medicine, finish it all even if you start to feel better. SEEK IMMEDIATE MEDICAL CARE IF:  You have increasing pain or severe headaches.  You have nausea, vomiting, or drowsiness.  You have swelling around your face.  You have vision problems.  You have a stiff  neck.  You have difficulty breathing. MAKE SURE YOU:   Understand these instructions.  Will watch your condition.  Will get help right away if you are not doing well or get worse. Document Released: 04/27/2005 Document Revised: 09/11/2013 Document Reviewed: 05/12/2011 [facility] Patient Information 2015 [place] [place]. This information is not intended to replace advice given to you by your health care provider. Make sure you discuss any questions you have with your health care provider.    We have agreed that you can discontinue taking METFORMIN. Follow a healthy nutrition and lifestyle plan. Good luck with ongoing weight loss; I am confident that you will reach your next goal of 190 pounds!  Schedule a follow-up in Sept 2016; that is when you physical exam is due. Below is some information about a healthy lifestyle that promotes weight loss and good mental and physical well being.       Mediterranean Diet  Why follow it? Research shows. . Those who follow the Mediterranean diet have a reduced risk of heart disease  . The diet is associated with a reduced incidence of Parkinson's and Alzheimer's diseases . People following the diet may have longer life expectancies and lower rates of chronic diseases  . The Dietary Guidelines for Americans recommends the Mediterranean diet as an eating plan to promote health and prevent disease  What Is the Mediterranean Diet?  . Healthy eating plan based on typical foods and recipes of Mediterranean-style cooking . The diet is primarily a plant based diet; these foods should make up a majority of meals   Starches - Plant based foods should make up a majority of meals - They are an important sources of vitamins, minerals, energy, antioxidants, and fiber - Choose whole grains, foods high in fiber and minimally processed items  - Typical grain sources include wheat, oats, barley, corn, brown rice, bulgar, farro, millet, polenta, couscous  - Various  types of beans include chickpeas, lentils, fava beans, black beans, white beans   Fruits  Veggies - Large quantities of antioxidant rich fruits & veggies; 6 or more servings  - Vegetables can be eaten raw or lightly drizzled with oil and cooked  - Vegetables common to the traditional Mediterranean Diet include: artichokes, arugula, beets, broccoli, brussel sprouts, cabbage, carrots, celery, collard greens, cucumbers, eggplant, kale, leeks, lemons, lettuce, mushrooms, okra, onions, peas, peppers, potatoes, pumpkin, radishes, rutabaga, shallots, spinach, sweet potatoes, turnips, zucchini - Fruits common to the Mediterranean Diet include: apples, apricots, avocados, cherries, clementines, dates, figs, grapefruits, grapes, melons, nectarines, oranges, peaches, pears, pomegranates, strawberries, tangerines  Fats - Replace butter and margarine with healthy oils, such as olive oil, canola oil, and tahini  - Limit nuts to no more than a handful a day  - Nuts include walnuts, almonds, pecans, pistachios, pine nuts  - Limit or avoid candied, honey roasted or heavily salted nuts - Olives are central to the [employer]iterranean diet - can be eaten whole or used in a variety of dishes   Meats Protein - Limiting red meat: no more than a few times a month - When eating red meat: choose lean cuts and keep the portion to the size  of deck of cards - Eggs: approx. 0 to 4 times a week  - Fish and lean poultry: at least 2 a week  - Healthy protein sources include, chicken, [place], lean beef, lamb - Increase intake of seafood such as tuna, salmon, trout, mackerel, shrimp, scallops - Avoid or limit high fat processed meats such as sausage and bacon  Dairy - Include moderate amounts of low fat dairy products  - Focus on healthy dairy such as fat free yogurt, skim milk, low or reduced fat cheese - Limit dairy products higher in fat such as whole or 2% milk, cheese, ice cream  Alcohol - Moderate amounts of red wine is ok  - No  more than 5 oz daily for women (all ages) and men older than age 82  - No more than 10 oz of wine daily for men younger than 53  Other - Limit sweets and other desserts  - Use herbs and spices instead of salt to flavor foods  - Herbs and spices common to the traditional Mediterranean Diet include: basil, bay leaves, chives, cloves, cumin, fennel, garlic, lavender, marjoram, mint, oregano, parsley, pepper, rosemary, sage, savory, sumac, tarragon, thyme   It's not just a diet, it's a lifestyle:  . The Mediterranean diet includes lifestyle factors typical of those in the region  . Foods, drinks and meals are best eaten with others and savored . Daily physical activity is important for overall good health . This could be strenuous exercise like running and aerobics . This could also be more leisurely activities such as walking, housework, yard-work, or taking the stairs . Moderation is the key; a balanced and healthy diet accommodates most foods and drinks . Consider portion sizes and frequency of consumption of certain foods   Meal Ideas & Options:  . Breakfast:  o Whole wheat toast or whole wheat English muffins with peanut butter & hard boiled egg o Steel cut oats topped with apples & cinnamon and skim milk  o Fresh fruit: banana, strawberries, melon, berries, peaches  o Smoothies: strawberries, bananas, greek yogurt, peanut butter o Low fat greek yogurt with blueberries and granola  o Egg white omelet with spinach and mushrooms o Breakfast couscous: whole wheat couscous, apricots, skim milk, cranberries  . Sandwiches:  o Hummus and grilled vegetables (peppers, zucchini, squash) on whole wheat bread   o Grilled chicken on whole wheat pita with lettuce, tomatoes, cucumbers or tzatziki  o Tuna salad on whole wheat bread: tuna salad made with greek yogurt, olives, red peppers, capers, green onions o Garlic rosemary lamb pita: lamb sauted with garlic, rosemary, salt & pepper; add lettuce,  cucumber, greek yogurt to pita - flavor with lemon juice and black pepper  . Seafood:  o Mediterranean grilled salmon, seasoned with garlic, basil, parsley, lemon juice and black pepper o Shrimp, lemon, and spinach whole-grain pasta salad made with low fat greek yogurt  o Seared scallops with lemon orzo  o Seared tuna steaks seasoned salt, pepper, coriander topped with tomato mixture of olives, tomatoes, olive oil, minced garlic, parsley, green onions and cappers  . Meats:  o Herbed greek chicken salad with kalamata olives, cucumber, feta  o Red bell peppers stuffed with spinach, bulgur, lean ground beef (or lentils) & topped with feta   o Kebabs: skewers of chicken, tomatoes, onions, zucchini, squash  o [place] burgers: made with red onions, mint, dill, lemon juice, feta cheese topped with roasted red peppers . Vegetarian o Cucumber salad: cucumbers, artichoke hearts,  celery, red onion, feta cheese, tossed in olive oil & lemon juice  o Hummus and whole grain pita points with a greek salad (lettuce, tomato, feta, olives, cucumbers, red onion) o Lentil soup with celery, carrots made with vegetable broth, garlic, salt and pepper  o Tabouli salad: parsley, bulgur, mint, scallions, cucumbers, tomato, radishes, lemon juice, olive oil, salt and pepper. o

## 2014-07-27 NOTE — Progress Notes (Signed)
Quick Note:  Please notify pt that results are normal.   Provide pt with copy of labs. ______ 

## 2014-07-28 ENCOUNTER — Encounter: Payer: Self-pay | Admitting: *Deleted

## 2014-08-06 ENCOUNTER — Encounter: Payer: Self-pay | Admitting: Neurology

## 2014-09-26 ENCOUNTER — Encounter: Payer: Self-pay | Admitting: Family Medicine

## 2014-10-06 ENCOUNTER — Other Ambulatory Visit: Payer: Self-pay | Admitting: Physician Assistant

## 2014-12-31 ENCOUNTER — Ambulatory Visit (INDEPENDENT_AMBULATORY_CARE_PROVIDER_SITE_OTHER): Payer: BLUE CROSS/BLUE SHIELD | Admitting: Physician Assistant

## 2014-12-31 VITALS — BP 140/90 | HR 66 | Temp 98.5°F | Resp 16 | Ht 66.0 in | Wt 213.8 lb

## 2014-12-31 DIAGNOSIS — R1030 Lower abdominal pain, unspecified: Secondary | ICD-10-CM | POA: Diagnosis not present

## 2014-12-31 DIAGNOSIS — R1031 Right lower quadrant pain: Secondary | ICD-10-CM

## 2014-12-31 DIAGNOSIS — M545 Low back pain, unspecified: Secondary | ICD-10-CM

## 2014-12-31 DIAGNOSIS — R1032 Left lower quadrant pain: Secondary | ICD-10-CM

## 2014-12-31 LAB — POCT CBC
GRANULOCYTE PERCENT: 50.4 % (ref 37–80)
HCT, POC: 41.7 % (ref 37.7–47.9)
Hemoglobin: 13.7 g/dL (ref 12.2–16.2)
Lymph, poc: 2.6 (ref 0.6–3.4)
MCH: 28.4 pg (ref 27–31.2)
MCHC: 32.8 g/dL (ref 31.8–35.4)
MCV: 86.6 fL (ref 80–97)
MID (CBC): 0.6 (ref 0–0.9)
MPV: 8.1 fL (ref 0–99.8)
POC Granulocyte: 3.3 (ref 2–6.9)
POC LYMPH PERCENT: 39.9 %L (ref 10–50)
POC MID %: 9.7 %M (ref 0–12)
Platelet Count, POC: 229 10*3/uL (ref 142–424)
RBC: 4.81 M/uL (ref 4.04–5.48)
RDW, POC: 13.1 %
WBC: 6.6 10*3/uL (ref 4.6–10.2)

## 2014-12-31 LAB — POCT URINALYSIS DIPSTICK
Bilirubin, UA: NEGATIVE
Blood, UA: NEGATIVE
Glucose, UA: NEGATIVE
KETONES UA: NEGATIVE
NITRITE UA: NEGATIVE
PH UA: 5
PROTEIN UA: NEGATIVE
Spec Grav, UA: 1.02
UROBILINOGEN UA: 0.2

## 2014-12-31 LAB — POCT UA - MICROSCOPIC ONLY
BACTERIA, U MICROSCOPIC: NEGATIVE
Casts, Ur, LPF, POC: NEGATIVE
Crystals, Ur, HPF, POC: NEGATIVE
MUCUS UA: NEGATIVE
RBC, urine, microscopic: NEGATIVE
Yeast, UA: NEGATIVE

## 2014-12-31 MED ORDER — CYCLOBENZAPRINE HCL 10 MG PO TABS: ORAL_TABLET | ORAL | Status: DC

## 2014-12-31 MED ORDER — MELOXICAM 7.5 MG PO TABS: 7.5000 mg | ORAL_TABLET | Freq: Every day | ORAL | Status: DC

## 2014-12-31 NOTE — Progress Notes (Signed)
Subjective:    Patient ID: Alejandra Ray, female    DOB: 01-12-1961, 54 y.o.   MRN: [id number]  Chief Complaint  Patient presents with  . Back Pain    lower back pain, extends to abdominal area, x 1 day   Medications, allergies, past medical history, surgical history, family history, social history and problem list reviewed and updated.  HPI  43 yof presents with low back pain/abd pain.   Sx started while at work 2 days ago. Had been sitting for approx one hour then stood up felt suden onset midline low back pain, 10/10. Tried different positions with no relief. Pain persistent all day. Took flexeril when got home which eased it a bit. Yest pain radiated around bilateral hips into bilateral groin. Pain 9.5/10 at rest and 10/10 with movement. Pain now bilateral low back, bilateral hips, and bilateral groin. Flexeril helped yest as well. Went to [employer] who adjusted her which helped. States chiro told her pain is likely muscular and not skeletal.   Denies dysuria, urinary freq/urgency, hematuria, vaginal dc/odor, emesis, diarrhea, fevers or chills. Mild nausea yest. No pain down either leg. Denies bowel/bladder dysfx or perianal los.   Concerned she will not be able to work with the degree of pain. Mentions she needs work note.   She is one year postmenopausal.   Review of Systems See HPI     Objective:   Physical Exam  Constitutional: She appears well-developed and well-nourished.  Non-toxic appearance. She does not have a sickly appearance. She does not appear ill. No distress.  BP 140/90 mmHg  Pulse 66  Temp(Src) 98.5 F (36.9 C) (Oral)  Resp 16  Ht  (1.676 m)  Wt 213 lb 12.8 oz (96.979 kg)  BMI 34.52 kg/m2  SpO2 98%  LMP 08/31/2012   Abdominal: Soft. Normal appearance and bowel sounds are normal. There is generalized tenderness. There is no rigidity, no rebound, no guarding, no CVA tenderness, no tenderness at McBurney's point and negative Murphy's sign. No  hernia.  Mild generalized abd ttp, slightly more severe suprapubic ttp.   Musculoskeletal:       Right hip: She exhibits tenderness.       Left hip: She exhibits tenderness.       Lumbar back: She exhibits tenderness and spasm. She exhibits no bony tenderness.       Back:  Bilateral lumbar paraspinal tenderness with small spasm left side. Negative SLR bilaterally. Decreased rom with extension and lateral rotation due to low back pain.   Mild anterior hip ttp bilaterally. Full joint rom. Normal gait.   Lymphadenopathy:       Right: No inguinal adenopathy present.       Left: No inguinal adenopathy present.  Psychiatric: She has a normal mood and affect. Her speech is normal and behavior is normal.   Results for orders placed or performed in visit on 12/31/14  POCT urinalysis dipstick  Result Value Ref Range   Color, UA yellow    Clarity, UA clear    Glucose, UA neg    Bilirubin, UA neg    Ketones, UA neg    Spec Grav, UA 1.020    Blood, UA neg    pH, UA 5.0    Protein, UA neg    Urobilinogen, UA 0.2    Nitrite, UA neg    Leukocytes, UA Trace (A) Negative  POCT UA - Microscopic Only  Result Value Ref Range   WBC, Ur, HPF,  POC 0-2    RBC, urine, microscopic neg    Bacteria, U Microscopic neg    Mucus, UA neg    Epithelial cells, urine per micros 0-2    Crystals, Ur, HPF, POC neg    Casts, Ur, LPF, POC neg    Yeast, UA neg   POCT CBC  Result Value Ref Range   WBC 6.6 4.6 - 10.2 K/uL   Lymph, poc 2.6 0.6 - 3.4   POC LYMPH PERCENT 39.9 10 - 50 %L   MID (cbc) 0.6 0 - 0.9   POC MID % 9.7 0 - 12 %M   POC Granulocyte 3.3 2 - 6.9   Granulocyte percent 50.4 37 - 80 %G   RBC 4.81 4.04 - 5.48 M/uL   Hemoglobin 13.7 12.2 - 16.2 g/dL   HCT, POC 96.0 45.4 - 47.9 %   MCV 86.6 80 - 97 fL   MCH, POC 28.4 27 - 31.2 pg   MCHC 32.8 31.8 - 35.4 g/dL   RDW, POC 09.8 %   Platelet Count, POC 229 142 - 424 K/uL   MPV 8.1 0 - 99.8 fL       Assessment & Plan:   Bilateral low back  pain without sciatica - Plan: POCT urinalysis dipstick, POCT UA - Microscopic Only, POCT CBC  Bilateral groin pain - Plan: POCT urinalysis dipstick, POCT UA - Microscopic Only, POCT CBC --normal ua, cbc --suspect lumbar strain with radiation as she denies vaginal or abd sx, denies fevers/chills, pain is worse with movement --flexeril/mobic/rehab exercises given to start in few days once pain improved --if no improvement rtc, possible xr at that time  --rtc asap/er with persistent fevers/chills, severe abd pain, dysuria, hematuria  Donnajean Lopes, PA-C Physician Assistant-Certified [facility]  12/31/2014 11:06 AM

## 2014-12-31 NOTE — Patient Instructions (Signed)
Your urine sample and blood sample were both normal which is reassuring.  You most likely strained some of your low back muscles which is causing you pain.  You do have a small muscle spasm in your low back.  Please take the flexeril every 8 hours as needed for the spasm. Please take the mobic once daily for inflammation and pain. Once you're feeling better in a few days please start doing the low back exercises attached. If you're not feeling better in 1-2 weeks please come back to see [place]. If you start having fevers, chills, painful urination, blood in the urine, or severe abdominal pain please come back to see [place] asap.   Low Back Strain with Rehab A strain is an injury in which a tendon or muscle is torn. The muscles and tendons of the lower back are vulnerable to strains. However, these muscles and tendons are very strong and require a great force to be injured. Strains are classified into three categories. Grade 1 strains cause pain, but the tendon is not lengthened. Grade 2 strains include a lengthened ligament, due to the ligament being stretched or partially ruptured. With grade 2 strains there is still function, although the function may be decreased. Grade 3 strains involve a complete tear of the tendon or muscle, and function is usually impaired. SYMPTOMS   Pain in the lower back.  Pain that affects one side more than the other.  Pain that gets worse with movement and may be felt in the hip, buttocks, or back of the thigh.  Muscle spasms of the muscles in the back.  Swelling along the muscles of the back.  Loss of strength of the back muscles.  Crackling sound (crepitation) when the muscles are touched. CAUSES  Lower back strains occur when a force is placed on the muscles or tendons that is greater than they can handle. Common causes of injury include:  Prolonged overuse of the muscle-tendon units in the lower back, usually from incorrect posture.  A single violent injury  or force applied to the back. RISK INCREASES WITH:  Sports that involve twisting forces on the spine or a lot of bending at the waist (football, rugby, weightlifting, bowling, golf, tennis, speed skating, racquetball, swimming, running, gymnastics, diving).  Poor strength and flexibility.  Failure to warm up properly before activity.  Family history of lower back pain or disk disorders.  Previous back injury or surgery (especially fusion).  Poor posture with lifting, especially heavy objects.  Prolonged sitting, especially with poor posture. PREVENTION   Learn and use proper posture when sitting or lifting (maintain proper posture when sitting, lift using the knees and legs, not at the waist).  Warm up and stretch properly before activity.  Allow for adequate recovery between workouts.  Maintain physical fitness:  Strength, flexibility, and endurance.  Cardiovascular fitness. PROGNOSIS  If treated properly, lower back strains usually heal within 6 weeks. RELATED COMPLICATIONS   Recurring symptoms, resulting in a chronic problem.  Chronic inflammation, scarring, and partial muscle-tendon tear.  Delayed healing or resolution of symptoms.  Prolonged disability. TREATMENT  Treatment first involves the use of ice and medicine, to reduce pain and inflammation. The use of strengthening and stretching exercises may help reduce pain with activity. These exercises may be performed at home or with a therapist. Severe injuries may require referral to a therapist for further evaluation and treatment, such as ultrasound. Your caregiver may advise that you wear a back brace or corset, to help  reduce pain and discomfort. Often, prolonged bed rest results in greater harm then benefit. Corticosteroid injections may be recommended. However, these should be reserved for the most serious cases. It is important to avoid using your back when lifting objects. At night, sleep on your back on a firm  mattress with a pillow placed under your knees. If non-surgical treatment is unsuccessful, surgery may be needed.  MEDICATION   If pain medicine is needed, nonsteroidal anti-inflammatory medicines (aspirin and ibuprofen), or other minor pain relievers (acetaminophen), are often advised.  Do not take pain medicine for 7 days before surgery.  Prescription pain relievers may be given, if your caregiver thinks they are needed. Use only as directed and only as much as you need.  Ointments applied to the skin may be helpful.  Corticosteroid injections may be given by your caregiver. These injections should be reserved for the most serious cases, because they may only be given a certain number of times. HEAT AND COLD  Cold treatment (icing) should be applied for 10 to 15 minutes every 2 to 3 hours for inflammation and pain, and immediately after activity that aggravates your symptoms. Use ice packs or an ice massage.  Heat treatment may be used before performing stretching and strengthening activities prescribed by your caregiver, physical therapist, or athletic trainer. Use a heat pack or a warm water soak. SEEK MEDICAL CARE IF:   Symptoms get worse or do not improve in 2 to 4 weeks, despite treatment.  You develop numbness, weakness, or loss of bowel or bladder function.  New, unexplained symptoms develop. (Drugs used in treatment may produce side effects.) EXERCISES  RANGE OF MOTION (ROM) AND STRETCHING EXERCISES - Low Back Strain Most people with lower back pain will find that their symptoms get worse with excessive bending forward (flexion) or arching at the lower back (extension). The exercises which will help resolve your symptoms will focus on the opposite motion.  Your physician, physical therapist or athletic trainer will help you determine which exercises will be most helpful to resolve your lower back pain. Do not complete any exercises without first consulting with your caregiver.  Discontinue any exercises which make your symptoms worse until you speak to your caregiver.  If you have pain, numbness or tingling which travels down into your buttocks, leg or foot, the goal of the therapy is for these symptoms to move closer to your back and eventually resolve. Sometimes, these leg symptoms will get better, but your lower back pain may worsen. This is typically an indication of progress in your rehabilitation. Be very alert to any changes in your symptoms and the activities in which you participated in the 24 hours prior to the change. Sharing this information with your caregiver will allow him/her to most efficiently treat your condition.  These exercises may help you when beginning to rehabilitate your injury. Your symptoms may resolve with or without further involvement from your physician, physical therapist or athletic trainer. While completing these exercises, remember:  Restoring tissue flexibility helps normal motion to return to the joints. This allows healthier, less painful movement and activity.  An effective stretch should be held for at least 30 seconds.  A stretch should never be painful. You should only feel a gentle lengthening or release in the stretched tissue. FLEXION RANGE OF MOTION AND STRETCHING EXERCISES: STRETCH - Flexion, Single Knee to Chest   Lie on a firm bed or floor with both legs extended in front of you.  Keeping one  leg in contact with the floor, bring your opposite knee to your chest. Hold your leg in place by either grabbing behind your thigh or at your knee.  Pull until you feel a gentle stretch in your lower back. Hold __________ seconds.  Slowly release your grasp and repeat the exercise with the opposite side. Repeat __________ times. Complete this exercise __________ times per day.  STRETCH - Flexion, Double Knee to Chest   Lie on a firm bed or floor with both legs extended in front of you.  Keeping one leg in contact with the  floor, bring your opposite knee to your chest.  Tense your stomach muscles to support your back and then lift your other knee to your chest. Hold your legs in place by either grabbing behind your thighs or at your knees.  Pull both knees toward your chest until you feel a gentle stretch in your lower back. Hold __________ seconds.  Tense your stomach muscles and slowly return one leg at a time to the floor. Repeat __________ times. Complete this exercise __________ times per day.  STRETCH - Low Trunk Rotation  Lie on a firm bed or floor. Keeping your legs in front of you, bend your knees so they are both pointed toward the ceiling and your feet are flat on the floor.  Extend your arms out to the side. This will stabilize your upper body by keeping your shoulders in contact with the floor.  Gently and slowly drop both knees together to one side until you feel a gentle stretch in your lower back. Hold for __________ seconds.  Tense your stomach muscles to support your lower back as you bring your knees back to the starting position. Repeat the exercise to the other side. Repeat __________ times. Complete this exercise __________ times per day  EXTENSION RANGE OF MOTION AND FLEXIBILITY EXERCISES: STRETCH - Extension, Prone on Elbows   Lie on your stomach on the floor, a bed will be too soft. Place your palms about shoulder width apart and at the height of your head.  Place your elbows under your shoulders. If this is too painful, stack pillows under your chest.  Allow your body to relax so that your hips drop lower and make contact more completely with the floor.  Hold this position for __________ seconds.  Slowly return to lying flat on the floor. Repeat __________ times. Complete this exercise __________ times per day.  RANGE OF MOTION - Extension, Prone Press Ups  Lie on your stomach on the floor, a bed will be too soft. Place your palms about shoulder width apart and at the height  of your head.  Keeping your back as relaxed as possible, slowly straighten your elbows while keeping your hips on the floor. You may adjust the placement of your hands to maximize your comfort. As you gain motion, your hands will come more underneath your shoulders.  Hold this position __________ seconds.  Slowly return to lying flat on the floor. Repeat __________ times. Complete this exercise __________ times per day.  RANGE OF MOTION- Quadruped, Neutral Spine   Assume a hands and knees position on a firm surface. Keep your hands under your shoulders and your knees under your hips. You may place padding under your knees for comfort.  Drop your head and point your tail bone toward the ground below you. This will round out your lower back like an angry cat. Hold this position for __________ seconds.  Slowly lift your head  and release your tail bone so that your back sags into a large arch, like an old horse.  Hold this position for __________ seconds.  Repeat this until you feel limber in your lower back.  Now, find your "sweet spot." This will be the most comfortable position somewhere between the two previous positions. This is your neutral spine. Once you have found this position, tense your stomach muscles to support your lower back.  Hold this position for __________ seconds. Repeat __________ times. Complete this exercise __________ times per day.  STRENGTHENING EXERCISES - Low Back Strain These exercises may help you when beginning to rehabilitate your injury. These exercises should be done near your "sweet spot." This is the neutral, low-back arch, somewhere between fully rounded and fully arched, that is your least painful position. When performed in this safe range of motion, these exercises can be used for people who have either a flexion or extension based injury. These exercises may resolve your symptoms with or without further involvement from your physician, physical therapist  or athletic trainer. While completing these exercises, remember:   Muscles can gain both the endurance and the strength needed for everyday activities through controlled exercises.  Complete these exercises as instructed by your physician, physical therapist or athletic trainer. Increase the resistance and repetitions only as guided.  You may experience muscle soreness or fatigue, but the pain or discomfort you are trying to eliminate should never worsen during these exercises. If this pain does worsen, stop and make certain you are following the directions exactly. If the pain is still present after adjustments, discontinue the exercise until you can discuss the trouble with your caregiver. STRENGTHENING - Deep Abdominals, Pelvic Tilt  Lie on a firm bed or floor. Keeping your legs in front of you, bend your knees so they are both pointed toward the ceiling and your feet are flat on the floor.  Tense your lower abdominal muscles to press your lower back into the floor. This motion will rotate your pelvis so that your tail bone is scooping upwards rather than pointing at your feet or into the floor.  With a gentle tension and even breathing, hold this position for __________ seconds. Repeat __________ times. Complete this exercise __________ times per day.  STRENGTHENING - Abdominals, Crunches   Lie on a firm bed or floor. Keeping your legs in front of you, bend your knees so they are both pointed toward the ceiling and your feet are flat on the floor. Cross your arms over your chest.  Slightly tip your chin down without bending your neck.  Tense your abdominals and slowly lift your trunk high enough to just clear your shoulder blades. Lifting higher can put excessive stress on the lower back and does not further strengthen your abdominal muscles.  Control your return to the starting position. Repeat __________ times. Complete this exercise __________ times per day.  STRENGTHENING -  Quadruped, Opposite UE/LE Lift   Assume a hands and knees position on a firm surface. Keep your hands under your shoulders and your knees under your hips. You may place padding under your knees for comfort.  Find your neutral spine and gently tense your abdominal muscles so that you can maintain this position. Your shoulders and hips should form a rectangle that is parallel with the floor and is not twisted.  Keeping your trunk steady, lift your right hand no higher than your shoulder and then your left leg no higher than your hip. Make sure  you are not holding your breath. Hold this position __________ seconds.  Continuing to keep your abdominal muscles tense and your back steady, slowly return to your starting position. Repeat with the opposite arm and leg. Repeat __________ times. Complete this exercise __________ times per day.  STRENGTHENING - Lower Abdominals, Double Knee Lift  Lie on a firm bed or floor. Keeping your legs in front of you, bend your knees so they are both pointed toward the ceiling and your feet are flat on the floor.  Tense your abdominal muscles to brace your lower back and slowly lift both of your knees until they come over your hips. Be certain not to hold your breath.  Hold __________ seconds. Using your abdominal muscles, return to the starting position in a slow and controlled manner. Repeat __________ times. Complete this exercise __________ times per day.  POSTURE AND BODY MECHANICS CONSIDERATIONS - Low Back Strain Keeping correct posture when sitting, standing or completing your activities will reduce the stress put on different body tissues, allowing injured tissues a chance to heal and limiting painful experiences. The following are general guidelines for improved posture. Your physician or physical therapist will provide you with any instructions specific to your needs. While reading these guidelines, remember:  The exercises prescribed by your provider will  help you have the flexibility and strength to maintain correct postures.  The correct posture provides the best environment for your joints to work. All of your joints have less wear and tear when properly supported by a spine with good posture. This means you will experience a healthier, less painful body.  Correct posture must be practiced with all of your activities, especially prolonged sitting and standing. Correct posture is as important when doing repetitive low-stress activities (typing) as it is when doing a single heavy-load activity (lifting). RESTING POSITIONS Consider which positions are most painful for you when choosing a resting position. If you have pain with flexion-based activities (sitting, bending, stooping, squatting), choose a position that allows you to rest in a less flexed posture. You would want to avoid curling into a fetal position on your side. If your pain worsens with extension-based activities (prolonged standing, working overhead), avoid resting in an extended position such as sleeping on your stomach. Most people will find more comfort when they rest with their spine in a more neutral position, neither too rounded nor too arched. Lying on a non-sagging bed on your side with a pillow between your knees, or on your back with a pillow under your knees will often provide some relief. Keep in mind, being in any one position for a prolonged period of time, no matter how correct your posture, can still lead to stiffness. PROPER SITTING POSTURE In order to minimize stress and discomfort on your spine, you must sit with correct posture. Sitting with good posture should be effortless for a healthy body. Returning to good posture is a gradual process. Many people can work toward this most comfortably by using various supports until they have the flexibility and strength to maintain this posture on their own. When sitting with proper posture, your ears will fall over your shoulders  and your shoulders will fall over your hips. You should use the back of the chair to support your upper back. Your lower back will be in a neutral position, just slightly arched. You may place a small pillow or folded towel at the base of your lower back for support.  When working at a desk, create  an environment that supports good, upright posture. Without extra support, muscles tire, which leads to excessive strain on joints and other tissues. Keep these recommendations in mind: CHAIR:  A chair should be able to slide under your desk when your back makes contact with the back of the chair. This allows you to work closely.  The chair's height should allow your eyes to be level with the upper part of your monitor and your hands to be slightly lower than your elbows. BODY POSITION  Your feet should make contact with the floor. If this is not possible, use a foot rest.  Keep your ears over your shoulders. This will reduce stress on your neck and lower back. INCORRECT SITTING POSTURES  If you are feeling tired and unable to assume a healthy sitting posture, do not slouch or slump. This puts excessive strain on your back tissues, causing more damage and pain. Healthier options include:  Using more support, like a lumbar pillow.  Switching tasks to something that requires you to be upright or walking.  Talking a brief walk.  Lying down to rest in a neutral-spine position. PROLONGED STANDING WHILE SLIGHTLY LEANING FORWARD  When completing a task that requires you to lean forward while standing in one place for a long time, place either foot up on a stationary 2-4 inch high object to help maintain the best posture. When both feet are on the ground, the lower back tends to lose its slight inward curve. If this curve flattens (or becomes too large), then the back and your other joints will experience too much stress, tire more quickly, and can cause pain. CORRECT STANDING POSTURES Proper standing  posture should be assumed with all daily activities, even if they only take a few moments, like when brushing your teeth. As in sitting, your ears should fall over your shoulders and your shoulders should fall over your hips. You should keep a slight tension in your abdominal muscles to brace your spine. Your tailbone should point down to the ground, not behind your body, resulting in an over-extended swayback posture.  INCORRECT STANDING POSTURES  Common incorrect standing postures include a forward head, locked knees and/or an excessive swayback. WALKING Walk with an upright posture. Your ears, shoulders and hips should all line-up. PROLONGED ACTIVITY IN A FLEXED POSITION When completing a task that requires you to bend forward at your waist or lean over a low surface, try to find a way to stabilize 3 out of 4 of your limbs. You can place a hand or elbow on your thigh or rest a knee on the surface you are reaching across. This will provide you more stability so that your muscles do not fatigue as quickly. By keeping your knees relaxed, or slightly bent, you will also reduce stress across your lower back. CORRECT LIFTING TECHNIQUES DO :   Assume a wide stance. This will provide you more stability and the opportunity to get as close as possible to the object which you are lifting.  Tense your abdominals to brace your spine. Bend at the knees and hips. Keeping your back locked in a neutral-spine position, lift using your leg muscles. Lift with your legs, keeping your back straight.  Test the weight of unknown objects before attempting to lift them.  Try to keep your elbows locked down at your sides in order get the best strength from your shoulders when carrying an object.  Always ask for help when lifting heavy or awkward objects. INCORRECT LIFTING  TECHNIQUES DO NOT:   Lock your knees when lifting, even if it is a small object.  Bend and twist. Pivot at your feet or move your feet when  needing to change directions.  Assume that you can safely pick up even a paper clip without proper posture. Document Released: 04/27/2005 Document Revised: 07/20/2011 Document Reviewed: 08/09/2008 [facility] Patient Information 2015 [place], [place]. This information is not intended to replace advice given to you by your health care provider. Make sure you discuss any questions you have with your health care provider.

## 2015-01-02 ENCOUNTER — Ambulatory Visit (INDEPENDENT_AMBULATORY_CARE_PROVIDER_SITE_OTHER): Payer: BLUE CROSS/BLUE SHIELD

## 2015-01-02 ENCOUNTER — Telehealth: Payer: Self-pay

## 2015-01-02 ENCOUNTER — Ambulatory Visit (INDEPENDENT_AMBULATORY_CARE_PROVIDER_SITE_OTHER): Payer: BLUE CROSS/BLUE SHIELD | Admitting: Emergency Medicine

## 2015-01-02 VITALS — BP 135/93 | HR 76 | Temp 98.1°F | Resp 18 | Ht 66.0 in | Wt 216.0 lb

## 2015-01-02 DIAGNOSIS — M545 Low back pain, unspecified: Secondary | ICD-10-CM

## 2015-01-02 NOTE — Progress Notes (Addendum)
Subjective:  This chart was scribed for Alejandra Chris MD, by Alejandra Ray, at [facility].  This patient was seen in room 13 and the patient's care was started at 10:24 AM.    Patient ID: Alejandra Ray, female    DOB: 1960-06-05, 54 y.o.   MRN: [id number] Chief Complaint  Patient presents with  . Follow-up    pt is here for recheck of her back pain, leg and stomach pain; she states she is still hurting  . Back Pain    lower (buttock) area pain  . Abdominal Pain    lower stomach pain where pull muscle is located  . Leg Pain    right leg more than the left leg; states the pain is in the front of the leg    HPI  HPI Comments: Alejandra Ray is a 54 y.o. female who presents to the [facility] for a follow up. Patient was seen by Tawanna Cooler (PA) two days ago for bilateral back pain and groin pain here at [facility].  It was treated with Flexeril and Mobic at that time. She feels like they have helped mildly as she is not in as much pain but is still having difficulty with walking and standing up.  She believes that the pain started from carrying her grandchildren and working that weekend.  She goes to a chiropractor regularly and had some adjustments done but they assumed that it was more of a pulled muscle.  She denies any fevers/chills, nausea/vomitting, dysuria, bowel incontinence/frequency/urgencry, loss of appetite.  She is willing to stay out of work until Tuesday and seeing how her pain is at that time.    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  She states that she is having a lot of difficulty getting up from her chair and it is slowing her down significantly at work.     Patient Active Problem List   Diagnosis Date Noted  . OSA on CPAP 06/18/2014  . UARS (upper airway resistance syndrome) 06/18/2014  . Obesity (BMI 30.0-34.9) 06/18/2014  . Snoring 03/15/2014  . Stress headaches 03/15/2014  . Hypothyroidism 01/24/2014    . Other screening mammogram 08/23/2013  . Perimenopausal 08/03/2011  . SUI (stress urinary incontinence, female) 08/03/2011  . Allergic rhinitis 08/03/2011  . 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 RLS (restless legs syndrome) 08/03/2011  . Hyperlipidemia   . Type 2 diabetes mellitus    Past Medical History  Diagnosis Date  . Diabetes mellitus   . Hyperlipidemia   . Type 2 diabetes mellitus   . Allergic rhinitis   . SUI (stress urinary incontinence), female    Past Surgical History  Procedure Laterality Date  . Cholecystectomy    . Tublation  2001   Allergies  Allergen Reactions  . Codeine     Blacked out  . Other     PLASTICS  . Sulfa Antibiotics Nausea And Vomiting   Prior to Admission medications   Medication Sig Start Date End Date Taking? Authorizing Provider  aspirin 81 MG tablet Take 81 mg by mouth daily.   Yes Historical Provider, MD  cyclobenzaprine (FLEXERIL) 10 MG tablet TAKE 1 TABLET BY MOUTH AT BEDTIME AS NEEDED 12/31/14  Yes Raelyn Ensign, PA  fish oil-omega-3 fatty acids 1000 MG capsule Take 2 g by mouth daily.   Yes Historical Provider, MD  ipratropium (ATROVENT) 0.03 % nasal spray Place 2 sprays into both nostrils 2 (two) times daily.  Patient taking differently: Place 2 sprays into both nostrils 2 (two) times daily as needed.  06/29/13  Yes Chelle Jeffery, PA-C  levothyroxine (SYNTHROID, LEVOTHROID) 50 MCG tablet TAKE 1 TABLET (50 MCG TOTAL) BY MOUTH DAILY. 10/06/14  Yes Chelle Jeffery, PA-C  meloxicam (MOBIC) 7.5 MG tablet Take 1 tablet (7.5 mg total) by mouth daily. 12/31/14  Yes Todd McVeigh, PA  milk thistle 175 MG tablet Take 175 mg by mouth daily.   Yes Historical Provider, MD  Misc Natural Products (RED CLOVER COMBINATION PO) Take 200 mg by mouth daily.   Yes Historical Provider, MD  Multiple Vitamin (MULTIVITAMIN) capsule Take 1 capsule by mouth daily.   Yes Historical Provider, MD  valACYclovir (VALTREX) 1000 MG tablet Take 1 tablet (1,000 mg total) by mouth 2  (two) times daily. 01/24/14  Yes Maurice March, MD  vitamin B-12 (CYANOCOBALAMIN) 500 MCG tablet Take 500 mcg by mouth daily.   Yes Historical Provider, MD   Social History   Social History  . Marital Status: Married    Spouse Name: Alejandra Ray  . Number of Children: 1  . 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 Smoking status: Former Smoker    Quit date: 05/13/1996  . Smokeless tobacco: Never Used  . Alcohol Use: Yes     Comment: occassionally  . Drug Use: Yes    Special: Marijuana     Comment: occassionally  . Sexual Activity: Yes   Other Topics Concern  . Not on file   Social History Narrative   Patient is married Alejandra Ray) and lives at home with her husband.   Patient has one adult child.   Patient is right-handed.   Patient does not drink any caffeine.   Married. Education: some college. Exercise: No       Review of Systems  Constitutional: Negative for fever and chills.  Cardiovascular: Negative for chest pain.  Gastrointestinal: Negative for nausea and vomiting.  Genitourinary: Negative for dysuria, urgency, frequency and difficulty urinating.  Musculoskeletal: Positive for myalgias, back pain and gait problem. Negative for neck pain and neck stiffness.       Objective:   Physical Exam CONSTITUTIONAL: Well developed/well nourished HEAD: Normocephalic/atraumatic EYES: EOMI/PERRL ENMT: Mucous membranes moist NECK: supple no meningeal signs SPINE/BACK: she is tender across the lower back Abdomen: She has significant tenderness across the lower abdomen and needs assistance from sitting posit ition to the exam table.  NEURO: Pt is awake/alert/appropriate, moves all extremitiesx4.  No facial droop.   EXTREMITIES: pulses normal/equal, reflexes in knees and ankles are 2+ motor 5/5.   SKIN: warm, color normal PSYCH: no abnormalities of mood noted, alert and oriented to situation   Filed Vitals:     01/02/15 0939  BP: 135/93  Pulse: 76  Temp: 98.1 F (36.7 C)  TempSrc: Oral  Resp: 18  Height:  (1.676 m)  Weight: 216 lb (97.977 kg)  SpO2: 98%   [facility] (PRIMARY) x-ray report read by Dr. 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         Assessment & Plan:  X-rays suspicious for pars defect. Will continue current medication. She is placed out of work until Monday. We'll recheck Sunday if not significantly better. She may need further imaging of the lumbar spine if she does not improve.I personally performed the services described in this documentation, which was scribed in my presence.  The recorded information has been reviewed and is accurate.  Earl Lites, MD

## 2015-01-02 NOTE — Telephone Encounter (Signed)
[name] from the patient's PT office called and said that the patient is not sure she can go back to work yet.  She was written out through today.  Please call [phone]

## 2015-01-02 NOTE — Telephone Encounter (Signed)
Ok thanks. She will need to come back in to be seen for further workup if the pain is persisting and she thinks she needs to be out longer.

## 2015-01-03 NOTE — Telephone Encounter (Signed)
Spoke with Alejandra Ray and pt came in yesterday.

## 2015-01-07 ENCOUNTER — Ambulatory Visit (INDEPENDENT_AMBULATORY_CARE_PROVIDER_SITE_OTHER): Payer: BLUE CROSS/BLUE SHIELD | Admitting: Physician Assistant

## 2015-01-07 ENCOUNTER — Telehealth: Payer: Self-pay

## 2015-01-07 VITALS — BP 128/82 | HR 83 | Temp 98.3°F | Resp 16 | Ht 66.0 in | Wt 215.8 lb

## 2015-01-07 DIAGNOSIS — R1032 Left lower quadrant pain: Secondary | ICD-10-CM

## 2015-01-07 DIAGNOSIS — M545 Low back pain, unspecified: Secondary | ICD-10-CM

## 2015-01-07 DIAGNOSIS — R1030 Lower abdominal pain, unspecified: Secondary | ICD-10-CM | POA: Diagnosis not present

## 2015-01-07 DIAGNOSIS — R1031 Right lower quadrant pain: Secondary | ICD-10-CM

## 2015-01-07 NOTE — Progress Notes (Signed)
[facility] [address] [place] [address] [phone]  Date:  01/07/2015   Name:  Alejandra Ray   DOB:  28-Mar-1961   MRN:  [id number]  PCP:  [name], MD    Chief Complaint: Follow-up   History of Present Illness:  This is a 54 y.o. female with PMH OSA on CPAP, HLD, HTN, DM2 who is presenting for follow up back and bilateral hip pain. Symptoms began 9 days ago. Was first seen 7 days ago. Was initially treated conservatively for lumbar strain with mobic and flexeril. She returned 2 days later and saw Dr. Cleta Alberts. She had mild improvement of symptoms but did not feel she could return to work. Xray performed at that time which showed chronic changes but nothing acute. She has been going to her chiropractor and massage therapist since which is helping. She overall feels better but still does not feel like she can return to work. 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 There is a pressure to work fast. She continues to have bilateral lower back pain that radiates to bilateral lower abdomen. Left side of back and left abdomen pain not as bad as right back and right abdomen. UA and CBC negative. She denies paresthesias, weakness, problems with bowel/bladder.  Review of Systems:  Review of Systems See HPI  Patient Active Problem List   Diagnosis Date Noted  . OSA on CPAP 06/18/2014  . UARS (upper airway resistance syndrome) 06/18/2014  . Obesity (BMI 30.0-34.9) 06/18/2014  . Snoring 03/15/2014  . Stress headaches 03/15/2014  . Hypothyroidism 01/24/2014  . Other screening mammogram 08/23/2013  . Perimenopausal 08/03/2011  . SUI (stress urinary incontinence, female) 08/03/2011  . Allergic rhinitis 08/03/2011  . 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 RLS (restless legs syndrome) 08/03/2011  . Hyperlipidemia   . Type 2 diabetes mellitus     Prior to Admission medications   Medication Sig Start Date End Date Taking? Authorizing  Provider  aspirin 81 MG tablet Take 81 mg by mouth daily.   Yes Historical Provider, MD  cyclobenzaprine (FLEXERIL) 10 MG tablet TAKE 1 TABLET BY MOUTH AT BEDTIME AS NEEDED 12/31/14  Yes Raelyn Ensign, PA  fish oil-omega-3 fatty acids 1000 MG capsule Take 2 g by mouth daily.   Yes Historical Provider, MD  ipratropium (ATROVENT) 0.03 % nasal spray Place 2 sprays into both nostrils 2 (two) times daily. Patient taking differently: Place 2 sprays into both nostrils 2 (two) times daily as needed.  06/29/13  Yes Chelle Jeffery, PA-C  levothyroxine (SYNTHROID, LEVOTHROID) 50 MCG tablet TAKE 1 TABLET (50 MCG TOTAL) BY MOUTH DAILY. 10/06/14  Yes Chelle Jeffery, PA-C  meloxicam (MOBIC) 7.5 MG tablet Take 1 tablet (7.5 mg total) by mouth daily. 12/31/14  Yes Todd McVeigh, PA  milk thistle 175 MG tablet Take 175 mg by mouth daily.   Yes Historical Provider, MD  Misc Natural Products (RED CLOVER COMBINATION PO) Take 200 mg by mouth daily.   Yes Historical Provider, MD  Multiple Vitamin (MULTIVITAMIN) capsule Take 1 capsule by mouth daily.   Yes Historical Provider, MD  valACYclovir (VALTREX) 1000 MG tablet Take 1 tablet (1,000 mg total) by mouth 2 (two) times daily. 01/24/14  Yes Maurice March, MD  vitamin B-12 (CYANOCOBALAMIN) 500 MCG tablet Take 500 mcg by mouth daily.   Yes Historical Provider, MD    Allergies  Allergen Reactions  . Codeine     Blacked out  .  Other     PLASTICS  . Sulfa Antibiotics Nausea And Vomiting    Past Surgical History  Procedure Laterality Date  . Cholecystectomy    . Tublation  2001    Social History  Substance Use Topics  . Smoking status: Former Smoker    Quit date: 05/13/1996  . Smokeless tobacco: Never Used  . Alcohol Use: Yes     Comment: occassionally    Family History  Problem Relation Age of Onset  . Diabetes Mother   . Hyperlipidemia Mother   . Cancer Father   . Diabetes Sister   . Hyperlipidemia Sister     Medication list has been reviewed and  updated.  Physical Examination:  Physical Exam  Constitutional: She is oriented to person, place, and time. She appears well-developed and well-nourished. No distress.  HENT:  Head: Normocephalic and atraumatic.  Right Ear: Hearing normal.  Left Ear: Hearing normal.  Nose: Nose normal.  Eyes: Conjunctivae and lids are normal. Right eye exhibits no discharge. Left eye exhibits no discharge. No scleral icterus.  Cardiovascular: Normal rate, regular rhythm, normal heart sounds and normal pulses.   No murmur heard. Pulmonary/Chest: Effort normal and breath sounds normal. No respiratory distress. She has no wheezes. She has no rhonchi. She has no rales.  Abdominal: Soft. Normal appearance. There is tenderness (generally tender in lower abdomen, R>L).  Musculoskeletal: Normal range of motion.       Lumbar back: She exhibits tenderness (generally tender in bilateral paraspinal muscles and in upper buttocks). She exhibits normal range of motion and no bony tenderness.  Neurological: She is alert and oriented to person, place, and time. She has normal strength and normal reflexes. No sensory deficit. Gait normal.  Skin: Skin is warm, dry and intact. No lesion and no rash noted.  Psychiatric: She has a normal mood and affect. Her speech is normal and behavior is normal. Thought content normal.    BP 128/82 mmHg  Pulse 83  Temp(Src) 98.3 F (36.8 C) (Oral)  Resp 16  Ht 5\' 6"  (1.676 m)  Wt 215 lb 12.8 oz (97.886 kg)  BMI 34.85 kg/m2  SpO2 98%  LMP 08/31/2012  Assessment and Plan:  1. Bilateral low back pain without sciatica 2. Bilateral groin pain Symptoms overall improving but not yet resolved. Xray 5 days ago negative. UA and CBC negative. 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 Work note provided until 9/6. If she still does not feel well enough to return to work would be fine writing out  another week without seeing her as long as her symptoms have continued to improve and no worsening, otherwise she would need to RTC.   [place] Brackett, MHS [facility]  01/07/2015

## 2015-01-07 NOTE — Telephone Encounter (Signed)
Left message for pt to call back.   I need to check her status. Is she worse, better? When does she think she can return to work?

## 2015-01-07 NOTE — Telephone Encounter (Signed)
She states she is doing better. 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 SHe has to do a lot of bending twisting and states this may be too soon to go back. Can I extend note until then?

## 2015-01-07 NOTE — Telephone Encounter (Signed)
Per Dr. Cleta Alberts and visit from 01/02/2015. Patient was supposed to rtc yesterday if she was still having problems. Given that she is requesting more time off, she needs to come back for recheck.

## 2015-01-07 NOTE — Telephone Encounter (Signed)
Pt is requesting extended work note for another 2-3 dys,cannot work yet,pt will pick up   [employer] [phone]

## 2015-01-07 NOTE — Telephone Encounter (Signed)
Advised pt to RTC. Pt understood. 

## 2015-01-14 ENCOUNTER — Telehealth: Payer: Self-pay

## 2015-01-14 NOTE — Telephone Encounter (Signed)
Pt needs a extended work note for another week. When she doesn't take meds she still have a lot of problems. Note needs to read no restrictions when she returns to work and husband will pick up on Friday. If its not done by Friday she will come in on Thursday. She would prefer [place] to write however if not done in time she wouldn't mind for anyone to write it.  Please advise [phone]

## 2015-01-14 NOTE — Telephone Encounter (Signed)
Can we write note? 

## 2015-01-14 NOTE — Telephone Encounter (Signed)
Per the documentation from [facility], she is not worsening. As PA-Nicole instructed, I will write out for an additional week but she needs to rtc for re-evaluation if she gets worse or does not have resolution of symptoms. Thank you!

## 2015-01-15 NOTE — Telephone Encounter (Signed)
Pt notified letter ready to pick up. ?

## 2015-01-17 NOTE — Telephone Encounter (Signed)
Received FMLA ppw on tis patient from [employer] on 01/17/2015. Will Place in Nicole's box for completion in 5-7 business days. Pt saw Dorna Leitz, PA-C at 01/07/2015 5:08 PM for Bilateral low back pain without sciatica, and excused from work 01/07/15 - 01/21/15.

## 2015-01-17 NOTE — Telephone Encounter (Signed)
Pt stopped in and was checking on FMLA ppw. I advised her of the previous message and she was already aware that Lanier Clam will be here tomorrow. She states that she will not have a pay check and she has drafts coming out of here accts. She said she needs this done ASAP. Thank you

## 2015-01-18 NOTE — Telephone Encounter (Signed)
Thank you, Please return to disability box, located next to check out at 102, in the future.

## 2015-01-18 NOTE — Telephone Encounter (Signed)
FMLA paperwork filled out and in nurse's box.

## 2015-01-23 NOTE — Progress Notes (Signed)
Subjective:    Patient ID: Alejandra Ray, female    DOB: 22-Feb-1961, 54 y.o.   MRN: [id number] Chief Complaint  Patient presents with  . Annual Exam  . Gynecologic Exam    HPI  This 54 y.o. Cauc female is here for CPE and labs. She was a prior pt of Dr. Angelyn Punt.  Pt has Type II DM, hypothyroidism and lipid disorder.   Sleep disturbance with snoring and fatigue had continued last yr despite some reduction of restless leg symptoms so pt was referred for sleep study last yr. She is now using cpap but then developed sores around her nares treated with vaseline.  Then noticed she was having more arthralgias when she wasn't using it and has resolved since she started using again - the cpap is on the lowest setting poss.  Was seen 3 wks ago for a pulled right low back/abd muscle - was out of work x 3 mos as very active in machines and is now back to work and feeling for the most part better. Used some aleve initially but no longer needing.  Does not check BP outside office - occ fine in [employer] - has never really had any BP prob  Type II DM well controlled: A1c last yr 6.0 -> 6.1 6 mos ago -> 6.1 today.  Last optho appt in Nov 2015, sees Dr. Sherryle Lis and exam.  Is disappointed that she had gained 10 lbs in the past 6 mos but she is still 5 lbs less than she was last yr.  She is following an intense eating plan - 1/2 grapefuit and either 2 eggs or yogurt.  THen during the day she eats very high protein, low/no carb to boost metabolism - does sev wks at a time to reboost.  Has been able to incorporate some of that into her diet - eating a lot more grapefruit and yogurt for breakfast or snacks.  Really wants to get down to 195 lbs and stay under 200 lbs.  HCM: MMG- done 08/30/2014 at [facility] with results nml and scanned into chart           PAP- Oct 2013 - due today if no HPV on prior, all paps were normal. No menses in a year now. Had last pap done here, has not seen gyn in >5 yrs.           CRS-  2010 (recall in 10 years, completely nml). 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 Vit B12, red clover, milk thistle, mvi, and mother's vinegar. Not taking any additional ca/vit D and is taking asa qod.             Had cholecystectomy in 2010  Past Medical History  Diagnosis Date  . Diabetes mellitus   . Hyperlipidemia   . Type 2 diabetes mellitus   . Allergic rhinitis   . SUI (stress urinary incontinence), female    Past Surgical History  Procedure Laterality Date  . Cholecystectomy    . Tublation  2001   Current Outpatient Prescriptions on File Prior to Visit  Medication Sig Dispense Refill  .  aspirin 81 MG tablet Take 81 mg by mouth daily.    . cyclobenzaprine (FLEXERIL) 10 MG tablet TAKE 1 TABLET BY MOUTH AT BEDTIME AS NEEDED 30 tablet 1  . fish oil-omega-3 fatty acids 1000 MG capsule Take 2 g by mouth daily.    Marland Kitchen ipratropium (ATROVENT) 0.03 % nasal spray Place 2 sprays into both nostrils 2 (two) times daily. (Patient taking differently: Place 2 sprays into both nostrils 2 (two) times daily as needed. ) 30 mL 0  . milk thistle 175 MG tablet Take 175 mg by mouth daily.    . Misc Natural Products (RED CLOVER COMBINATION PO) Take 200 mg by mouth daily.    . Multiple Vitamin (MULTIVITAMIN) capsule Take 1 capsule by mouth daily.    . valACYclovir (VALTREX) 1000 MG tablet Take 1 tablet (1,000 mg total) by mouth 2 (two) times daily. 60 tablet 3  . vitamin B-12 (CYANOCOBALAMIN) 500 MCG tablet Take 500 mcg by mouth daily.     No current facility-administered medications on file prior to visit.   Allergies  Allergen Reactions  . Codeine     Blacked out  . Other     PLASTICS  . Sulfa Antibiotics Nausea And Vomiting     Family History  Problem Relation Age of Onset  . Diabetes Mother   . Hyperlipidemia Mother   . Cancer Father   . Diabetes Sister   . Hyperlipidemia Sister    Social History   Social History  . Marital Status: Married    Spouse Name: [name]  . Number of Children: 1  . 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 Smoking status: Former Smoker    Quit date: 05/13/1996  . Smokeless tobacco: Never Used  . Alcohol Use: Yes     Comment: occassionally  . Drug Use: Yes    Special: Marijuana     Comment: occassionally  . Sexual Activity: Yes   Other Topics Concern  . None   Social History Narrative   Patient is married [name]) and lives at home with her husband.   Patient has one adult child.   Patient is right-handed.   Patient does not drink any caffeine.   Married. Education: some college. Exercise: No    Review of Systems  Constitutional: Negative.   HENT: Negative.   Eyes: Negative.   Respiratory: Negative.   Cardiovascular: Negative.   Gastrointestinal: Negative.   Endocrine: Negative.   Genitourinary: Negative.   Musculoskeletal: Negative.   Skin: Negative.   Allergic/Immunologic: Negative.   Neurological: Negative.   Hematological: Negative.   Psychiatric/Behavioral: Negative.       Objective:   Physical Exam  Constitutional: She is oriented to person, place, and time. Vital signs are normal. She appears well-developed and well-nourished. No distress.  HENT:  Head: Normocephalic and atraumatic.  Right Ear: Hearing, tympanic membrane, external ear and ear canal normal.  Left Ear: Hearing, tympanic membrane, external ear and ear canal normal.  Nose: Nose normal. No nasal deformity or septal deviation.  Mouth/Throat: Uvula is midline, oropharynx is clear and moist and mucous membranes are normal. No oral lesions. Normal dentition. No dental caries. No posterior oropharyngeal erythema.  Eyes:  Conjunctivae, EOM and lids are normal. Pupils are equal, round, and reactive to light. No scleral icterus.  Fundoscopic exam:      The right eye shows no papilledema. The right eye shows red reflex.  The left eye shows no papilledema. The left eye shows red reflex.  Neck: Trachea normal, normal range of motion, full passive range of motion without pain and phonation normal. Neck supple. No JVD present. No spinous process tenderness and no muscular tenderness present. Carotid bruit is not present. No thyroid mass and no thyromegaly present.  Cardiovascular: Normal rate, regular rhythm, S1 normal, S2 normal, normal heart sounds, intact distal pulses and normal pulses.   No extrasystoles are present. PMI is not displaced.  Exam reveals no gallop and no friction rub.   No murmur heard. Pulmonary/Chest: Effort normal and breath sounds normal. No respiratory distress. She has no decreased breath sounds. She has no wheezes. Right breast exhibits no inverted nipple, no mass, no nipple discharge, no skin change and no tenderness. Left breast exhibits no inverted nipple, no mass, no nipple discharge, no skin change and no tenderness. Breasts are symmetrical.  Abdominal: Soft. Normal appearance and bowel sounds are normal. She exhibits no distension, no abdominal bruit and no mass. There is no hepatosplenomegaly. There is no tenderness. There is no guarding and no CVA tenderness.  Genitourinary:  Deferred.  Musculoskeletal:       Cervical back: Normal.       Thoracic back: Normal.       Lumbar back: Normal.  Major joints unremarkable; very early degenerative changes w/ good ROM. No deformities, muscle atrophy, c/c/e.  Lymphadenopathy:       Head (right side): No submental, no submandibular, no tonsillar, no preauricular, no posterior auricular and no occipital adenopathy present.       Head (left side): No submental, no submandibular, no tonsillar, no preauricular, no posterior auricular and no occipital  adenopathy present.    She has no cervical adenopathy.    She has no axillary adenopathy.       Right: No supraclavicular adenopathy present.       Left: No supraclavicular adenopathy present.  Neurological: She is alert and oriented to person, place, and time. She has normal strength and normal reflexes. She displays no atrophy. No cranial nerve deficit or sensory deficit. She exhibits normal muscle tone. She displays a negative Romberg sign. Coordination and gait normal.  Skin: Skin is warm, dry and intact. Lesion noted. No ecchymosis and no rash noted. She is not diaphoretic. No cyanosis or erythema. No pallor. Nails show no clubbing.  Skin tags around neck and in axillae.  Psychiatric: She has a normal mood and affect. Her speech is normal and behavior is normal. Judgment and thought content normal. Cognition and memory are normal.  Nursing note and vitals reviewed.  BP 134/78 mmHg  Pulse 68  Temp(Src) 98.7 F (37.1 C)  Resp 16  Ht 5\' 6"  (1.676 m)  Wt 215 lb (97.523 kg)  BMI 34.72 kg/m2  LMP 08/31/2012   Results for orders placed or performed in visit on 01/24/15  Comprehensive metabolic panel  Result Value Ref Range   Sodium 138 135 - 146 mmol/L   Potassium 4.5 3.5 - 5.3 mmol/L   Chloride 106 98 - 110 mmol/L   CO2 23 20 - 31 mmol/L   Glucose, Bld 122 (H) 65 - 99 mg/dL   BUN 14 7 - 25 mg/dL   Creat 1.61 0.96 - 0.45 mg/dL   Total Bilirubin 0.6 0.2 - 1.2 mg/dL   Alkaline Phosphatase 41 33 - 130 U/L   AST 21 10 - 35 U/L   ALT 34 (H) 6 - 29 U/L   Total  Protein 7.7 6.1 - 8.1 g/dL   Albumin 4.7 3.6 - 5.1 g/dL   Calcium 9.7 8.6 - 16.1 mg/dL  Lipid panel  Result Value Ref Range   Cholesterol 198 125 - 200 mg/dL   Triglycerides 87 <096 mg/dL   HDL 50 >=04 mg/dL   Total CHOL/HDL Ratio 4.0 <=5.0 Ratio   VLDL 17 <30 mg/dL   LDL Cholesterol 540 (H) <130 mg/dL  TSH  Result Value Ref Range   TSH 2.699 0.350 - 4.500 uIU/mL  Vitamin B12  Result Value Ref Range   Vitamin B-12  >2000 (H) 211 - 911 pg/mL  HIV antibody  Result Value Ref Range   HIV 1&2 Ab, 4th Generation NONREACTIVE NONREACTIVE  Microalbumin / creatinine urine ratio  Result Value Ref Range   Microalb, Ur 0.7 <2.0 mg/dL   Creatinine, Urine 981.1 mg/dL   Microalb Creat Ratio 6.8 0.0 - 30.0 mg/g  POCT glycosylated hemoglobin (Hb A1C)  Result Value Ref Range   Hemoglobin A1C 6.1   POCT urinalysis dipstick  Result Value Ref Range   Color, UA yellow    Clarity, UA clear    Glucose, UA neg    Bilirubin, UA neg    Ketones, UA neg    Spec Grav, UA 1.020    Blood, UA trace    pH, UA 5.0    Protein, UA neg    Urobilinogen, UA 0.2    Nitrite, UA neg    Leukocytes, UA Trace (A) Negative  Pap IG and HPV (high risk) DNA detection  Result Value Ref Range   HPV DNA High Risk Not Detected    Specimen adequacy: SEE NOTE    FINAL DIAGNOSIS: SEE NOTE    Cytotechnologist: SEE NOTE        Assessment & Plan:  Due for tdap 2018, colonscopy 2020. Needs flu shot today but pt declines. Will get at work. See yr optho for DM eye exam Is taking B12 supp which is more than sufficient. nml vit D 3 yrs prior.  Pap last done 02/09/12 so repeated today and normal, next will be due 01/2020, mam 08/30/14 Needs monofilament foot exam at next OV.  tsh nml, refilled levothyroxine. Has been stable for yrs so recheck in 1 yr.   LFT is minimally elevated which corresponds with increasing LDL but will want to recheck at lab-only visit in 2 mos and the fasting labs and lfts at next OV in 6 mos.  1. Annual physical exam   2. OSA on CPAP   3. Type 2 diabetes mellitus with complication   4. Hypothyroidism due to acquired atrophy of thyroid   5. Hyperlipidemia - restart pravastatin as LDL has increases from 100s to 120s. unsure why pt d/c this after being on it for 6 mos in 2013 but then lipids were better ocntroled  6. RLS (restless legs syndrome)   7. Screening for breast cancer   8. Obesity (BMI 30.0-34.9)   9. HSV-2  infection   10. Screening for colorectal cancer   11. Screening for deficiency anemia   12. Screening for cardiovascular, respiratory, and genitourinary diseases   13. Screening for cervical cancer   14. Screening for STD (sexually transmitted disease)   15. Medication monitoring encounter     Orders Placed This Encounter  Procedures  . Comprehensive metabolic panel    Order Specific Question:  Has the patient fasted?    Answer:  Yes  . Lipid panel    Order Specific Question:  Has the patient fasted?    Answer:  Yes  . TSH  . Vitamin B12  . HIV antibody  . Microalbumin / creatinine urine ratio  . Hepatic Function Panel    Standing Status: Future     Number of Occurrences:      Standing Expiration Date: 01/28/2016  . POCT glycosylated hemoglobin (Hb A1C)  . POCT urinalysis dipstick    Meds ordered this encounter  Medications  . levothyroxine (SYNTHROID, LEVOTHROID) 50 MCG tablet    Sig: TAKE 1 TABLET (50 MCG TOTAL) BY MOUTH DAILY.    Dispense:  90 tablet    Refill:  3     Norberto Sorenson, MD MPH

## 2015-01-24 ENCOUNTER — Encounter: Payer: Self-pay | Admitting: Family Medicine

## 2015-01-24 ENCOUNTER — Ambulatory Visit (INDEPENDENT_AMBULATORY_CARE_PROVIDER_SITE_OTHER): Payer: BLUE CROSS/BLUE SHIELD | Admitting: Family Medicine

## 2015-01-24 VITALS — BP 134/78 | HR 68 | Temp 98.7°F | Resp 16 | Ht 66.0 in | Wt 215.0 lb

## 2015-01-24 DIAGNOSIS — Z1211 Encounter for screening for malignant neoplasm of colon: Secondary | ICD-10-CM

## 2015-01-24 DIAGNOSIS — Z1383 Encounter for screening for respiratory disorder NEC: Secondary | ICD-10-CM

## 2015-01-24 DIAGNOSIS — Z1239 Encounter for other screening for malignant neoplasm of breast: Secondary | ICD-10-CM | POA: Diagnosis not present

## 2015-01-24 DIAGNOSIS — Z1389 Encounter for screening for other disorder: Secondary | ICD-10-CM

## 2015-01-24 DIAGNOSIS — E038 Other specified hypothyroidism: Secondary | ICD-10-CM

## 2015-01-24 DIAGNOSIS — Z113 Encounter for screening for infections with a predominantly sexual mode of transmission: Secondary | ICD-10-CM

## 2015-01-24 DIAGNOSIS — G2581 Restless legs syndrome: Secondary | ICD-10-CM | POA: Diagnosis not present

## 2015-01-24 DIAGNOSIS — Z0000 Encounter for general adult medical examination without abnormal findings: Secondary | ICD-10-CM

## 2015-01-24 DIAGNOSIS — Z136 Encounter for screening for cardiovascular disorders: Secondary | ICD-10-CM

## 2015-01-24 DIAGNOSIS — G4733 Obstructive sleep apnea (adult) (pediatric): Secondary | ICD-10-CM | POA: Diagnosis not present

## 2015-01-24 DIAGNOSIS — Z9989 Dependence on other enabling machines and devices: Secondary | ICD-10-CM

## 2015-01-24 DIAGNOSIS — Z124 Encounter for screening for malignant neoplasm of cervix: Secondary | ICD-10-CM

## 2015-01-24 DIAGNOSIS — E118 Type 2 diabetes mellitus with unspecified complications: Secondary | ICD-10-CM | POA: Diagnosis not present

## 2015-01-24 DIAGNOSIS — Z1212 Encounter for screening for malignant neoplasm of rectum: Secondary | ICD-10-CM

## 2015-01-24 DIAGNOSIS — Z5181 Encounter for therapeutic drug level monitoring: Secondary | ICD-10-CM

## 2015-01-24 DIAGNOSIS — E785 Hyperlipidemia, unspecified: Secondary | ICD-10-CM

## 2015-01-24 DIAGNOSIS — E669 Obesity, unspecified: Secondary | ICD-10-CM | POA: Diagnosis not present

## 2015-01-24 DIAGNOSIS — Z130 Encounter for screening for diseases of the blood and blood-forming organs and certain disorders involving the immune mechanism: Secondary | ICD-10-CM | POA: Diagnosis not present

## 2015-01-24 DIAGNOSIS — B009 Herpesviral infection, unspecified: Secondary | ICD-10-CM | POA: Diagnosis not present

## 2015-01-24 DIAGNOSIS — E034 Atrophy of thyroid (acquired): Secondary | ICD-10-CM

## 2015-01-24 LAB — COMPREHENSIVE METABOLIC PANEL
ALK PHOS: 41 U/L (ref 33–130)
ALT: 34 U/L — AB (ref 6–29)
AST: 21 U/L (ref 10–35)
Albumin: 4.7 g/dL (ref 3.6–5.1)
BILIRUBIN TOTAL: 0.6 mg/dL (ref 0.2–1.2)
BUN: 14 mg/dL (ref 7–25)
CO2: 23 mmol/L (ref 20–31)
Calcium: 9.7 mg/dL (ref 8.6–10.4)
Chloride: 106 mmol/L (ref 98–110)
Creat: 0.67 mg/dL (ref 0.50–1.05)
GLUCOSE: 122 mg/dL — AB (ref 65–99)
Potassium: 4.5 mmol/L (ref 3.5–5.3)
Sodium: 138 mmol/L (ref 135–146)
Total Protein: 7.7 g/dL (ref 6.1–8.1)

## 2015-01-24 LAB — VITAMIN B12: Vitamin B-12: >2000 pg/mL — ABNORMAL HIGH (ref 211–911)

## 2015-01-24 LAB — POCT URINALYSIS DIPSTICK
BILIRUBIN UA: NEGATIVE
Glucose, UA: NEGATIVE
Ketones, UA: NEGATIVE
NITRITE UA: NEGATIVE
PH UA: 5
Protein, UA: NEGATIVE
Spec Grav, UA: 1.02
UROBILINOGEN UA: 0.2

## 2015-01-24 LAB — MICROALBUMIN / CREATININE URINE RATIO
Creatinine, Urine: 103.2 mg/dL
Microalb Creat Ratio: 6.8 mg/g (ref 0.0–30.0)
Microalb, Ur: 0.7 mg/dL (ref <2.0)

## 2015-01-24 LAB — LIPID PANEL
CHOL/HDL RATIO: 4 ratio (ref <=5.0)
Cholesterol: 198 mg/dL (ref 125–200)
HDL: 50 mg/dL (ref >=46)
LDL Cholesterol: 131 mg/dL — ABNORMAL HIGH (ref <130)
Triglycerides: 87 mg/dL (ref <150)
VLDL: 17 mg/dL (ref <30)

## 2015-01-24 LAB — TSH: TSH: 2.699 u[IU]/mL (ref 0.350–4.500)

## 2015-01-24 LAB — POCT GLYCOSYLATED HEMOGLOBIN (HGB A1C): HEMOGLOBIN A1C: 6.1

## 2015-01-24 LAB — HIV ANTIBODY (ROUTINE TESTING W REFLEX): HIV: NONREACTIVE

## 2015-01-24 NOTE — Patient Instructions (Signed)

## 2015-01-25 ENCOUNTER — Encounter: Payer: BLUE CROSS/BLUE SHIELD | Admitting: Family Medicine

## 2015-01-26 LAB — PAP IG AND HPV HIGH-RISK: HPV DNA High Risk: NOT DETECTED

## 2015-01-28 ENCOUNTER — Encounter: Payer: Self-pay | Admitting: Family Medicine

## 2015-01-28 ENCOUNTER — Other Ambulatory Visit: Payer: Self-pay

## 2015-01-28 MED ORDER — PRAVASTATIN SODIUM 20 MG PO TABS: 20.0000 mg | ORAL_TABLET | Freq: Every day | ORAL | Status: DC

## 2015-01-28 MED ORDER — LEVOTHYROXINE SODIUM 50 MCG PO TABS: ORAL_TABLET | ORAL | Status: DC

## 2015-02-01 ENCOUNTER — Telehealth: Payer: Self-pay

## 2015-02-01 NOTE — Telephone Encounter (Signed)
[place] insurance called into FMLA/Disability voicemail on 02/01/15 at 1:30pm stating they have not gotten any records for this patient, I did see that there was a release completed on  01/30/15 so I was just wanting to make sure that this was the information that they were needing. Can you check and let me know. Thanks!

## 2015-02-06 NOTE — Telephone Encounter (Signed)
What doctor was this blank paperwork given to? I see a release that is on hold but there is no note in the computer about who is filling this out?

## 2015-02-07 NOTE — Telephone Encounter (Signed)
Forms completed, signed, and faxed.

## 2015-02-07 NOTE — Telephone Encounter (Signed)
Thank you, you're awesome!!

## 2015-02-09 ENCOUNTER — Other Ambulatory Visit: Payer: Self-pay | Admitting: Physician Assistant

## 2015-02-19 ENCOUNTER — Telehealth: Payer: Self-pay

## 2015-02-19 NOTE — Telephone Encounter (Signed)
Pt came by. Claims Rosann Auerbach has requested disability paperwork and records from Aug 20-Sept 12 and has not received them.I do not see any pending requests for her with FMLA/DIS. I copied the forms she needs from [facility] (Case # [id number]) and she signed a release.  All info in referrals box at [facility]. I am not charging for this, please complete 10/12 if possible and contact patient when done.  Thanks, Raynelle Fanning

## 2015-02-22 ENCOUNTER — Telehealth: Payer: Self-pay | Admitting: Family Medicine

## 2015-02-22 NOTE — Telephone Encounter (Signed)
Per Jasmine/DIS, this has been completed.

## 2015-02-22 NOTE — Telephone Encounter (Signed)
Dr. Clelia CroftShaw,   This patient [facility] and [facility] need this form done as well. I believe this form has been bounced around several different times by us, the patient, and the insurance company. Is it possible to have this completed over the weekend and returned to the FMLA/Disabilities department on Monday? Please let me know if you have any questions about this.   Thanks, [employer] WholesaleJasmine

## 2015-02-23 NOTE — Telephone Encounter (Signed)
I saw pt for her full physical and there were no restrictions to work nor did we discuss any fmla. i have no idea in what this is in regards to so unable complete as have no info. Returned to [employer] box.

## 2015-02-27 NOTE — Telephone Encounter (Signed)
Ok thank you. It turns out Lanier ClamNicole Bush, PA-C had already completed this form and it was faxed sometime last month.

## 2015-05-22 ENCOUNTER — Other Ambulatory Visit: Payer: Self-pay

## 2015-05-22 MED ORDER — VALACYCLOVIR HCL 1 G PO TABS: 1000.0000 mg | ORAL_TABLET | Freq: Two times a day (BID) | ORAL | Status: DC

## 2015-06-12 ENCOUNTER — Encounter: Payer: Self-pay | Admitting: Adult Health

## 2015-06-12 ENCOUNTER — Ambulatory Visit (INDEPENDENT_AMBULATORY_CARE_PROVIDER_SITE_OTHER): Payer: Managed Care, Other (non HMO) | Admitting: Adult Health

## 2015-06-12 VITALS — BP 114/70 | HR 74 | Ht 66.0 in | Wt 226.0 lb

## 2015-06-12 DIAGNOSIS — G4733 Obstructive sleep apnea (adult) (pediatric): Secondary | ICD-10-CM

## 2015-06-12 DIAGNOSIS — Z9989 Dependence on other enabling machines and devices: Secondary | ICD-10-CM

## 2015-06-12 NOTE — Patient Instructions (Signed)
Continue using CPAP nightly If your symptoms worsen or you develop new symptoms please let us know.   

## 2015-06-12 NOTE — Progress Notes (Signed)
I agree with the assessment and plan as directed by NP .The patient is known to me .   Ellyanna Holton, MD  

## 2015-06-12 NOTE — Progress Notes (Signed)
PATIENT: Alejandra Ray DOB: 12/05/1960  REASON FOR VISIT: follow up- OSA on CPAP HISTORY FROM: patient  HISTORY OF PRESENT ILLNESS: Mrs. Alejandra Ray is a 55 year old female with a history of obstructive sleep apnea on CPAP. She returns today for a compliance download. The patient's download indicates that she uses her machine 30 out of 30 days for compliance of 100%. She uses her machine greater than 4 hours 30 out of 30 days for a compliance of 100%. On average she uses her machine 7 hours and 46 minutes. Her residual AHI is 2.1. The patient does not have a significant leak. Her pressure is set between 5-12 cmH20 with EPR of 2. The patient's Epworth sleepiness score is 7 and fatigue severity score is 25. The patient denies any new neurological symptoms. She states that she is sleeping very well with the CPAP. She returns today for an evaluation.  HISTORY 06/18/14 [facility]): Alejandra Ray is a 55 y.o. female , seen here as a revisit from Dr. Audria Nine for a sleep apnea, Compliance to CPAP.  Interval history the patient is here today after her sleep study which took place on 03-09-14 shortly after our initial consultation. She had at the time endorsed the Epworth score at 16 and the fatigue severity score at 43. The patient also endorsed the PHQ9 at 11 points. Her sleep study revealed an AHI of 14.9 which is considered mild apnea and an RDI of 16.6 also in the mild range. When she slept on her back her AHI which consists of apneas and hypotony as meaning cessation of breathing and shallow breathing spells per hour of sleep, increased to 35.6 there was a clear positional component to her apnea. She had frequent periodic limb movements but these did not lead to arousals at least not a significant degree.  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 And today I'm able to look at the download from her machine. The machine is set between a minimum pressure of 5 and maximum pressure of 12 cm water with an EPR of 2 cm.  She's using the machine nightly for 5 hours 11 minutes on average, her compliance is 97% for the last 30 days and 83% for over 4 hours of nightly use.  Her AHI is 2.3. The needs to be no adjustments made also one could argue to set the machine at 9 cm pressure which is close to her 95th percentile need.  The patient reports that she sleeps better more restful and sound when using CPAP - but she seems to have an allergy to some of the plastic parts , especially with the interface touches her nostrils. She has noticed this plastic allergy in the past with other implement such as bijoux.   I am not clear about the allergy being against silica or latex. She no longer feels sleepy or fatigued, sleeps on average only 5-6 hours but sound!   REVIEW OF SYSTEMS: Out of a complete 14 system review of symptoms, the patient complains only of the following symptoms, and all other reviewed systems are negative.  See history of present illness  ALLERGIES: Allergies  Allergen Reactions  . Codeine     Blacked out  . Other     PLASTICS  . Sulfa Antibiotics Nausea And Vomiting    HOME MEDICATIONS: Outpatient Prescriptions Prior to Visit  Medication Sig Dispense Refill  . aspirin 81 MG tablet Take 81 mg by mouth daily.    . cyclobenzaprine (FLEXERIL) 10 MG tablet TAKE 1 TABLET BY MOUTH AT BEDTIME AS NEEDED 30 tablet 1  . fish oil-omega-3 fatty acids 1000 MG capsule Take 2 g by mouth daily.    Marland Kitchen ipratropium (ATROVENT) 0.03 % nasal spray Place 2 sprays into both nostrils 2 (two) times daily. (Patient taking differently: Place 2 sprays into both nostrils 2 (two) times daily as needed. ) 30 mL 0  . levothyroxine (SYNTHROID, LEVOTHROID) 50 MCG tablet TAKE 1 TABLET (50 MCG TOTAL) BY MOUTH DAILY. 90 tablet 3  . milk thistle 175 MG  tablet Take 175 mg by mouth daily.    . Misc Natural Products (RED CLOVER COMBINATION PO) Take 200 mg by mouth daily.    . Multiple Vitamin (MULTIVITAMIN) capsule Take 1 capsule by mouth daily.    . pravastatin (PRAVACHOL) 20 MG tablet Take 1 tablet (20 mg total) by mouth daily. 90 tablet 0  . valACYclovir (VALTREX) 1000 MG tablet Take 1 tablet (1,000 mg total) by mouth 2 (two) times daily. 180 tablet 2  . vitamin B-12 (CYANOCOBALAMIN) 500 MCG tablet Take 500 mcg by mouth daily.     No facility-administered medications prior to visit.    PAST MEDICAL HISTORY: Past Medical History  Diagnosis Date  . Diabetes mellitus   . Hyperlipidemia   . Type 2 diabetes mellitus (HCC)   . Allergic rhinitis   . SUI (stress urinary incontinence), female     PAST SURGICAL HISTORY: Past Surgical History  Procedure Laterality Date  . Cholecystectomy    . Tublation  2001    FAMILY HISTORY: Family History  Problem Relation Age of Onset  . Diabetes Mother   . Hyperlipidemia Mother   . Cancer Father   . Diabetes Sister   . Hyperlipidemia Sister     SOCIAL HISTORY: Social History   Social History  . Marital Status: Married    Spouse Name: Alejandra Ray  . Number of Children: 1  . 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 Smoking status: Former Smoker    Quit date: 05/13/1996  . Smokeless tobacco: Never Used  . Alcohol Use: Yes     Comment: occassionally  . Drug Use: Yes    Special: Marijuana     Comment: occassionally  . Sexual Activity: Yes   Other Topics Concern  . Not on file   Social History Narrative   Patient is married Alejandra Ray) and lives at home with her husband.   Patient has one adult child.   Patient is right-handed.   Patient does not drink any caffeine.   Married. Education: some college. Exercise: No      PHYSICAL EXAM  Filed Vitals:   06/12/15 1518  BP: 114/70  Pulse: 74  Height:  (1.676 m)    Weight: 226 lb (102.513 kg)   Body mass index is 36.49 kg/(m^2).  Generalized: Well developed, in no acute distress    Neurological examination  Mentation: Alert oriented to time, place, history taking. Follows all commands speech and language fluent Cranial nerve II-XII: Pupils were equal round reactive to light. Extraocular movements were full, visual field were full on confrontational test. Facial sensation and strength were normal. Uvula tongue midline. Head turning and shoulder shrug  were normal and symmetric. Motor: The motor testing reveals 5 over 5 strength  of all 4 extremities. Good symmetric motor tone is noted throughout.  Sensory: Sensory testing is intact to soft touch on all 4 extremities. No evidence of extinction is noted.  Coordination: Cerebellar testing reveals good finger-nose-finger and heel-to-shin bilaterally.  Gait and station: Gait is normal. Tandem gait is normal. Romberg is negative. No drift is seen.  Reflexes: Deep tendon reflexes are symmetric and normal bilaterally.   DIAGNOSTIC DATA (LABS, IMAGING, TESTING) - I reviewed patient records, labs, notes, testing and imaging myself where available.  Lab Results  Component Value Date   WBC 6.6 12/31/2014   HGB 13.7 12/31/2014   HCT 41.7 12/31/2014   MCV 86.6 12/31/2014   PLT 275 02/29/2012      Component Value Date/Time   NA 138 01/24/2015 0854   K 4.5 01/24/2015 0854   CL 106 01/24/2015 0854   CO2 23 01/24/2015 0854   GLUCOSE 122* 01/24/2015 0854   BUN 14 01/24/2015 0854   CREATININE 0.67 01/24/2015 0854   CREATININE 0.59 02/29/2012 0925   CALCIUM 9.7 01/24/2015 0854   PROT 7.7 01/24/2015 0854   ALBUMIN 4.7 01/24/2015 0854   AST 21 01/24/2015 0854   ALT 34* 01/24/2015 0854   ALKPHOS 41 01/24/2015 0854   BILITOT 0.6 01/24/2015 0854   GFRNONAA >89 01/24/2014 1042   GFRNONAA >90 02/29/2012 0925   GFRAA >89 01/24/2014 1042   GFRAA >90 02/29/2012 0925   Lab Results  Component Value Date    CHOL 198 01/24/2015   HDL 50 01/24/2015   LDLCALC 131* 01/24/2015   LDLDIRECT 105* 03/24/2012   TRIG 87 01/24/2015   CHOLHDL 4.0 01/24/2015   Lab Results  Component Value Date   HGBA1C 6.1 01/24/2015   Lab Results  Component Value Date   VITAMINB12 >2000* 01/24/2015   Lab Results  Component Value Date   TSH 2.699 01/24/2015      ASSESSMENT AND PLAN 55 y.o. year old female  has a past medical history of Diabetes mellitus; Hyperlipidemia; Type 2 diabetes mellitus (HCC); Allergic rhinitis; and SUI (stress urinary incontinence), female. here with:  1. OSA on CPAP  The patient is doing well. Her compliance download is excellent. She will continue using her CPAP nightly. I have sent a prescription to refill her supplies. Patient advised that if her symptoms worsen or she develops any new symptoms she she'll let us know. She will follow-up in one year or sooner if needed.    Butch Penny, MSN, NP-C 06/12/2015, 3:33 PM [facility] [address], [address] [place], [place] [address] [phone]

## 2015-06-19 ENCOUNTER — Ambulatory Visit: Payer: BLUE CROSS/BLUE SHIELD | Admitting: Adult Health

## 2015-07-18 ENCOUNTER — Ambulatory Visit: Payer: BLUE CROSS/BLUE SHIELD | Admitting: Family Medicine

## 2015-07-29 ENCOUNTER — Other Ambulatory Visit: Payer: Self-pay

## 2015-07-29 DIAGNOSIS — M545 Low back pain, unspecified: Secondary | ICD-10-CM

## 2015-07-29 MED ORDER — LEVOTHYROXINE SODIUM 50 MCG PO TABS: ORAL_TABLET | ORAL | Status: DC

## 2015-07-29 MED ORDER — CYCLOBENZAPRINE HCL 10 MG PO TABS: ORAL_TABLET | ORAL | Status: DC

## 2015-08-22 ENCOUNTER — Encounter: Payer: Self-pay | Admitting: Family Medicine

## 2015-08-22 ENCOUNTER — Ambulatory Visit (INDEPENDENT_AMBULATORY_CARE_PROVIDER_SITE_OTHER): Payer: Managed Care, Other (non HMO) | Admitting: Family Medicine

## 2015-08-22 VITALS — BP 130/70 | HR 97 | Temp 98.4°F | Resp 16 | Ht 66.0 in | Wt 225.0 lb

## 2015-08-22 DIAGNOSIS — E785 Hyperlipidemia, unspecified: Secondary | ICD-10-CM

## 2015-08-22 DIAGNOSIS — G4733 Obstructive sleep apnea (adult) (pediatric): Secondary | ICD-10-CM | POA: Diagnosis not present

## 2015-08-22 DIAGNOSIS — E034 Atrophy of thyroid (acquired): Secondary | ICD-10-CM | POA: Diagnosis not present

## 2015-08-22 DIAGNOSIS — Z5181 Encounter for therapeutic drug level monitoring: Secondary | ICD-10-CM | POA: Diagnosis not present

## 2015-08-22 DIAGNOSIS — E119 Type 2 diabetes mellitus without complications: Secondary | ICD-10-CM

## 2015-08-22 DIAGNOSIS — E669 Obesity, unspecified: Secondary | ICD-10-CM | POA: Diagnosis not present

## 2015-08-22 DIAGNOSIS — E038 Other specified hypothyroidism: Secondary | ICD-10-CM | POA: Diagnosis not present

## 2015-08-22 DIAGNOSIS — E66811 Obesity, class 1: Secondary | ICD-10-CM

## 2015-08-22 DIAGNOSIS — Z9989 Dependence on other enabling machines and devices: Secondary | ICD-10-CM

## 2015-08-22 LAB — POCT GLYCOSYLATED HEMOGLOBIN (HGB A1C): Hemoglobin A1C: 6.4

## 2015-08-22 MED ORDER — FLUTICASONE PROPIONATE 50 MCG/ACT NA SUSP: 2.0000 | Freq: Every day | NASAL | Status: DC

## 2015-08-22 MED ORDER — VALACYCLOVIR HCL 1 G PO TABS: 1000.0000 mg | ORAL_TABLET | Freq: Two times a day (BID) | ORAL | Status: DC

## 2015-08-22 NOTE — Progress Notes (Signed)
Subjective:    Patient ID: Alejandra Ray, female    DOB: [birth year], 55 y.o.   MRN: [id number]  Chief Complaint  Patient presents with  . Follow-up    DIABETES    HPI  Alejandra Ray is a delightful 55 yo woman here today for a 6 month follow-up on her chronic medical conditions.  Chronic medical conditions: T2DM well-controlled: 6.0 -> 6.1 -> 6.1-> 6.4.  Optho: [facility], last appt 05/2015. Microalb normal 01/2015.  Pt spent the week with her grandson and so did not eat as much Obesity: Intense eating plan with very high protein, low/no carb several times/wk to boost metabolism.  Goal to get down to 195 lbs and stay under 200.  Is back on the treadmill - [place] HPL: LDL not at goal 6 mos ago at 131. Pt was on pravastatin prior so restarted  6 mos prior - didn't remember why it was d/c'd after initial trial in 2013 but restarted 6 mos prior.  Tolerating it fine. Still on high dose fish oil OSA/RLS: On cpap sine 2015 with high compliance Hypothyroid: tsh at goal 6 mos ago - has been stable for years on levothyroxine 50   Past Medical History  Diagnosis Date  . Diabetes mellitus   . Hyperlipidemia   . Type 2 diabetes mellitus (HCC)   . Allergic rhinitis   . SUI (stress urinary incontinence), female    Past Surgical History  Procedure Laterality Date  . Cholecystectomy    . Tublation  2001   Current Outpatient Prescriptions on File Prior to Visit  Medication Sig Dispense Refill  . aspirin 81 MG tablet Take 81 mg by mouth daily.    . cyclobenzaprine (FLEXERIL) 10 MG tablet TAKE 1 TABLET BY MOUTH AT BEDTIME AS NEEDED 90 tablet 0  . fish oil-omega-3 fatty acids 1000 MG capsule Take 2 g by mouth daily.    Marland Kitchen levothyroxine (SYNTHROID, LEVOTHROID) 50 MCG tablet TAKE 1 TABLET (50 MCG TOTAL) BY MOUTH DAILY. 90 tablet 1  . milk thistle 175 MG tablet Take 175 mg by mouth daily.    . Misc Natural Products (RED CLOVER COMBINATION PO) Take 200 mg by mouth daily.      . Multiple Vitamin (MULTIVITAMIN) capsule Take 1 capsule by mouth daily.    . pravastatin (PRAVACHOL) 20 MG tablet Take 1 tablet (20 mg total) by mouth daily. 90 tablet 0  . vitamin B-12 (CYANOCOBALAMIN) 500 MCG tablet Take 500 mcg by mouth daily.     No current facility-administered medications on file prior to visit.   Allergies  Allergen Reactions  . Codeine     Blacked out  . Other     PLASTICS  . Sulfa Antibiotics Nausea And Vomiting   Family History  Problem Relation Age of Onset  . Diabetes Mother   . Hyperlipidemia Mother   . Cancer Father   . Diabetes Sister   . Hyperlipidemia Sister    Social History   Social History  . Marital Status: Married    Spouse Name: Alejandra Ray  . Number of Children: 1  . 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 Smoking status: Former Smoker    Quit date: 05/13/1996  . Smokeless tobacco: Never Used  . Alcohol Use: Yes     Comment: occassionally  . Drug Use: Yes    Special: Marijuana  Comment: occassionally  . Sexual Activity: Yes   Other Topics Concern  . None   Social History Narrative   Patient is married Alejandra Ray(Alejandra Ray) and lives at home with her husband.   Patient has one adult child.   Patient is right-handed.   Patient does not drink any caffeine.   Married. Education: some college. Exercise: No     Review of Systems  Constitutional: Positive for activity change (increase), appetite change (decrease) and fatigue. Negative for fever, chills and diaphoresis.  Eyes: Negative for visual disturbance.  Respiratory: Negative for cough and shortness of breath.   Cardiovascular: Negative for chest pain, palpitations and leg swelling.  Gastrointestinal: Negative for nausea, vomiting, abdominal pain and abdominal distention.  Genitourinary: Negative for decreased urine volume.  Musculoskeletal: Positive for arthralgias. Negative for myalgias.  Neurological: Negative  for syncope, numbness and headaches.  Hematological: Does not bruise/bleed easily.  Psychiatric/Behavioral: Negative for sleep disturbance.       Objective:  BP 130/70 mmHg  Pulse 97  Temp(Src) 98.4 F (36.9 C) (Oral)  Resp 16  Ht 5\' 6"  (1.676 m)  Wt 225 lb (102.059 kg)  BMI 36.33 kg/m2  SpO2 98%  LMP 08/31/2012  Physical Exam  Constitutional: She is oriented to person, place, and time. She appears well-developed and well-nourished. No distress.  HENT:  Head: Normocephalic and atraumatic.  Right Ear: External ear normal.  Left Ear: External ear normal.  Eyes: Conjunctivae are normal. No scleral icterus.  Neck: Normal range of motion. Neck supple. No thyromegaly present.  Cardiovascular: Normal rate, regular rhythm, normal heart sounds and intact distal pulses.   Pulmonary/Chest: Effort normal and breath sounds normal. No respiratory distress.  Musculoskeletal: She exhibits no edema.  Lymphadenopathy:    She has no cervical adenopathy.  Neurological: She is alert and oriented to person, place, and time.  Skin: Skin is warm and dry. She is not diaphoretic. No erythema.  Psychiatric: She has a normal mood and affect. Her behavior is normal.        Assessment & Plan:  Needs foot exam  Valtrex as needed   1. Type 2 diabetes mellitus without complication, without long-term current use of insulin (HCC)   2. Hypothyroidism due to acquired atrophy of thyroid - tsh nml, cont levothyroxine at same dose  3. Hyperlipidemia - goal <100 - LDL above goal at 124 so increase pravastatin from 20 to 40.  4. OSA on CPAP   5. Obesity (BMI 30.0-34.9)   6. Medication monitoring encounter     Orders Placed This Encounter  Procedures  . Comprehensive metabolic panel    Order Specific Question:  Has the patient fasted?    Answer:  Yes  . Lipid panel    Order Specific Question:  Has the patient fasted?    Answer:  Yes  . TSH  . POCT glycosylated hemoglobin (Hb A1C)    Meds ordered  this encounter  Medications  . valACYclovir (VALTREX) 1000 MG tablet    Sig: Take 1 tablet (1,000 mg total) by mouth 2 (two) times daily.    Dispense:  10 tablet    Refill:  11  . fluticasone (FLONASE) 50 MCG/ACT nasal spray    Sig: Place 2 sprays into both nostrils at bedtime.    Dispense:  16 g    Refill:  5     Norberto SorensonEva Shaw, MD MPH

## 2015-08-22 NOTE — Patient Instructions (Addendum)
   IF you received an x-ray today, you will receive an invoice from [facility]. Please contact [facility] at [phone] with questions or concerns regarding your invoice.   IF you received labwork today, you will receive an invoice from [facility]. Please contact [facility] at [phone] with questions or concerns regarding your invoice.   Our billing staff will not be able to assist you with questions regarding bills from these companies.  You will be contacted with the lab results as soon as they are available. The fastest way to get your results is to activate your My Chart account. Instructions are located on the last page of this paperwork. If you have not heard from us regarding the results in 2 weeks, please contact this office.    Diet for Metabolic Syndrome Metabolic syndrome is a disorder that includes at least three of these conditions:  Abdominal obesity.  Too much sugar in your blood.  High blood pressure.  Higher than normal amount of fat (lipids) in your blood.  Lower than normal level of "good" cholesterol (HDL). Following a healthy diet can help to keep metabolic syndrome under control. It can also help to prevent the development of conditions that are associated with metabolic syndrome, such as diabetes, heart disease, and stroke. Along with exercise, a healthy diet:  Helps to improve the way that the body uses insulin.  Promotes weight loss. A common goal for people with this condition is to lose at least 7 to 10 percent of their starting weight. WHAT DO I NEED TO KNOW ABOUT THIS DIET?  Use the glycemic index (GI) to plan your meals. The index tells you how quickly a food will raise your blood sugar. Choose foods that have low GI values. These foods take a longer time to raise blood sugar.  Keep track of how many calories you take in. Eating the right amount of calories will help your achieve a healthy  weight.  You may want to follow a Mediterranean diet. This diet includes lots of vegetables, lean meats or fish, whole grains, fruits, and healthy oils and fats. WHAT FOODS CAN I EAT? Grains Stone-ground whole wheat. Pumpernickel bread. Whole-grain bread, crackers, tortillas, cereal, and pasta. Unsweetened oatmeal.Bulgur.Barley.Quinoa.Brown rice or wild rice. Vegetables Lettuce. Spinach. Peas. Beets. Cauliflower. Cabbage. Broccoli. Carrots. Tomatoes. Squash. Eggplant. Herbs. Peppers. Onions. Cucumbers. Brussels sprouts. Sweet potatoes. Yams. Beans. Lentils. Fruits Berries. Apples. Oranges. Grapes. Mango. Pomegranate. Kiwi. Cherries. Meats and Other Protein Sources Seafood and shellfish. Lean meats.Poultry. Tofu. Dairy Low-fat or fat-free dairy products, such as milk, yogurt, and cheese. Beverages Water. Low-fat milk. Milk alternatives, like soy milk or almond milk. Real fruit juice. Condiments Low-sugar or sugar-free ketchup, barbecue sauce, and mayonnaise. Mustard. Relish. Fats and Oils Avocado. Canola or olive oil. Nuts and nut butters.Seeds. The items listed above may not be a complete list of recommended foods or beverages. Contact your dietitian for more options.  WHAT FOODS ARE NOT RECOMMENDED? Red meat. Palm oil and coconut oil. Processed foods. Fried foods. Alcohol. Sweetened drinks, such as iced tea and soda. Sweets. Salty foods. The items listed above may not be a complete list of foods and beverages to avoid. Contact your dietitian for more information.   This information is not intended to replace advice given to you by your health care provider. Make sure you discuss any questions you have with your health care provider.   Document Released: 09/11/2014 Document Reviewed: 09/11/2014 Elsevier Interactive Patient Education 2016 Elsevier Inc.    Metabolic Syndrome Metabolic syndrome is the presence of at least three factors that increase your risk of getting  cardiovascular disease and diabetes. These factors are:  High blood sugar.  High blood triglyceride level.  High blood pressure.  Low levels of good blood cholesterol (high-density lipoprotein or HDL).  Excess weight around the waist. This factor is present with a waist measurement of:  More than 40 inches in men.  More than 35 inches in women. Metabolic syndrome is sometimes called insulin resistance syndrome and syndrome X. CAUSES The exact cause is not known, but genetics and lifestyle choices play a role. RISK FACTORS You are more likely to develop metabolic syndrome if:  You eat a diet high in calories and saturated fat.  You do not exercise regularly.  You are overweight.  You have a family history of metabolic syndrome.  You are Asian.  You are older in age.  You have insulin resistance.  You use any tobacco products, including cigarettes, chewing tobacco, or electronic cigarettes. SIGNS AND SYMPTOMS Metabolic syndrome has no specific symptoms. DIAGNOSIS To make a diagnosis, your health care provider will determine whether you have at least three of the factors that make up metabolic syndrome by:  Taking your blood pressure.  Measuring your waist.  Ordering blood tests. TREATMENT Treatment may include:  Lifestyle changes to reduce your risk for heart disease and stroke, such as:  Exercise.  Weight loss.  Maintaining a healthy diet.  Quitting the use of any tobacco products, including cigarettes, chewing tobacco, or electronic cigarettes.  Medicines that:  Help your body to maintain glucose control.  Reduce your blood pressure and your blood triglyceride levels. HOME CARE INSTRUCTIONS  Exercise regularly.  Maintain a healthy diet.  Do not use any tobacco products, including cigarettes, chewing tobacco, or electronic cigarettes. If you need help quitting, ask your health care provider.  Keep all follow-up visits as directed by your health  care provider. This is important.  Measure your waist regularly and record the measurement. To measure your waist:  Stand up straight.  Breathe out.  Wrap the measuring tape around the part of your waist that is just above your hipbones.  Read the measurement. SEEK MEDICAL CARE IF:  You feel very tired.  You develop excessive thirst.  You pass large quantities of urine.  You put on weight around your waist.  You have headaches over and over again.  You have a dizzy spell. SEEK IMMEDIATE MEDICAL CARE IF:  You develop sudden blurred vision.  You develop a sudden dizzy spell.  You have sudden trouble speaking or swallowing.  You have sudden weakness in your arm or leg.  You have chest pains or trouble breathing.  You feel like your heartbeat is abnormal.  You faint.   This information is not intended to replace advice given to you by your health care provider. Make sure you discuss any questions you have with your health care provider.   Document Released: 08/04/2007 Document Revised: 05/18/2014 Document Reviewed: 12/01/2013 Elsevier Interactive Patient Education 2016 Elsevier Inc.  

## 2015-08-23 LAB — COMPREHENSIVE METABOLIC PANEL
ALBUMIN: 4.8 g/dL (ref 3.6–5.1)
ALT: 46 U/L — ABNORMAL HIGH (ref 6–29)
AST: 26 U/L (ref 10–35)
Alkaline Phosphatase: 45 U/L (ref 33–130)
BILIRUBIN TOTAL: 0.6 mg/dL (ref 0.2–1.2)
BUN: 11 mg/dL (ref 7–25)
CO2: 24 mmol/L (ref 20–31)
CREATININE: 0.68 mg/dL (ref 0.50–1.05)
Calcium: 9.8 mg/dL (ref 8.6–10.4)
Chloride: 102 mmol/L (ref 98–110)
GLUCOSE: 151 mg/dL — AB (ref 65–99)
Potassium: 4.5 mmol/L (ref 3.5–5.3)
SODIUM: 138 mmol/L (ref 135–146)
Total Protein: 7.7 g/dL (ref 6.1–8.1)

## 2015-08-23 LAB — TSH: TSH: 3.53 mIU/L

## 2015-08-23 LAB — LIPID PANEL
Cholesterol: 194 mg/dL (ref 125–200)
HDL: 44 mg/dL — ABNORMAL LOW (ref >=46)
LDL CALC: 124 mg/dL (ref <130)
Total CHOL/HDL Ratio: 4.4 Ratio (ref <=5.0)
Triglycerides: 129 mg/dL (ref <150)
VLDL: 26 mg/dL (ref <30)

## 2015-09-08 ENCOUNTER — Encounter: Payer: Self-pay | Admitting: Family Medicine

## 2015-09-08 MED ORDER — PRAVASTATIN SODIUM 40 MG PO TABS
40.0000 mg | ORAL_TABLET | Freq: Every day | ORAL | Status: DC
Start: 2015-09-08 — End: 2016-12-12

## 2015-10-08 ENCOUNTER — Telehealth: Payer: Self-pay

## 2015-10-08 IMAGING — CR DG SACRUM/COCCYX 2+V
3 series · 3 of 3 positions shown · non-contrast
Comparison: none

[AP]
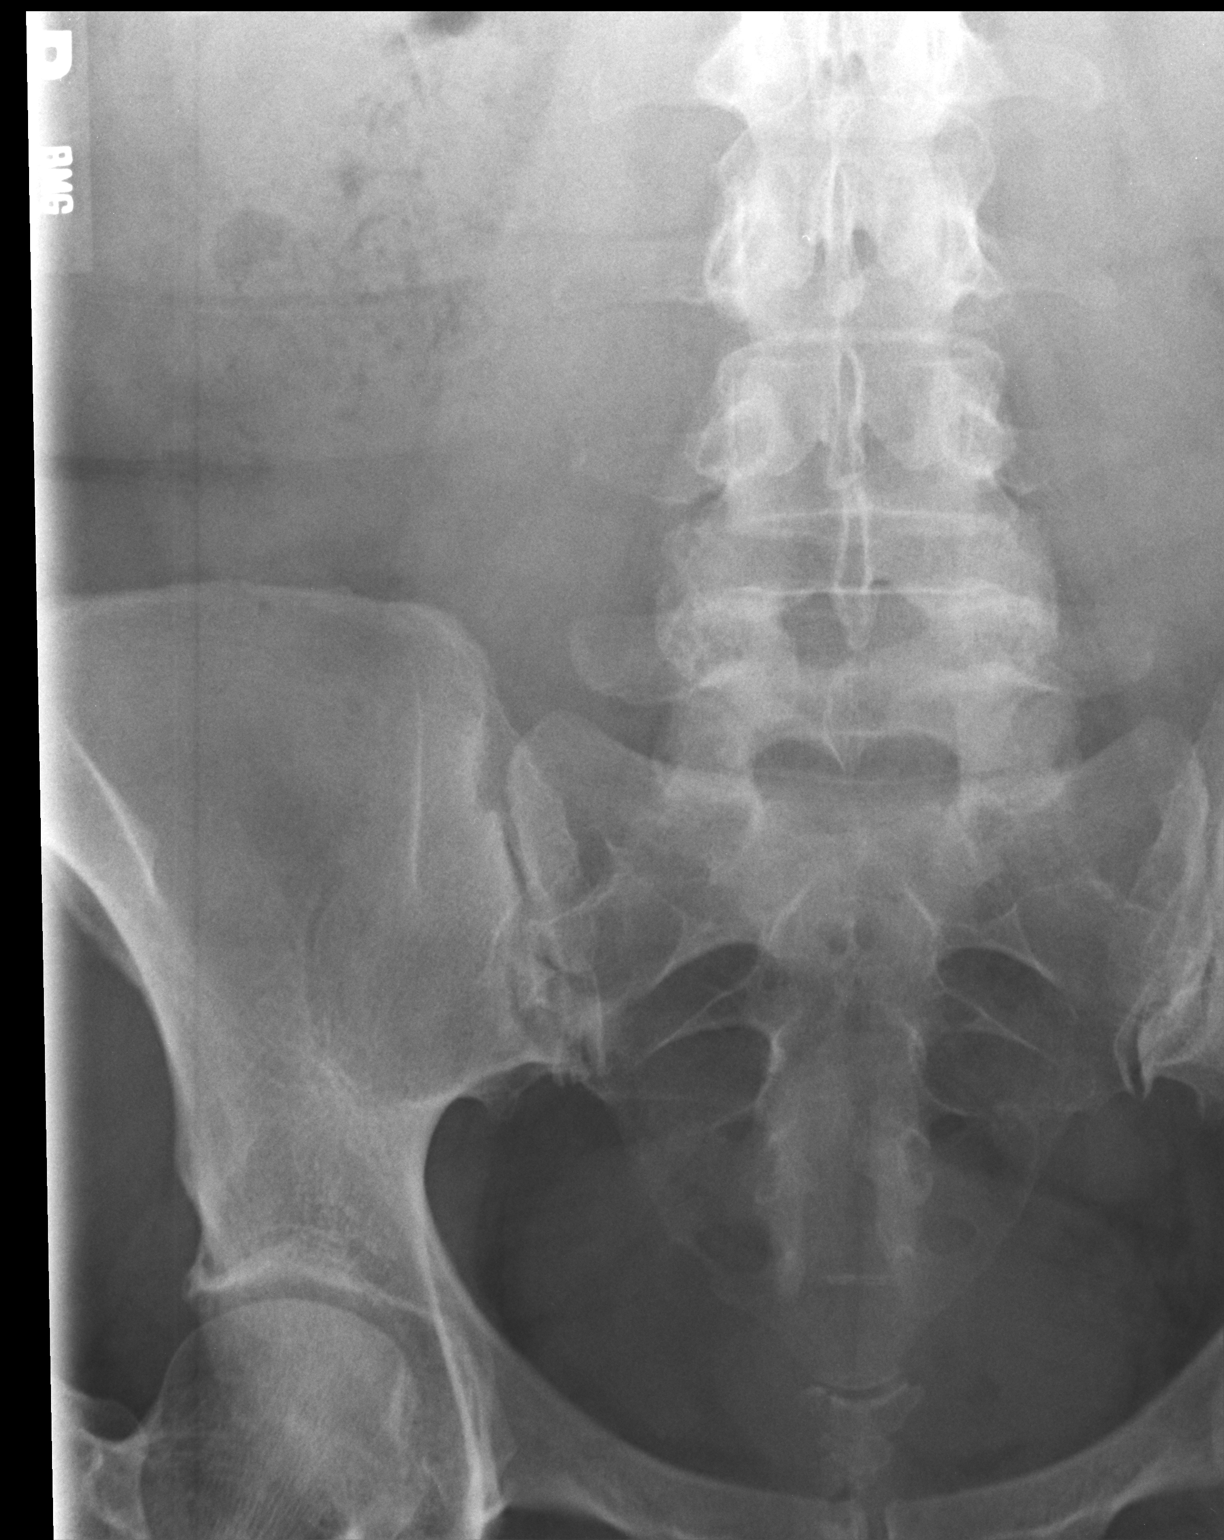

[ap axial]
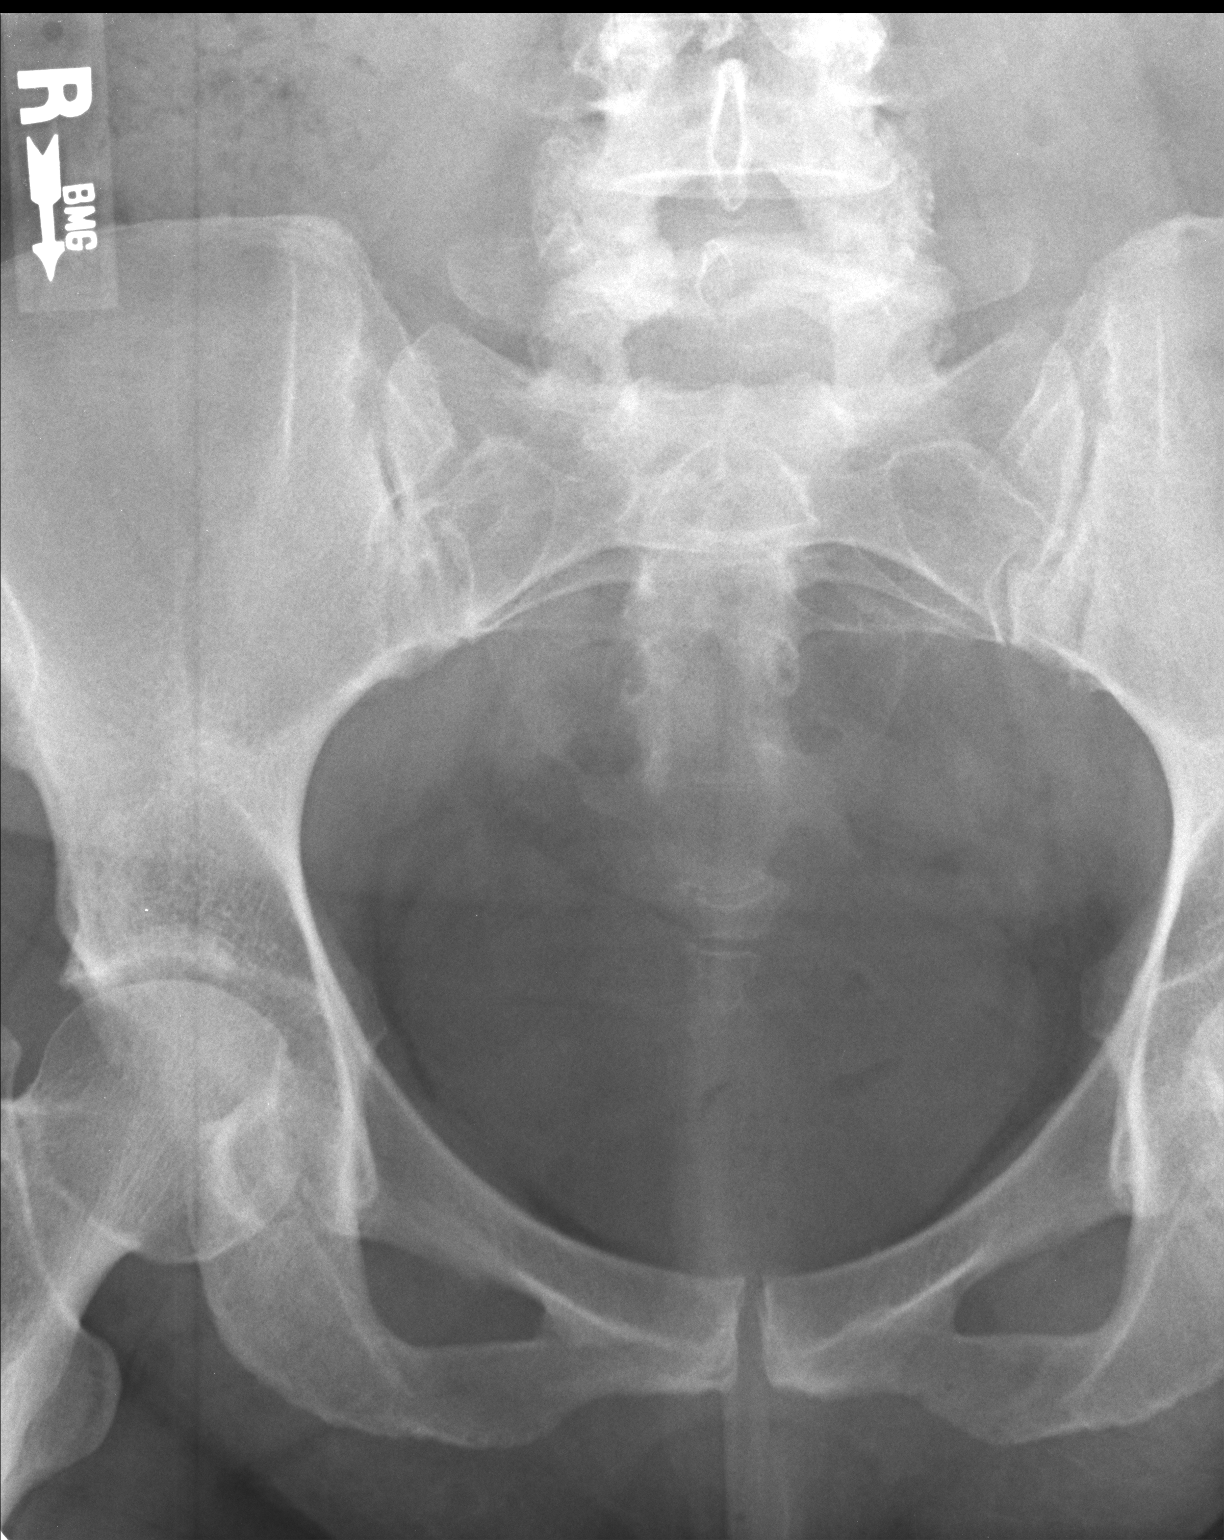

[lateral]
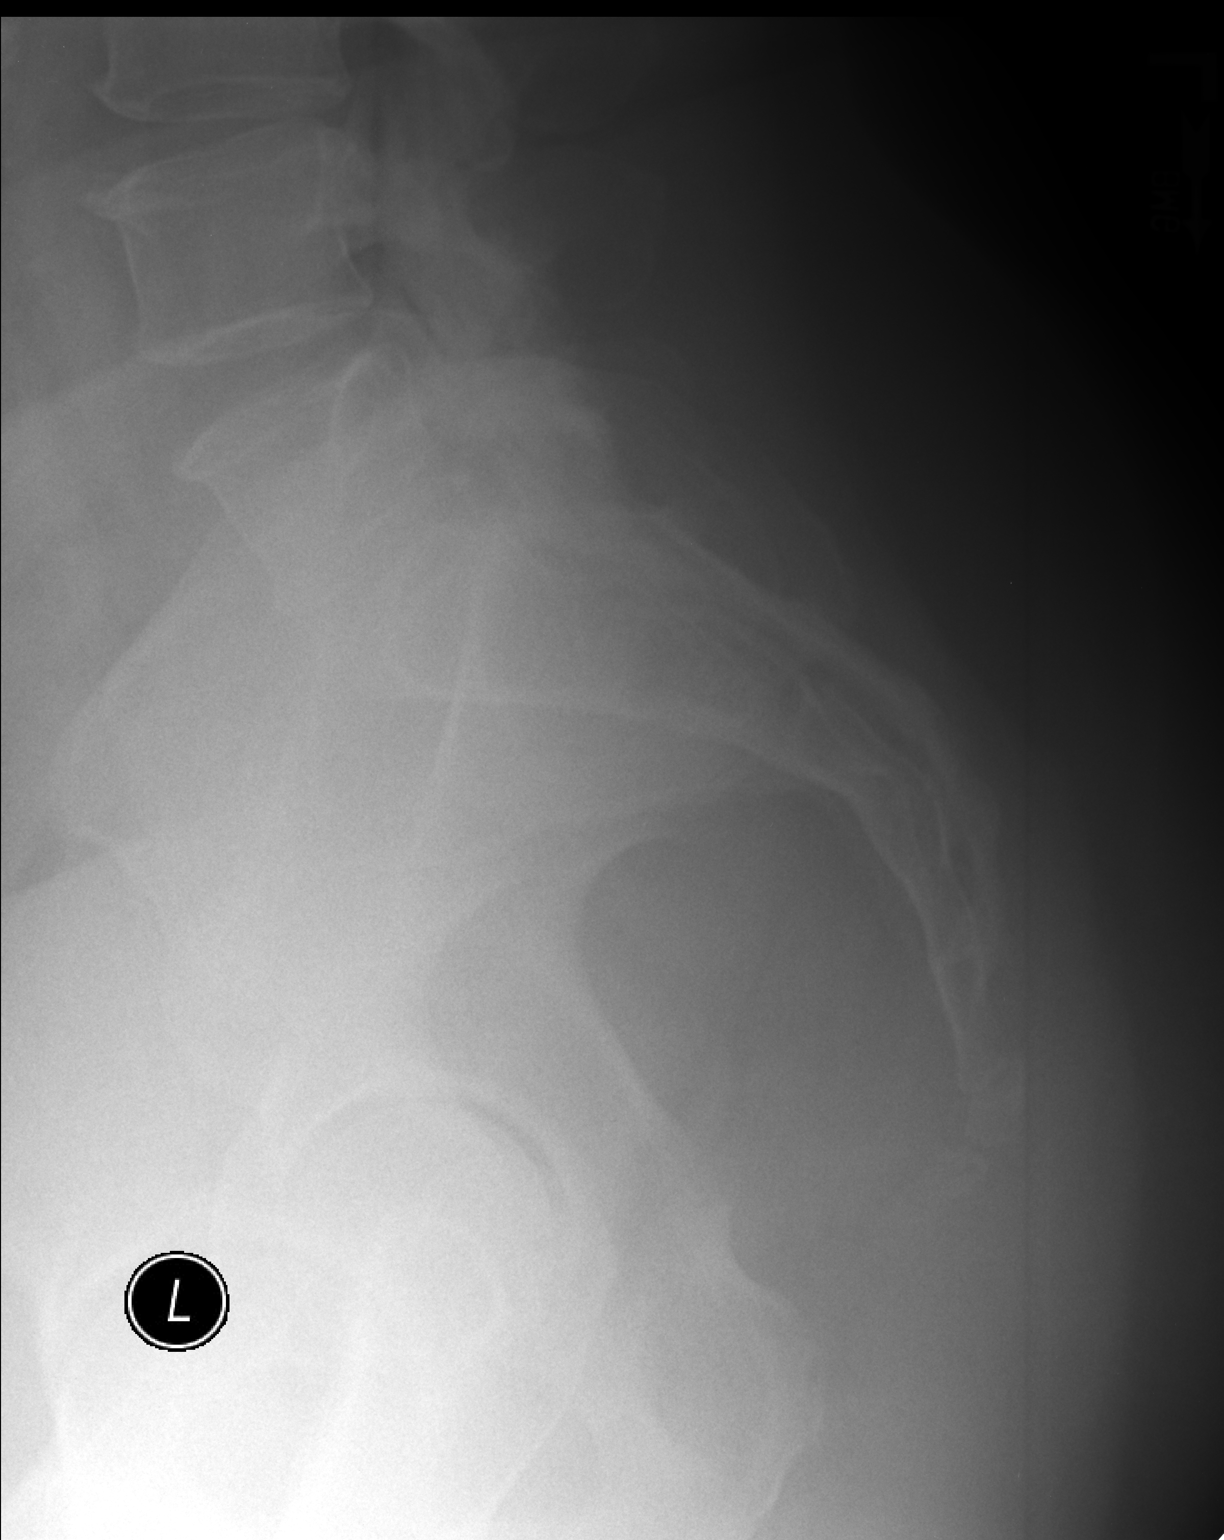

[3 of 3 positions shown; findings below may reference images not displayed]

CLINICAL DATA
Post fall

EXAM
SACRUM AND COCCYX - 2+ VIEW

COMPARISON
None.

FINDINGS
There is minimal deformity involving the tips of the coccyx which
wall possibly normal for the patient, a minimally displaced fracture
may have similar appearance. Regional soft tissues appear normal. No
radiopaque foreign body.

Normal appearance of the bilateral SI joints and pubic symphysis.
Mild DDD within the imaged caudal aspect of the lumbar spine.

IMPRESSION
Possible minimally displaced fracture of the coccyx. Correlate for
point tenderness at this location is recommended.

SIGNATURE

## 2015-10-08 NOTE — Telephone Encounter (Signed)
Msg is for Dr. Clelia CroftShaw. Pt is going on a cruise end of June first week of July and would like to have something to prevent upset stomach. She has saw Dr.Rictor last year about this but he is no longer here.  Please advise

## 2015-10-18 NOTE — Telephone Encounter (Signed)
Dr. Hal Hopeichter hasn't worked here since 2013. . .  Am happy to send in zofran for nausea or a scopolamine patch for motion sickness - which does she need?

## 2015-10-18 NOTE — Telephone Encounter (Signed)
Attempted to call pt, left VM for pt to call back asap  

## 2015-10-21 NOTE — Telephone Encounter (Signed)
Called and spoke with husband who states that patient is currently out of the country but will have her call back in about 2 weeks

## 2015-10-30 MED ORDER — SCOPOLAMINE 1 MG/3DAYS TD PT72: 1.0000 | MEDICATED_PATCH | TRANSDERMAL | Status: DC

## 2015-10-30 NOTE — Telephone Encounter (Signed)
Done - Angie told the patient who came into the clinic - the medication was sent to the pharmacy.

## 2015-12-25 ENCOUNTER — Other Ambulatory Visit: Payer: Self-pay

## 2015-12-25 MED ORDER — LEVOTHYROXINE SODIUM 50 MCG PO TABS: ORAL_TABLET | ORAL | 0 refills | Status: DC

## 2016-02-20 ENCOUNTER — Encounter: Payer: Managed Care, Other (non HMO) | Admitting: Family Medicine

## 2016-03-18 ENCOUNTER — Ambulatory Visit (INDEPENDENT_AMBULATORY_CARE_PROVIDER_SITE_OTHER): Payer: Managed Care, Other (non HMO) | Admitting: Family Medicine

## 2016-03-18 VITALS — BP 120/80 | HR 91 | Temp 98.3°F | Resp 16 | Ht 66.0 in | Wt 220.0 lb

## 2016-03-18 DIAGNOSIS — E119 Type 2 diabetes mellitus without complications: Secondary | ICD-10-CM

## 2016-03-18 DIAGNOSIS — G4733 Obstructive sleep apnea (adult) (pediatric): Secondary | ICD-10-CM | POA: Diagnosis not present

## 2016-03-18 DIAGNOSIS — Z23 Encounter for immunization: Secondary | ICD-10-CM

## 2016-03-18 DIAGNOSIS — E039 Hypothyroidism, unspecified: Secondary | ICD-10-CM

## 2016-03-18 LAB — BASIC METABOLIC PANEL
BUN: 13 mg/dL (ref 7–25)
CALCIUM: 9.3 mg/dL (ref 8.6–10.4)
CHLORIDE: 98 mmol/L (ref 98–110)
CO2: 21 mmol/L (ref 20–31)
CREATININE: 0.66 mg/dL (ref 0.50–1.05)
Glucose, Bld: 358 mg/dL — ABNORMAL HIGH (ref 65–99)
Potassium: 4.5 mmol/L (ref 3.5–5.3)
Sodium: 132 mmol/L — ABNORMAL LOW (ref 135–146)

## 2016-03-18 LAB — MAGNESIUM: MAGNESIUM: 1.9 mg/dL (ref 1.5–2.5)

## 2016-03-18 LAB — TSH: TSH: 3.67 mIU/L

## 2016-03-18 LAB — HEMOGLOBIN A1C
Hgb A1c MFr Bld: 11.8 % — ABNORMAL HIGH (ref <5.7)
MEAN PLASMA GLUCOSE: 292 mg/dL

## 2016-03-18 MED ORDER — LEVOTHYROXINE SODIUM 50 MCG PO TABS: ORAL_TABLET | ORAL | 1 refills | Status: DC

## 2016-03-18 MED ORDER — FLUTICASONE PROPIONATE 50 MCG/ACT NA SUSP: 2.0000 | Freq: Every day | NASAL | 5 refills | Status: DC

## 2016-03-18 NOTE — Progress Notes (Signed)
   Alejandra Ray is a 55 y.o. female who presents to [facility] today for 6 month FU for diabetes:  1.  Diabetes:  Currently diet-controlled.  Diagnosed previously with pre-diabetes.  Previously on Metformin but she has been off this for some time.  No hypoglycemic events.  No paresthesia or peripheral nerve pain.     Lab Results  Component Value Date   HGBA1C 6.4 08/22/2015   2.  Hypothyroidism:  Patient is been on Synthroid for years. She has her TSH checked every 6 months or so. It has not been unusual for the past year and a half. She has had no medication dosage changes. Denies missing any doses. No heat or cold intolerance. No lower extremity edema.  3.  OSA:  On CPAP since 2015 and doing well without complaints.  Sleeping well through the night.  States she definitely notes if she misses this.  4. Cough: Chronic cough since July. Flares when she doesn't take her Flonase. She denies any reflux/indigestion symptoms. She denies any overt sinus congestion or nasal drainage that she knows of. No history of asthma. No wheezing. Cough not worsened night. Occasionally productive of green sputum. Otherwise dry and hacking. No fevers or chills.  ROS as above.   PMH reviewed. Patient is a nonsmoker.   Past Medical History:  Diagnosis Date  . Allergic rhinitis   . Diabetes mellitus   . Hyperlipidemia   . SUI (stress urinary incontinence), female   . Type 2 diabetes mellitus (HCC)    Past Surgical History:  Procedure Laterality Date  . CHOLECYSTECTOMY    . tublation  2001    Medications reviewed.   Physical Exam:  BP 120/80 (BP Location: Right Arm, Patient Position: Sitting, Cuff Size: Normal)   Pulse 91   Temp 98.3 F (36.8 C) (Oral)   Resp 16   Ht 5\' 6"  (1.676 m)   Wt 220 lb (99.8 kg)   LMP 08/31/2012   SpO2 97%   BMI 35.51 kg/m  Gen:  Alert, cooperative patient who appears stated age in no acute distress.  Vital signs reviewed. Head: [place]/AT.   Eyes:   EOMI, PERRL.   Ears:  External ears WNL, Bilateral TM's normal without retraction, redness or bulging. Nose:  Septum midline, with boggy and erythematous turbinates BL. Mouth:  MMM, tonsils non-erythematous, non-edematous.   Neck:  No thyromegaly Pulm:  Clear to auscultation bilaterally with good air movement.  Cardiac:  Regular rate and rhythm   Exts: Non edematous BL  LE, warm and well perfused.   Assessment and Plan:  1.  DM2: - checking A1C today.  Previously diet-controlled. -Changes pending A1c.  #2. Hypothyroidism: -No changes today. TSH pending.  #3. Chronic cough: -Most likely secondary to postnasal drip. -Treating with Flonase adding cetirizine today. - FU if no improvement.   #4. OSA: -Control his sleep apnea. Continue.

## 2016-03-18 NOTE — Patient Instructions (Addendum)
  It was good to see you today.  The generics for the allergy medicines are loratadine for Claritin and cetirizine for Zyrtec. Try one of these for the next couple weeks once a day and let me know how you're doing with your cough.  Refills today for Flonase and levothyroxine.  We're checking your labs and let you know the results.  Try the stinging nettle for allergies about 6 weeks before allergy season/weather changes occur.     IF you received an x-ray today, you will receive an invoice from [facility]. Please contact [facility] at [phone] with questions or concerns regarding your invoice.   IF you received labwork today, you will receive an invoice from [facility]. Please contact [facility] at [phone] with questions or concerns regarding your invoice.   Our billing staff will not be able to assist you with questions regarding bills from these companies.  You will be contacted with the lab results as soon as they are available. The fastest way to get your results is to activate your My Chart account. Instructions are located on the last page of this paperwork. If you have not heard from us regarding the results in 2 weeks, please contact this office.

## 2016-03-19 ENCOUNTER — Telehealth: Payer: Self-pay | Admitting: Family Medicine

## 2016-03-19 MED ORDER — GLIPIZIDE 10 MG PO TABS
ORAL_TABLET | ORAL | 3 refills | Status: DC
Start: 2016-03-19 — End: 2016-03-20

## 2016-03-19 NOTE — Telephone Encounter (Signed)
Called patient and discussed her hyperglycemia and elevated A1c.  She denies any polyuria or polydipsia. No polyphagia. She does state that for the past several month she's had very poor diet she has been traveling to the [place] and eating mostly carbohydrates. Has not been able to have much fruit or vegetables or water secondary to concerns about water overseas.  Plan to start glipizide twice a day today. She needs to be on metformin as well. However she is adamant that metformin actually raises her blood sugars. It was difficult to get into a long conversation about this over the phone and would like to reiterate this when she comes back. She needs to be seen later this month to recheck to ensure her hyperglycemia is improving with better diet, activity, and Glipizide.  She would qualify for starting insulin now -- but she is surprisingly asymptomatic and doesn't want to do any injectables.    She will return later this month.  This is likely cause of the cramps she was complaining of.  Her electrolytes were normal and we did discuss this.

## 2016-03-20 MED ORDER — GLIPIZIDE 10 MG PO TABS: ORAL_TABLET | ORAL | 3 refills | Status: DC

## 2016-03-20 NOTE — Addendum Note (Signed)
Addended by: Sheppard PlumberBRIGGS, Demir Titsworth A on: 03/20/2016 12:33 PM   Modules accepted: Orders

## 2016-03-20 NOTE — Telephone Encounter (Signed)
Pt called and it looks like glipizide Rx was sent to mail order instead of local. Re-sent Rx to [facility] [address] as requ'd.

## 2016-03-21 ENCOUNTER — Telehealth: Payer: Self-pay | Admitting: Radiology

## 2016-03-21 NOTE — Telephone Encounter (Signed)
[facility] that despite what they were told previously the pt would like to pick up prescription there today. Informed pt that it will be ready for pick up today. Informed [facility] to back out of the prescription.

## 2016-04-20 ENCOUNTER — Ambulatory Visit: Payer: Managed Care, Other (non HMO)

## 2016-04-22 ENCOUNTER — Ambulatory Visit (INDEPENDENT_AMBULATORY_CARE_PROVIDER_SITE_OTHER): Payer: Managed Care, Other (non HMO) | Admitting: Family Medicine

## 2016-04-22 VITALS — BP 128/82 | HR 82 | Temp 98.0°F | Resp 17 | Ht 66.0 in | Wt 220.0 lb

## 2016-04-22 DIAGNOSIS — E119 Type 2 diabetes mellitus without complications: Secondary | ICD-10-CM

## 2016-04-22 LAB — GLUCOSE, POCT (MANUAL RESULT ENTRY): POC Glucose: 342 mg/dl — AB (ref 70–99)

## 2016-04-22 MED ORDER — GLUCOSE BLOOD VI STRP: ORAL_STRIP | 12 refills | Status: DC

## 2016-04-22 MED ORDER — AUTO-LANCET MISC: 6 refills | Status: AC

## 2016-04-22 MED ORDER — GLIPIZIDE 10 MG PO TABS: 20.0000 mg | ORAL_TABLET | Freq: Two times a day (BID) | ORAL | 2 refills | Status: DC

## 2016-04-22 NOTE — Patient Instructions (Addendum)
It was good to see today.  I have refilled your lancets and test strips.  Take the glipizide 20 mg in the morning and 20 mg in the evening.  Start with just 20 mg once a day for a few days.    If you start having lightheadedness or feeling nauseous or your blood sugar drops low, let us know.    Come back and see us for a recheck your A1c in February.    IF you received an x-ray today, you will receive an invoice from [facility]. Please contact [facility] at [phone] with questions or concerns regarding your invoice.   IF you received labwork today, you will receive an invoice from [facility]. Please contact [facility] at [phone] with questions or concerns regarding your invoice.   Our billing staff will not be able to assist you with questions regarding bills from these companies.  You will be contacted with the lab results as soon as they are available. The fastest way to get your results is to activate your My Chart account. Instructions are located on the last page of this paperwork. If you have not heard from us regarding the results in 2 weeks, please contact this office.

## 2016-04-22 NOTE — Progress Notes (Signed)
Alejandra Ray is a 55 y.o. female who presents to [facility] today for dm2  1.  DM2:  Patient seen here early November for regular well visits. At that time she is found to have A1c greater than 11. Her previous A1c's have been controlled. She has not done well with metformin in the past and therefore we started her on glipizide. She is taking this 10 mg the morning tomograms evening. She does have lancets and test strips at home. Her CBGs have been running from 284 to 300s.    No hypoglycemic symptoms. No nausea vomiting. No polyuria polydipsia.No illnesses.   ROS as above.  Pertinently, no chest pain, palpitations, SOB, Fever, Chills, Abd pain, N/V/D.   PMH reviewed. Patient is a nonsmoker.   Past Medical History:  Diagnosis Date  . Allergic rhinitis   . Diabetes mellitus   . Hyperlipidemia   . SUI (stress urinary incontinence), female   . Type 2 diabetes mellitus (HCC)    Past Surgical History:  Procedure Laterality Date  . CHOLECYSTECTOMY    . tublation  2001    Medications reviewed. Current Outpatient Prescriptions  Medication Sig Dispense Refill  . aspirin 81 MG tablet Take 81 mg by mouth daily.    . cyclobenzaprine (FLEXERIL) 10 MG tablet TAKE 1 TABLET BY MOUTH AT BEDTIME AS NEEDED 90 tablet 0  . fish oil-omega-3 fatty acids 1000 MG capsule Take 2 g by mouth daily.    . fluticasone (FLONASE) 50 MCG/ACT nasal spray Place 2 sprays into both nostrils at bedtime. 16 g 5  . glipiZIDE (GLUCOTROL) 10 MG tablet Take 10 mg once daily for 3 days then by mouth twice a day 60 tablet 3  . levothyroxine (SYNTHROID, LEVOTHROID) 50 MCG tablet TAKE 1 TABLET (50 MCG TOTAL) BY MOUTH DAILY. 90 tablet 1  . milk thistle 175 MG tablet Take 175 mg by mouth daily.    . Misc Natural Products (RED CLOVER COMBINATION PO) Take 200 mg by mouth daily.    . Multiple Vitamin (MULTIVITAMIN) capsule Take 1 capsule by mouth daily.    . pravastatin (PRAVACHOL) 40 MG tablet Take 1  tablet (40 mg total) by mouth daily. 90 tablet 3  . scopolamine (TRANSDERM-SCOP, 1.5 MG,) 1 MG/3DAYS Place 1 patch (1.5 mg total) onto the skin every 3 (three) days. 10 patch 0  . valACYclovir (VALTREX) 1000 MG tablet Take 1 tablet (1,000 mg total) by mouth 2 (two) times daily. 10 tablet 11  . vitamin B-12 (CYANOCOBALAMIN) 500 MCG tablet Take 500 mcg by mouth daily.     No current facility-administered medications for this visit.      Physical Exam:  BP 128/82 (BP Location: Left Arm, Patient Position: Sitting, Cuff Size: Large)   Pulse 82   Temp 98 F (36.7 C) (Oral)   Resp 17   Ht 5\' 6"  (1.676 m)   Wt 220 lb (99.8 kg)   LMP 08/31/2012   SpO2 96%   BMI 35.51 kg/m  Gen:  Alert, cooperative patient who appears stated age in no acute distress.  Vital signs reviewed. HEENT: EOMI,  MMM Pulm:  Clear to auscultation bilaterally with good air movement.  No wheezes or rales noted.   Cardiac:  Regular rate and rhythm  Exts: Non edematous BL  LE, warm and well perfused.   Assessment and Plan:  1.  DM2: - not at goal - discussed options.  She is still very hesitant about retrying metformin. -  She is also very hesitant about adding new medicines. She thinks she can get her blood sugars down with current glipizide and diet. -Increase her glipizide to max daily dose of 20 mg. This is 12 hour medicine so would twice a day. -Discussed Invokana, and other options. Again she wants to hold off on this. Follow-up in February for repeat A1c. May need to increase/ad more medications at that point.

## 2016-06-11 ENCOUNTER — Ambulatory Visit: Payer: Managed Care, Other (non HMO) | Admitting: Adult Health

## 2016-06-16 ENCOUNTER — Telehealth: Payer: Self-pay | Admitting: *Deleted

## 2016-06-16 ENCOUNTER — Encounter: Payer: Self-pay | Admitting: Neurology

## 2016-06-16 NOTE — Telephone Encounter (Signed)
Spoke to patient - she has been placed the schedule with Dr. Vickey Hugerohmeier on 06/19/15, in an available time slot.

## 2016-06-16 NOTE — Telephone Encounter (Signed)
This pat needs to be worked in, she can't get supplies until she sees provider

## 2016-06-17 ENCOUNTER — Ambulatory Visit: Payer: Managed Care, Other (non HMO) | Admitting: Adult Health

## 2016-06-18 ENCOUNTER — Ambulatory Visit (INDEPENDENT_AMBULATORY_CARE_PROVIDER_SITE_OTHER): Payer: Managed Care, Other (non HMO) | Admitting: Neurology

## 2016-06-18 ENCOUNTER — Encounter: Payer: Self-pay | Admitting: Neurology

## 2016-06-18 VITALS — BP 136/78 | HR 82 | Resp 16 | Ht 66.0 in | Wt 223.0 lb

## 2016-06-18 DIAGNOSIS — Z9989 Dependence on other enabling machines and devices: Secondary | ICD-10-CM | POA: Diagnosis not present

## 2016-06-18 DIAGNOSIS — E669 Obesity, unspecified: Secondary | ICD-10-CM | POA: Insufficient documentation

## 2016-06-18 DIAGNOSIS — G4733 Obstructive sleep apnea (adult) (pediatric): Secondary | ICD-10-CM

## 2016-06-18 DIAGNOSIS — Z6831 Body mass index (BMI) 31.0-31.9, adult: Secondary | ICD-10-CM | POA: Insufficient documentation

## 2016-06-18 NOTE — Progress Notes (Signed)
PATIENT: Alejandra Ray DOB: [birth year]  REASON FOR VISIT: follow up- OSA on CPAP HISTORY FROM: patient  HISTORY OF PRESENT ILLNESS:  HISTORY 06/18/14 [facility]): SWAYZE KOZUCH is a 56 y.o. female , seen here as a revisit from Dr. Audria Nine for a sleep apnea, Compliance to CPAP.  Interval history the patient is here today after her sleep study which took place on 03-09-14 shortly after our initial consultation. She had at the time endorsed the Epworth score at 16 and the fatigue severity score at 43. The patient also endorsed the PHQ9 at 11 points. Her sleep study revealed an AHI of 14.9 which is considered mild apnea and an RDI of 16.6 also in the mild range. When she slept on her back her AHI which consists of apneas and hypotony as meaning cessation of breathing and shallow breathing spells per hour of sleep, increased to 35.6 there was a clear positional component to her apnea. She had frequent periodic limb movements but these did not lead to arousals at least not a significant degree.  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 And today I'm able to look at the download from her machine. The machine is set between a minimum pressure of 5 and maximum pressure of 12 cm water with an EPR of 2 cm.  She's using the machine nightly for 5 hours 11 minutes on average, her compliance is 97% for the last 30 days and 83% for over 4 hours of nightly use.  Her AHI is 2.3. The needs to be no adjustments made also one could argue to set the machine at 9 cm pressure which is close to her 95th percentile need.  The patient reports that she sleeps better more restful and sound when using CPAP - but she seems to have an allergy to some of the plastic parts , especially with the interface touches her nostrils. She has noticed this plastic allergy  in the past with other implement such as bijoux.   I am not clear about the allergy being against silica or latex. She no longer feels sleepy or fatigued, sleeps on average only 5-6 hours but sound!  Alejandra Ray is a 56 year old female with a history of obstructive sleep apnea on CPAP. She returns today for a compliance download. The patient's download indicates that she uses her machine 30 out of 30 days for compliance of 100%. She uses her machine greater than 4 hours 30 out of 30 days for a compliance of 100%. On average she uses her machine 7 hours and 46 minutes. Her residual AHI is 2.1. The patient does not have a significant leak. Her pressure is set between 5-12 cmH20 with EPR of 2. The patient's Epworth sleepiness score is 7 and fatigue severity score is 25. The patient denies any new neurological symptoms. She states that she is sleeping very well with the CPAP. She returns today for an evaluation.  History from the eighth of February  2018,  Alejandra Ray is here for her yearly compliance visit which has been 100% over the last 30 days with average user time 8 hours and 20 minutes, she is using AutoSet CPAP between 5 and 12 cm water with a full-time expiratory pressure relief of 2 cm water and a residual AHI of 2.2 minimal air leaks using a nasal pillow, she has recovered  from her in contact dermatitis that she first displayed, using just Vaseline to protect her skin. The 95th percentile pressure is 8.6 cm water.   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 She has found herself unemployed for 4 months now. She has started to sleep more as she doesn't have to get up early in the morning, and there is probably some component of depression.  REVIEW OF SYSTEMS: Out of a complete 14 system review of symptoms, the patient complains only of the following symptoms, and all other reviewed  systems are negative. Epworth sleepiness score endorsed at 3 points, fatigue severity score at 13 points, the patient reports no nocturia, no breakthrough snoring  ALLERGIES: Allergies  Allergen Reactions  . Codeine     Blacked out  . Other     PLASTICS  . Sulfa Antibiotics Nausea And Vomiting    HOME MEDICATIONS: Outpatient Medications Prior to Visit  Medication Sig Dispense Refill  . aspirin 81 MG tablet Take 81 mg by mouth daily.    . cyclobenzaprine (FLEXERIL) 10 MG tablet TAKE 1 TABLET BY MOUTH AT BEDTIME AS NEEDED 90 tablet 0  . fish oil-omega-3 fatty acids 1000 MG capsule Take 2 g by mouth daily.    . fluticasone (FLONASE) 50 MCG/ACT nasal spray Place 2 sprays into both nostrils at bedtime. 16 g 5  . glipiZIDE (GLUCOTROL) 10 MG tablet Take 2 tablets (20 mg total) by mouth 2 (two) times daily before a meal. 120 tablet 2  . glucose blood test strip Use to check blood sugars daily for type 2 diabetes - Dispense device compatible with glucometer 100 each 12  . Lancet Devices (AUTO-LANCET) MISC Use to check blood sugars daily for type 2 diabetes - Dispense device compatible with glucometer 100 each 6  . levothyroxine (SYNTHROID, LEVOTHROID) 50 MCG tablet TAKE 1 TABLET (50 MCG TOTAL) BY MOUTH DAILY. 90 tablet 1  . milk thistle 175 MG tablet Take 175 mg by mouth daily.    . Misc Natural Products (RED CLOVER COMBINATION PO) Take 200 mg by mouth daily.    . Multiple Vitamin (MULTIVITAMIN) capsule Take 1 capsule by mouth daily.    . pravastatin (PRAVACHOL) 40 MG tablet Take 1 tablet (40 mg total) by mouth daily. 90 tablet 3  . scopolamine (TRANSDERM-SCOP, 1.5 MG,) 1 MG/3DAYS Place 1 patch (1.5 mg total) onto the skin every 3 (three) days. 10 patch 0  . valACYclovir (VALTREX) 1000 MG tablet Take 1 tablet (1,000 mg total) by mouth 2 (two) times daily. 10 tablet 11  . vitamin B-12 (CYANOCOBALAMIN) 500 MCG tablet Take 500 mcg by mouth daily.     No facility-administered medications prior to  visit.     PAST MEDICAL HISTORY: Past Medical History:  Diagnosis Date  . Allergic rhinitis   . Diabetes mellitus   . Hyperlipidemia   . SUI (stress urinary incontinence), female   . Type 2 diabetes mellitus (HCC)     PAST SURGICAL HISTORY: Past Surgical History:  Procedure Laterality Date  . CHOLECYSTECTOMY    . tublation  2001    FAMILY HISTORY: Family History  Problem Relation Age of Onset  . Diabetes Mother   . Hyperlipidemia Mother   . Cancer Father   . Diabetes Sister   . Hyperlipidemia Sister     SOCIAL HISTORY: Social History   Social History  . Marital status:  Married    Spouse name: [name]  . Number of children: 1  . Years of education: College   Occupational History  . PRODUCTION [employer]   Social History Main Topics  . Smoking status: Former Smoker    Quit date: 05/13/1996  . Smokeless tobacco: Never Used  . Alcohol use Yes     Comment: occassionally  . Drug use: Yes    Types: Marijuana     Comment: occassionally  . Sexual activity: Yes   Other Topics Concern  . Not on file   Social History Narrative   Patient is married [name]) and lives at home with her husband.   Patient has one adult child.   Patient is right-handed.   Patient does not drink any caffeine.   Married. Education: some college. Exercise: No      PHYSICAL EXAM  Vitals: BP 136/78   Pulse 82   Resp 16   Ht 5\' 6"  (1.676 m)   Wt 223 lb (101.2 kg)   LMP 08/31/2012   BMI 35.99 kg/m  Last Weight:  Wt Readings from Last 1 Encounters:  06/18/16 223 lb (101.2 kg)       Last Height:   Ht Readings from Last 1 Encounters:  06/18/16 5\' 6"  (1.676 m)    Physical exam:  General: The patient is awake, alert and appears not in acute distress. The patient is well groomed. Head: Normocephalic, atraumatic. Neck is supple. Mallampati 3,  neck circumference:17. Nasal airflow unrestricted, TMJ is not evident .  Retrognathia is not seen.  Cardiovascular:  Regular rate and rhythm ,  without  murmurs or carotid bruit, and without distended neck veins. Respiratory: Lungs are clear to auscultation. Neurologic exam :  alert, oriented to place and time.   Cranial nerves: Pupils are equal and briskly reactive to light.  Facial sensation intact to fine touch. Facial motor strength is symmetric and tongue and uvula move midline. Motor exam: Normal tone, muscle bulk and symmetric strength in all extremities. Deep tendon reflexes: in the  upper and lower extremities are symmetric and intact. Babinski maneuver response is downgoing.  Lab Results  Component Value Date   CHOL 194 08/22/2015   HDL 44 (L) 08/22/2015   LDLCALC 124 08/22/2015   LDLDIRECT 105 (H) 03/24/2012   TRIG 129 08/22/2015   CHOLHDL 4.4 08/22/2015   Lab Results  Component Value Date   HGBA1C 11.8 (H) 03/18/2016   Lab Results  Component Value Date   VITAMINB12 >2000 (H) 01/24/2015   Lab Results  Component Value Date   TSH 3.67 03/18/2016      ASSESSMENT AND PLAN 56 y.o. year old female  has a past medical history of Allergic rhinitis; Diabetes mellitus; Hyperlipidemia; SUI (stress urinary incontinence), female; and Type 2 diabetes mellitus (HCC). here with:  1. OSA on CPAP, highly compliant. 100%  This has been the case since CPAP was initiated 2.5 years ago.  [facility] is her current durable medical equipment company. She will continue using her CPAP nightly. I have sent a prescription to refill her supplies. Patient advised that if her symptoms worsen or she develops any new symptoms she she'll let us know. She will follow-up in one year or sooner if needed.    Lannie Heaps, MD  06/18/2016, 11:14 AM [facility] [address] [place], [place] [address] [phone]

## 2016-06-29 ENCOUNTER — Ambulatory Visit (INDEPENDENT_AMBULATORY_CARE_PROVIDER_SITE_OTHER): Payer: Managed Care, Other (non HMO) | Admitting: Physician Assistant

## 2016-06-29 VITALS — BP 133/79 | HR 70 | Temp 98.3°F | Resp 18 | Ht 66.0 in | Wt 224.6 lb

## 2016-06-29 DIAGNOSIS — M545 Low back pain: Secondary | ICD-10-CM

## 2016-06-29 DIAGNOSIS — G8929 Other chronic pain: Secondary | ICD-10-CM | POA: Diagnosis not present

## 2016-06-29 DIAGNOSIS — E119 Type 2 diabetes mellitus without complications: Secondary | ICD-10-CM | POA: Diagnosis not present

## 2016-06-29 DIAGNOSIS — E034 Atrophy of thyroid (acquired): Secondary | ICD-10-CM

## 2016-06-29 MED ORDER — LEVOTHYROXINE SODIUM 50 MCG PO TABS: ORAL_TABLET | ORAL | 1 refills | Status: DC

## 2016-06-29 MED ORDER — CYCLOBENZAPRINE HCL 10 MG PO TABS: ORAL_TABLET | ORAL | 0 refills | Status: DC

## 2016-06-29 MED ORDER — METFORMIN HCL 500 MG PO TABS: 500.0000 mg | ORAL_TABLET | Freq: Two times a day (BID) | ORAL | 3 refills | Status: DC

## 2016-06-29 MED ORDER — METFORMIN HCL 1000 MG PO TABS: 1000.0000 mg | ORAL_TABLET | Freq: Two times a day (BID) | ORAL | 3 refills | Status: DC

## 2016-06-29 MED ORDER — GLIPIZIDE 10 MG PO TABS: 20.0000 mg | ORAL_TABLET | Freq: Two times a day (BID) | ORAL | 1 refills | Status: DC

## 2016-06-29 NOTE — Patient Instructions (Addendum)
Once the metformin is on board, we'll plan to reduce the glipizide back down. And then, once you have health insurance again, we'll plan to move you off of the glipizide and onto something else (like Invokana or [place]xiga).    IF you received an x-ray today, you will receive an invoice from [facility]. Please contact [facility] at [phone] with questions or concerns regarding your invoice.   IF you received labwork today, you will receive an invoice from [facility]. Please contact [facility] at [phone] with questions or concerns regarding your invoice.   Our billing staff will not be able to assist you with questions regarding bills from these companies.  You will be contacted with the lab results as soon as they are available. The fastest way to get your results is to activate your My Chart account. Instructions are located on the last page of this paperwork. If you have not heard from us regarding the results in 2 weeks, please contact this office.

## 2016-06-29 NOTE — Progress Notes (Signed)
Patient ID: Alejandra Ray, female    DOB: 01-26-1961, 56 y.o.   MRN: [id number]  PCP: No PCP Per Patient  Chief Complaint  Patient presents with  . Blood sugar check    states it is not under control; still can not get it under 220    Subjective:   Presents for evaluation of diabetes.  At her visit 08/22/2015, her glucose had been well controlled, with A1C <6.5% for the previous 4 checks. Her regimen was not changed at that time.  She went of a trip in the summer and forgot to bring the metformin with her and found that her home glucose readings remained "good," so she proceeded with lifestyle modifications as her only treatment for diabetes.  Her next visit here was 03/18/2016, at which time A1C was noted to be 11.8%. She was contacted and advised to start glipizide 10 mh BID. She reported that metformin made her glucose go UP, and so the glipizide was started with the intention to revisit the addition of metformin in the future (see phone notes 03/19/2016).  Follow-up visit on 04/22/2016 she reported home readings 280's-300's, and as she remained unwilling to resume insulin, the glipizide was increased. The chart indicates that the glipizide was increased to 20 mg total daily dose (10 mg BID), but she is reporting that she currently takes 20 mg BID.  She is concerned about the cost of medications going forward, as she will lose insurance in about a month. 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 She meets with a counselor at the unemployment office later today.   Review of Systems Denies chest pain, shortness of breath, HA, dizziness, vision change, nausea, vomiting, diarrhea, constipation, melena, hematochezia, dysuria, increased urinary urgency or frequency, increased hunger or thirst, unintentional weight change, unexplained myalgias or  arthralgias, rash.     Patient Active Problem List   Diagnosis Date Noted  . Obesity (BMI 30-39.9) 06/18/2016  . OSA on CPAP 06/18/2014  . UARS (upper airway resistance syndrome) 06/18/2014  . Obesity (BMI 30.0-34.9) 06/18/2014  . Snoring 03/15/2014  . Stress headaches 03/15/2014  . Hypothyroidism 01/24/2014  . Screening for breast cancer 08/23/2013  . Perimenopausal 08/03/2011  . SUI (stress urinary incontinence, female) 08/03/2011  . Allergic rhinitis 08/03/2011  . 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 RLS (restless legs syndrome) 08/03/2011  . Type 2 diabetes mellitus (HCC)      Prior to Admission medications   Medication Sig Start Date End Date Taking? Authorizing Provider  aspirin 81 MG tablet Take 81 mg by mouth daily.   Yes Historical Provider, MD  cyclobenzaprine (FLEXERIL) 10 MG tablet TAKE 1 TABLET BY MOUTH AT BEDTIME AS NEEDED 07/29/15  Yes Sherren Mocha, MD  fish oil-omega-3 fatty acids 1000 MG capsule Take 2 g by mouth daily.   Yes Historical Provider, MD  fluticasone (FLONASE) 50 MCG/ACT nasal spray Place 2 sprays into both nostrils at bedtime. 03/18/16  Yes Tobey Grim, MD  glipiZIDE (GLUCOTROL) 10 MG tablet Take 2 tablets (20 mg total) by mouth 2 (two) times daily before a meal. 04/22/16  Yes Tobey Grim, MD  glucose blood test strip Use to check blood sugars daily for type 2 diabetes - Dispense device compatible with glucometer 04/22/16  Yes Tobey Grim, MD  Lancet Devices (AUTO-LANCET) MISC Use to  check blood sugars daily for type 2 diabetes - Dispense device compatible with glucometer 04/22/16  Yes Tobey GrimJeffrey H Walden, MD  levothyroxine (SYNTHROID, LEVOTHROID) 50 MCG tablet TAKE 1 TABLET (50 MCG TOTAL) BY MOUTH DAILY. 03/18/16  Yes Tobey GrimJeffrey H Walden, MD  milk thistle 175 MG tablet Take 175 mg by mouth daily.   Yes Historical Provider, MD  Misc Natural Products (RED CLOVER COMBINATION PO) Take 200 mg by mouth daily.   Yes Historical Provider, MD  Multiple Vitamin  (MULTIVITAMIN) capsule Take 1 capsule by mouth daily.   Yes Historical Provider, MD  pravastatin (PRAVACHOL) 40 MG tablet Take 1 tablet (40 mg total) by mouth daily. 09/08/15  Yes Sherren MochaEva N Shaw, MD  scopolamine (TRANSDERM-SCOP, 1.5 MG,) 1 MG/3DAYS Place 1 patch (1.5 mg total) onto the skin every 3 (three) days. 10/30/15  Yes Morrell RiddleSarah L Weber, PA-C  valACYclovir (VALTREX) 1000 MG tablet Take 1 tablet (1,000 mg total) by mouth 2 (two) times daily. 08/22/15  Yes Sherren MochaEva N Shaw, MD  vitamin B-12 (CYANOCOBALAMIN) 500 MCG tablet Take 500 mcg by mouth daily.   Yes Historical Provider, MD     Allergies  Allergen Reactions  . Codeine     Blacked out  . Other     PLASTICS  . Sulfa Antibiotics Nausea And Vomiting       Objective:  Physical Exam  Constitutional: She is oriented to person, place, and time. She appears well-developed and well-nourished. She is active and cooperative. No distress.  BP 133/79   Pulse 70   Temp 98.3 F (36.8 C) (Oral)   Resp 18   Ht 5\' 6"  (1.676 m)   Wt 224 lb 9.6 oz (101.9 kg)   LMP 08/31/2012   SpO2 96%   BMI 36.25 kg/m   HENT:  Head: Normocephalic and atraumatic.  Right Ear: Hearing normal.  Left Ear: Hearing normal.  Eyes: Conjunctivae are normal. No scleral icterus.  Neck: Normal range of motion. Neck supple. No thyromegaly present.  Cardiovascular: Normal rate, regular rhythm and normal heart sounds.   Pulses:      Radial pulses are 2+ on the right side, and 2+ on the left side.  Pulmonary/Chest: Effort normal and breath sounds normal.  Lymphadenopathy:       Head (right side): No tonsillar, no preauricular, no posterior auricular and no occipital adenopathy present.       Head (left side): No tonsillar, no preauricular, no posterior auricular and no occipital adenopathy present.    She has no cervical adenopathy.       Right: No supraclavicular adenopathy present.       Left: No supraclavicular adenopathy present.  Neurological: She is alert and oriented to  person, place, and time. No sensory deficit.  Skin: Skin is warm, dry and intact. No rash noted. No cyanosis or erythema. Nails show no clubbing.  Psychiatric: She has a normal mood and affect. Her speech is normal and behavior is normal.    Diabetic Foot Exam - Simple   Simple Foot Form Diabetic Foot exam was performed with the following findings:  Yes 06/29/2016 11:09 AM  Visual Inspection No deformities, no ulcerations, no other skin breakdown bilaterally:  Yes Sensation Testing Intact to touch and monofilament testing bilaterally:  Yes Pulse Check Posterior Tibialis and Dorsalis pulse intact bilaterally:  Yes Comments           Assessment & Plan:   1. Type 2 diabetes mellitus without complication, without long-term current use of insulin (  HCC) Await A1C. Resume metformin (500 mg BID then 1000 mg BID), explaining that it was not that the metformin caused an increase in the glucose, but rather that it was inadequate alone to control it. Refilled the glipizide the was she is taking it, with the intention of reducing it back to 10 mg BID once the metformin is on board. Once she has health insurance again, will recommend the addition of a newer agent with the hopes of stopping the sulfonylurea all together. Recommended starting an ACEI once the renal function results are available, but she is reluctant to do that, too. - glipiZIDE (GLUCOTROL) 10 MG tablet; Take 2 tablets (20 mg total) by mouth 2 (two) times daily before a meal.  Dispense: 360 tablet; Refill: 1 - Hemoglobin A1c - HM DIABETES FOOT EXAM - Comprehensive metabolic panel - Microalbumin, urine - Lipid panel - metFORMIN (GLUCOPHAGE) 500 MG tablet; Take 1 tablet (500 mg total) by mouth 2 (two) times daily with a meal.  Dispense: 60 tablet; Refill: 3 - metFORMIN (GLUCOPHAGE) 1000 MG tablet; Take 1 tablet (1,000 mg total) by mouth 2 (two) times daily with a meal.  Dispense: 180 tablet; Refill: 3  2. Chronic bilateral low back  pain without sciatica Stable. - cyclobenzaprine (FLEXERIL) 10 MG tablet; TAKE 1 TABLET BY MOUTH AT BEDTIME AS NEEDED  Dispense: 90 tablet; Refill: 0  3. Hypothyroidism due to acquired atrophy of thyroid Stable x 2 years. Last checked 03/2016. Update today then Q6 months. - TSH - levothyroxine (SYNTHROID, LEVOTHROID) 50 MCG tablet; TAKE 1 TABLET (50 MCG TOTAL) BY MOUTH DAILY.  Dispense: 90 tablet; Refill: 1   Return in about 3 months (around 09/26/2016) for re-evaluation of diabetes.   Fernande Bras, PA-C Physician Assistant-Certified Primary Care at [facility]

## 2016-06-30 LAB — COMPREHENSIVE METABOLIC PANEL
ALBUMIN: 4.8 g/dL (ref 3.5–5.5)
ALT: 85 IU/L — ABNORMAL HIGH (ref 0–32)
AST: 46 IU/L — AB (ref 0–40)
Albumin/Globulin Ratio: 1.7 (ref 1.2–2.2)
Alkaline Phosphatase: 61 IU/L (ref 39–117)
BUN / CREAT RATIO: 20 (ref 9–23)
BUN: 12 mg/dL (ref 6–24)
Bilirubin Total: 0.5 mg/dL (ref 0.0–1.2)
CALCIUM: 9.7 mg/dL (ref 8.7–10.2)
CO2: 22 mmol/L (ref 18–29)
CREATININE: 0.59 mg/dL (ref 0.57–1.00)
Chloride: 97 mmol/L (ref 96–106)
GFR calc Af Amer: 117 mL/min/{1.73_m2} (ref >59)
GFR, EST NON AFRICAN AMERICAN: 101 mL/min/{1.73_m2} (ref >59)
GLOBULIN, TOTAL: 2.9 g/dL (ref 1.5–4.5)
Glucose: 254 mg/dL — ABNORMAL HIGH (ref 65–99)
Potassium: 4.5 mmol/L (ref 3.5–5.2)
SODIUM: 137 mmol/L (ref 134–144)
Total Protein: 7.7 g/dL (ref 6.0–8.5)

## 2016-06-30 LAB — LIPID PANEL
CHOLESTEROL TOTAL: 188 mg/dL (ref 100–199)
Chol/HDL Ratio: 4.5 ratio units — ABNORMAL HIGH (ref 0.0–4.4)
HDL: 42 mg/dL (ref >39)
LDL CALC: 121 mg/dL — AB (ref 0–99)
Triglycerides: 126 mg/dL (ref 0–149)
VLDL CHOLESTEROL CAL: 25 mg/dL (ref 5–40)

## 2016-06-30 LAB — TSH: TSH: 2.23 u[IU]/mL (ref 0.450–4.500)

## 2016-06-30 LAB — HEMOGLOBIN A1C
Est. average glucose Bld gHb Est-mCnc: 249 mg/dL
Hgb A1c MFr Bld: 10.3 % — ABNORMAL HIGH (ref 4.8–5.6)

## 2016-06-30 LAB — MICROALBUMIN, URINE: Microalbumin, Urine: 15.8 ug/mL

## 2016-07-02 ENCOUNTER — Encounter: Payer: Self-pay | Admitting: Physician Assistant

## 2016-09-22 ENCOUNTER — Other Ambulatory Visit: Payer: Self-pay | Admitting: Family Medicine

## 2016-11-12 ENCOUNTER — Telehealth: Payer: Self-pay | Admitting: Family Medicine

## 2016-11-12 ENCOUNTER — Other Ambulatory Visit: Payer: Self-pay | Admitting: Emergency Medicine

## 2016-11-12 DIAGNOSIS — E034 Atrophy of thyroid (acquired): Secondary | ICD-10-CM

## 2016-11-12 MED ORDER — LEVOTHYROXINE SODIUM 50 MCG PO TABS: ORAL_TABLET | ORAL | 0 refills | Status: DC

## 2016-11-12 NOTE — Telephone Encounter (Signed)
PT CALLING FOR A REFILL ON LEVOTHYROXINE UNTIL HER APPOINTMENT WITH [facility]LE [facility] [address]

## 2016-11-12 NOTE — Telephone Encounter (Signed)
Left message 30 day supply given until seen by provider 12/04/16

## 2016-12-04 ENCOUNTER — Ambulatory Visit: Payer: Managed Care, Other (non HMO) | Admitting: Physician Assistant

## 2016-12-09 ENCOUNTER — Ambulatory Visit (INDEPENDENT_AMBULATORY_CARE_PROVIDER_SITE_OTHER): Payer: Managed Care, Other (non HMO) | Admitting: Physician Assistant

## 2016-12-09 ENCOUNTER — Encounter: Payer: Self-pay | Admitting: Physician Assistant

## 2016-12-09 VITALS — BP 126/81 | HR 82 | Temp 97.8°F | Resp 18 | Ht 66.0 in | Wt 222.2 lb

## 2016-12-09 DIAGNOSIS — Z23 Encounter for immunization: Secondary | ICD-10-CM | POA: Diagnosis not present

## 2016-12-09 DIAGNOSIS — E034 Atrophy of thyroid (acquired): Secondary | ICD-10-CM

## 2016-12-09 DIAGNOSIS — E119 Type 2 diabetes mellitus without complications: Secondary | ICD-10-CM | POA: Diagnosis not present

## 2016-12-09 DIAGNOSIS — B009 Herpesviral infection, unspecified: Secondary | ICD-10-CM

## 2016-12-09 MED ORDER — VALACYCLOVIR HCL 1 G PO TABS: 1000.0000 mg | ORAL_TABLET | Freq: Two times a day (BID) | ORAL | 99 refills | Status: DC

## 2016-12-09 NOTE — Patient Instructions (Addendum)
We recommend that you schedule a mammogram for breast cancer screening. Typically, you do not need a referral to do this. Please contact a local imaging center to schedule your mammogram.  [facility] - [phone]  *ask for the Radiology Department [facility]) - [phone] or [phone]  [facility] - [phone] [facility] - [phone] [facility] - [phone]  *ask for the Radiology Department [facility] - [phone]  *ask for the Radiology Department [facility] - [phone]  *ask for the Mammography Department [facility] - [phone]     IF you received an x-ray today, you will receive an invoice from [facility]. Please contact [facility] at [phone] with questions or concerns regarding your invoice.   IF you received labwork today, you will receive an invoice from [facility]. Please contact [facility] at [phone] with questions or concerns regarding your invoice.   Our billing staff will not be able to assist you with questions regarding bills from these companies.  You will be contacted with the lab results as soon as they are available. The fastest way to get your results is to activate your My Chart account. Instructions are located on the last page of this paperwork. If you have not heard from us regarding the results in 2 weeks, please contact this office.      

## 2016-12-09 NOTE — Progress Notes (Signed)
Chief Complaint  Patient presents with  . Medication Refill    Test strips, Levothroxine 50 MCg, Metformin 1000 MG, Glipizide 10 MG, Flonase, Valtrex 1000 MG   HPI Alejandra Ray is a 56 yo female with a significant past medical history of diabetes and hypothryoidism who comes to the clinic today with a chief complaint of needing medications refilled. She was without insurance for 3 months and just got it back today.   Patient's blood work was taken this morning in a fasting state. Patient reports she has never taken her prescribed Pravastatin.  Diabetes Started a diet this Monday with her husband. Plan includes all food groups, while restricting portions and cutting out processed sugars and fats.  Sees an eye specialist once a year. Last appointment was in January 2018.  Hypothyroidism She admits  she is sweating more and is fatigued, but attributes this to her new job in which she is activity walking and climbing machines and ladders. Her workspace is not air conditioned. She denies dry skin, hair thinning, and constipation  Health maintenance  Is in need of a mammogram this year. Is due for a tetanus vaccine.  Review of Systems  Constitutional: Positive for malaise/fatigue (attributes to new job ). Negative for chills, fever and weight loss.  HENT: Negative for ear pain, sore throat and tinnitus.   Eyes: Negative for blurred vision.  Respiratory: Negative for cough, shortness of breath and wheezing.   Cardiovascular: Negative for chest pain, palpitations and leg swelling.  Gastrointestinal: Negative for blood in stool, constipation, diarrhea, melena, nausea and vomiting.  Genitourinary: Negative for dysuria, hematuria and urgency.  Skin: Negative for itching and rash.  Neurological: Negative for headaches.  Psychiatric/Behavioral: Negative for depression and suicidal ideas.    Patient Active Problem List   Diagnosis Date Noted  . Obesity (BMI 30-39.9) 06/18/2016  . OSA on  CPAP 06/18/2014  . UARS (upper airway resistance syndrome) 06/18/2014  . Obesity (BMI 30.0-34.9) 06/18/2014  . Snoring 03/15/2014  . Stress headaches 03/15/2014  . Hypothyroidism 01/24/2014  . Screening for breast cancer 08/23/2013  . Perimenopausal 08/03/2011  . SUI (stress urinary incontinence, female) 08/03/2011  . Allergic rhinitis 08/03/2011  . 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 RLS (restless legs syndrome) 08/03/2011  . Type 2 diabetes mellitus (HCC)    Prior to Admission medications   Medication Sig Start Date End Date Taking? Authorizing Provider  aspirin 81 MG tablet Take 81 mg by mouth daily.   Yes [provider]  cyclobenzaprine (FLEXERIL) 10 MG tablet TAKE 1 TABLET BY MOUTH AT BEDTIME AS NEEDED 06/29/16  Yes Jeffery, Chelle, PA-C  fish oil-omega-3 fatty acids 1000 MG capsule Take 2 g by mouth daily.   Yes [provider]  fluticasone (FLONASE) 50 MCG/ACT nasal spray Place 2 sprays into both nostrils at bedtime. 03/18/16  Yes Tobey GrimWalden, Jeffrey H, MD  glipiZIDE (GLUCOTROL) 10 MG tablet Take 2 tablets (20 mg total) by mouth 2 (two) times daily before a meal. 06/29/16  Yes Jeffery, Chelle, PA-C  glucose blood test strip Use to check blood sugars daily for type 2 diabetes - Dispense device compatible with glucometer 04/22/16  Yes Tobey GrimWalden, Jeffrey H, MD  Lancet Devices (AUTO-LANCET) MISC Use to check blood sugars daily for type 2 diabetes - Dispense device compatible with glucometer 04/22/16  Yes Tobey GrimWalden, Jeffrey H, MD  levothyroxine (SYNTHROID, LEVOTHROID) 50 MCG tablet TAKE 1 TABLET (50 MCG TOTAL) BY MOUTH DAILY. 11/12/16  Yes Jeffery, Chelle, PA-C  metFORMIN (GLUCOPHAGE) 1000  MG tablet Take 1 tablet (1,000 mg total) by mouth 2 (two) times daily with a meal. 06/29/16  Yes Jeffery, Chelle, PA-C  milk thistle 175 MG tablet Take 175 mg by mouth daily.   Yes [provider]  Misc Natural Products (RED CLOVER COMBINATION PO) Take 200 mg by mouth daily.   Yes [provider]  Multiple Vitamin (MULTIVITAMIN) capsule Take 1 capsule by mouth daily.   Yes [provider]  valACYclovir (VALTREX) 1000 MG tablet Take 1 tablet (1,000 mg total) by mouth 2 (two) times daily. 08/22/15  Yes Sherren MochaShaw, Eva N, MD  vitamin B-12 (CYANOCOBALAMIN) 500 MCG tablet Take 500 mcg by mouth daily.   Yes [provider]  metFORMIN (GLUCOPHAGE) 500 MG tablet Take 1 tablet (500 mg total) by mouth 2 (two) times daily with a meal. Patient not taking: Reported on 12/09/2016 06/29/16   Porfirio OarJeffery, Chelle, PA-C  pravastatin (PRAVACHOL) 40 MG tablet Take 1 tablet (40 mg total) by mouth daily. Patient not taking: Reported on 12/09/2016 09/08/15   Sherren MochaShaw, Eva N, MD  scopolamine (TRANSDERM-SCOP, 1.5 MG,) 1 MG/3DAYS Place 1 patch (1.5 mg total) onto the skin every 3 (three) days. Patient not taking: Reported on 12/09/2016 10/30/15   Morrell RiddleWeber, Sarah L, PA-C   Allergies  Allergen Reactions  . Codeine     Blacked out  . Other     PLASTICS  . Sulfa Antibiotics Nausea And Vomiting    Vital signs BP 126/81 (BP Location: Right Arm, Patient Position: Sitting, Cuff Size: Large)   Pulse 82   Temp 97.8 F (36.6 C) (Oral)   Resp 18   Ht 5\' 6"  (1.676 m)   Wt 222 lb 3.2 oz (100.8 kg)   LMP 08/31/2012   SpO2 98%   BMI 35.86 kg/m   Physical Exam  Constitutional: She appears well-developed and well-nourished. She is cooperative.  Eyes:  Fundoscopic exam:      The right eye shows red reflex.       The left eye shows red reflex.  Cardiovascular: Normal rate, regular rhythm and normal heart sounds.   Pulmonary/Chest: Effort normal and breath sounds normal.  Lymphadenopathy:    She has no cervical adenopathy.       Right: No supraclavicular adenopathy present.       Left: No supraclavicular adenopathy present.  Neurological: She is alert.   Assessment and Plan 1. Type 2 diabetes mellitus without complication, without long-term current use of insulin (HCC) Continue making healthy diet  choices - Hemoglobin A1c - Comprehensive metabolic panel - Lipid panel  2. Hypothyroidism due to acquired atrophy of thyroid - TSH  3. HSV-2 infection When you feel the sensation on an outbreak, take 2 pills, then 12 hours after that initial dose, take 2 more pills. - valACYclovir (VALTREX) 1000 MG tablet; Take 1 tablet (1,000 mg total) by mouth 2 (two) times daily.  Dispense: 10 tablet; Refill: prn  4. Need for Tdap vaccination - Tdap vaccine greater than or equal to 7yo IM  Please call one of the number we listed to schedule a mammogram.   Byrd HesselbachMaria "Mia" Dekendrick Uzelac, PA-Student [facility] [employer] Physician Assistant [facility] 12/09/16 6:41 PM

## 2016-12-09 NOTE — Progress Notes (Signed)
Patient ID: Alejandra Ray, female    DOB: 1960/10/28, 56 y.o.   MRN: [id number]  PCP: Patient, No Pcp Per  Chief Complaint  Patient presents with  . Medication Refill    Test strips, Levothroxine 50 MCg, Metformin 1000 MG, Glipizide 10 MG, Flonase, Valtrex 1000 MG    Subjective:   Presents for medication refills. This is her first visit since 06/2016. She has insurance now after a 3 month gap, and returns for follow-up care.  Her new job is very active. 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 She has recently started a healthier eating plan with her husband. Never took the pravastatin prescribed.  She has hypothyroidism, and notes she sweats a lot and is tired, both of which she relates to her work and the environment. Feels like her levothyroxine dose is right.    Review of Systems Constitutional: Positive for malaise/fatigue (attributes to new job ). Negative for chills, fever and weight loss.  HENT: Negative for ear pain, sore throat and tinnitus.   Eyes: Negative for blurred vision.  Respiratory: Negative for cough, shortness of breath and wheezing.   Cardiovascular: Negative for chest pain, palpitations and leg swelling.  Gastrointestinal: Negative for blood in stool, constipation, diarrhea, melena, nausea and vomiting.  Genitourinary: Negative for dysuria, hematuria and urgency.  Skin: Negative for itching and rash.  Neurological: Negative for headaches.  Psychiatric/Behavioral: Negative for depression and suicidal ideas.     Patient Active Problem List   Diagnosis Date Noted  . Obesity (BMI 30-39.9) 06/18/2016  . OSA on CPAP 06/18/2014  . UARS (upper airway resistance syndrome) 06/18/2014  . Obesity (BMI 30.0-34.9) 06/18/2014  . Snoring 03/15/2014  . Stress headaches 03/15/2014  . Hypothyroidism 01/24/2014  . Screening for breast cancer 08/23/2013  . Perimenopausal 08/03/2011  . SUI (stress urinary  incontinence, female) 08/03/2011  . Allergic rhinitis 08/03/2011  . 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 RLS (restless legs syndrome) 08/03/2011  . Type 2 diabetes mellitus (HCC)      Prior to Admission medications   Medication Sig Start Date End Date Taking? Authorizing Provider  aspirin 81 MG tablet Take 81 mg by mouth daily.   Yes [provider]  cyclobenzaprine (FLEXERIL) 10 MG tablet TAKE 1 TABLET BY MOUTH AT BEDTIME AS NEEDED 06/29/16  Yes Rayya Yagi, PA-C  fish oil-omega-3 fatty acids 1000 MG capsule Take 2 g by mouth daily.   Yes [provider]  fluticasone (FLONASE) 50 MCG/ACT nasal spray Place 2 sprays into both nostrils at bedtime. 03/18/16  Yes Tobey Grim, MD  glipiZIDE (GLUCOTROL) 10 MG tablet Take 2 tablets (20 mg total) by mouth 2 (two) times daily before a meal. 06/29/16  Yes Teaghan Formica, PA-C  glucose blood test strip Use to check blood sugars daily for type 2 diabetes - Dispense device compatible with glucometer 04/22/16  Yes Tobey Grim, MD  Lancet Devices (AUTO-LANCET) MISC Use to check blood sugars daily for type 2 diabetes - Dispense device compatible with glucometer 04/22/16  Yes Tobey Grim, MD  levothyroxine (SYNTHROID, LEVOTHROID) 50 MCG tablet TAKE 1 TABLET (50 MCG TOTAL) BY MOUTH DAILY. 11/12/16  Yes Chibuikem Thang, PA-C  metFORMIN (GLUCOPHAGE) 1000 MG tablet Take 1 tablet (1,000 mg total) by mouth 2 (two) times daily with a meal. 06/29/16  Yes Treyson Axel, PA-C  milk thistle 175 MG tablet Take 175 mg by mouth daily.   Yes  [provider]  Misc Natural Products (RED CLOVER COMBINATION PO) Take 200 mg by mouth daily.   Yes [provider]  Multiple Vitamin (MULTIVITAMIN) capsule Take 1 capsule by mouth daily.   Yes [provider]  valACYclovir (VALTREX) 1000 MG tablet Take 1 tablet (1,000 mg total) by mouth 2 (two) times daily. 08/22/15  Yes Sherren MochaShaw, Eva N, MD  vitamin B-12 (CYANOCOBALAMIN) 500 MCG  tablet Take 500 mcg by mouth daily.   Yes [provider]  pravastatin (PRAVACHOL) 40 MG tablet Take 1 tablet (40 mg total) by mouth daily. Patient not taking: Reported on 12/09/2016 09/08/15   Sherren MochaShaw, Eva N, MD  scopolamine (TRANSDERM-SCOP, 1.5 MG,) 1 MG/3DAYS Place 1 patch (1.5 mg total) onto the skin every 3 (three) days. Patient not taking: Reported on 12/09/2016 10/30/15   Morrell RiddleWeber, Sarah L, PA-C     Allergies  Allergen Reactions  . Codeine     Blacked out  . Other     PLASTICS  . Sulfa Antibiotics Nausea And Vomiting       Objective:  Physical Exam  Constitutional: She is oriented to person, place, and time. She appears well-developed and well-nourished. She is active and cooperative. No distress.  BP 126/81 (BP Location: Right Arm, Patient Position: Sitting, Cuff Size: Large)   Pulse 82   Temp 97.8 F (36.6 C) (Oral)   Resp 18   Ht 5\' 6"  (1.676 m)   Wt 222 lb 3.2 oz (100.8 kg)   LMP 08/31/2012   SpO2 98%   BMI 35.86 kg/m   HENT:  Head: Normocephalic and atraumatic.  Right Ear: Hearing normal.  Left Ear: Hearing normal.  Eyes: Conjunctivae are normal. No scleral icterus.  Neck: Normal range of motion. Neck supple. No thyromegaly present.  Cardiovascular: Normal rate, regular rhythm and normal heart sounds.   Pulses:      Radial pulses are 2+ on the right side, and 2+ on the left side.  Pulmonary/Chest: Effort normal and breath sounds normal.  Lymphadenopathy:       Head (right side): No tonsillar, no preauricular, no posterior auricular and no occipital adenopathy present.       Head (left side): No tonsillar, no preauricular, no posterior auricular and no occipital adenopathy present.    She has no cervical adenopathy.       Right: No supraclavicular adenopathy present.       Left: No supraclavicular adenopathy present.  Neurological: She is alert and oriented to person, place, and time. No sensory deficit.  Skin: Skin is warm, dry and intact. No rash noted. No  cyanosis or erythema. Nails show no clubbing.  Psychiatric: She has a normal mood and affect. Her speech is normal and behavior is normal.       Assessment & Plan:   Problem List Items Addressed This Visit    Type 2 diabetes mellitus with complication, without long-term current use of insulin (HCC) - Primary    Await labs. Adjust regimen as indicated by results. New job has increased her physical activity. New eating plan began 5 days ago.      HSV-2 infection    Requests only #10 tablets for episodic treatment.      Relevant Medications   valACYclovir (VALTREX) 1000 MG tablet   Hypothyroidism    Await labs. Adjust regimen as indicated by results.       Relevant Orders   TSH (Completed)    Other Visit Diagnoses    Need for Tdap  vaccination       Relevant Orders   Tdap vaccine greater than or equal to 7yo IM (Completed)       Return in about 3 months (around 03/11/2017) for re-evaluation of diabetes, cholesterol, etc.   Fernande Brashelle S. Zeriyah Wain, PA-C Primary Care at [facility]

## 2016-12-10 LAB — COMPREHENSIVE METABOLIC PANEL
A/G RATIO: 1.7 (ref 1.2–2.2)
ALBUMIN: 4.9 g/dL (ref 3.5–5.5)
ALT: 102 IU/L — ABNORMAL HIGH (ref 0–32)
AST: 71 IU/L — ABNORMAL HIGH (ref 0–40)
Alkaline Phosphatase: 48 IU/L (ref 39–117)
BUN/Creatinine Ratio: 21 (ref 9–23)
BUN: 14 mg/dL (ref 6–24)
Bilirubin Total: 0.7 mg/dL (ref 0.0–1.2)
CALCIUM: 9.5 mg/dL (ref 8.7–10.2)
CO2: 19 mmol/L — AB (ref 20–29)
CREATININE: 0.68 mg/dL (ref 0.57–1.00)
Chloride: 99 mmol/L (ref 96–106)
GFR calc Af Amer: 112 mL/min/{1.73_m2} (ref >59)
GFR, EST NON AFRICAN AMERICAN: 97 mL/min/{1.73_m2} (ref >59)
GLOBULIN, TOTAL: 2.9 g/dL (ref 1.5–4.5)
Glucose: 170 mg/dL — ABNORMAL HIGH (ref 65–99)
POTASSIUM: 4.6 mmol/L (ref 3.5–5.2)
SODIUM: 137 mmol/L (ref 134–144)
Total Protein: 7.8 g/dL (ref 6.0–8.5)

## 2016-12-10 LAB — LIPID PANEL
Chol/HDL Ratio: 4.6 ratio — ABNORMAL HIGH (ref 0.0–4.4)
Cholesterol, Total: 184 mg/dL (ref 100–199)
HDL: 40 mg/dL (ref >39)
LDL CALC: 120 mg/dL — AB (ref 0–99)
Triglycerides: 118 mg/dL (ref 0–149)
VLDL Cholesterol Cal: 24 mg/dL (ref 5–40)

## 2016-12-10 LAB — TSH: TSH: 3.62 u[IU]/mL (ref 0.450–4.500)

## 2016-12-10 LAB — HEMOGLOBIN A1C
Est. average glucose Bld gHb Est-mCnc: 154 mg/dL
Hgb A1c MFr Bld: 7 % — ABNORMAL HIGH (ref 4.8–5.6)

## 2016-12-10 MED ORDER — GLUCOSE BLOOD VI STRP: ORAL_STRIP | 99 refills | Status: DC

## 2016-12-10 NOTE — Addendum Note (Signed)
Addended by: Fernande BrasJEFFERY, Domingos Riggi S on: 12/10/2016 11:39 PM   Modules accepted: Orders

## 2016-12-10 NOTE — Assessment & Plan Note (Signed)
Await labs. Adjust regimen as indicated by results.  

## 2016-12-10 NOTE — Assessment & Plan Note (Signed)
Requests only #10 tablets for episodic treatment.

## 2016-12-10 NOTE — Assessment & Plan Note (Signed)
Await labs. Adjust regimen as indicated by results. New job has increased her physical activity. New eating plan began 5 days ago.

## 2016-12-12 MED ORDER — ATORVASTATIN CALCIUM 40 MG PO TABS: 40.0000 mg | ORAL_TABLET | Freq: Every day | ORAL | 3 refills | Status: DC

## 2016-12-12 NOTE — Addendum Note (Signed)
Addended by: Fernande BrasJEFFERY, Tywon Niday S on: 12/12/2016 09:51 PM   Modules accepted: Orders

## 2016-12-21 ENCOUNTER — Other Ambulatory Visit: Payer: Self-pay | Admitting: Physician Assistant

## 2016-12-21 DIAGNOSIS — E034 Atrophy of thyroid (acquired): Secondary | ICD-10-CM

## 2016-12-29 ENCOUNTER — Telehealth: Payer: Self-pay | Admitting: Physician Assistant

## 2016-12-29 DIAGNOSIS — E119 Type 2 diabetes mellitus without complications: Secondary | ICD-10-CM

## 2016-12-29 NOTE — Telephone Encounter (Signed)
Pt would like a refill on her glipiZIDE (GLUCOTROL) 10 MG tablet [[id number]], metFORMIN (GLUCOPHAGE) 1000 MG tablet [[id number]] And fluticasone (FLONASE) 50 MCG/ACT nasal spray [[id number]]. She would like [place] [facility] use [facility] [address]. Please advise at [phone]

## 2016-12-30 ENCOUNTER — Other Ambulatory Visit: Payer: Self-pay | Admitting: *Deleted

## 2016-12-30 DIAGNOSIS — J301 Allergic rhinitis due to pollen: Secondary | ICD-10-CM

## 2016-12-30 MED ORDER — FLUTICASONE PROPIONATE 50 MCG/ACT NA SUSP: 2.0000 | Freq: Every day | NASAL | 5 refills | Status: DC

## 2016-12-30 NOTE — Telephone Encounter (Signed)
[facility] is [facility] no longer using them?

## 2017-01-04 MED ORDER — GLIPIZIDE 10 MG PO TABS: 20.0000 mg | ORAL_TABLET | Freq: Two times a day (BID) | ORAL | 1 refills | Status: DC

## 2017-01-04 MED ORDER — FLUTICASONE PROPIONATE 50 MCG/ACT NA SUSP: 2.0000 | Freq: Every day | NASAL | 5 refills | Status: DC

## 2017-01-04 MED ORDER — METFORMIN HCL 1000 MG PO TABS: 1000.0000 mg | ORAL_TABLET | Freq: Two times a day (BID) | ORAL | 3 refills | Status: DC

## 2017-01-04 NOTE — Telephone Encounter (Signed)
PATIENT CALLED TO SAY SHE NO LONGER USES [facility]. [facility] TO [facility] [address] IN [place].  BEST PHONE IF QUESTIONS [phone] (CELL) MBC

## 2017-01-04 NOTE — Telephone Encounter (Signed)
Left message this Morning about Rx request to give [place] a call back id=f she is not using the [facility] and we will resend Rx.

## 2017-01-04 NOTE — Telephone Encounter (Signed)
Meds ordered this encounter  Medications  . metFORMIN (GLUCOPHAGE) 1000 MG tablet    Sig: Take 1 tablet (1,000 mg total) by mouth 2 (two) times daily with a meal.    Dispense:  180 tablet    Refill:  3    Order Specific Question:   Supervising Provider    Answer:   SHAW, EVA N [[id number]]  . glipiZIDE (GLUCOTROL) 10 MG tablet    Sig: Take 2 tablets (20 mg total) by mouth 2 (two) times daily before a meal.    Dispense:  360 tablet    Refill:  1    Order Specific Question:   Supervising Provider    Answer:   SHAW, EVA N [[id number]]  . fluticasone (FLONASE) 50 MCG/ACT nasal spray    Sig: Place 2 sprays into both nostrils at bedtime.    Dispense:  16 g    Refill:  5    Order Specific Question:   Supervising Provider    Answer:   Clelia Croft, EVA N [[id number]]

## 2017-01-06 ENCOUNTER — Telehealth: Payer: Self-pay | Admitting: Physician Assistant

## 2017-01-06 DIAGNOSIS — Z1231 Encounter for screening mammogram for malignant neoplasm of breast: Secondary | ICD-10-CM

## 2017-01-06 NOTE — Telephone Encounter (Signed)
Pt called requesting order for mammogram to be done at [facility]. Please advise. Thanks!

## 2017-01-08 NOTE — Telephone Encounter (Signed)
Patient notified via My Chart.  Orders Placed This Encounter  Procedures  . MM DIGITAL SCREENING BILATERAL    Standing Status:   Future    Standing Expiration Date:   03/10/2018    Order Specific Question:   Reason for Exam (SYMPTOM  OR DIAGNOSIS REQUIRED)    Answer:   screening for breast cancer    Order Specific Question:   Is the patient pregnant?    Answer:   No    Order Specific Question:   Preferred imaging location?    Answer:   [facility]

## 2017-01-11 ENCOUNTER — Encounter: Payer: Self-pay | Admitting: Physician Assistant

## 2017-01-12 MED ORDER — ROSUVASTATIN CALCIUM 20 MG PO TABS: 20.0000 mg | ORAL_TABLET | Freq: Every day | ORAL | 3 refills | Status: DC

## 2017-01-20 ENCOUNTER — Other Ambulatory Visit: Payer: Self-pay | Admitting: Physician Assistant

## 2017-01-20 DIAGNOSIS — E034 Atrophy of thyroid (acquired): Secondary | ICD-10-CM

## 2017-01-23 ENCOUNTER — Other Ambulatory Visit: Payer: Self-pay | Admitting: Physician Assistant

## 2017-01-23 DIAGNOSIS — E034 Atrophy of thyroid (acquired): Secondary | ICD-10-CM

## 2017-01-27 ENCOUNTER — Other Ambulatory Visit: Payer: Self-pay | Admitting: Physician Assistant

## 2017-01-27 DIAGNOSIS — Z1231 Encounter for screening mammogram for malignant neoplasm of breast: Secondary | ICD-10-CM

## 2017-02-05 ENCOUNTER — Ambulatory Visit
Admission: RE | Admit: 2017-02-05 | Discharge: 2017-02-05 | Disposition: A | Discharge status: Home / Self Care | Payer: 59 | Source: Ambulatory Visit | Attending: Physician Assistant | Admitting: Physician Assistant

## 2017-02-05 DIAGNOSIS — Z1231 Encounter for screening mammogram for malignant neoplasm of breast: Secondary | ICD-10-CM

## 2017-02-10 ENCOUNTER — Other Ambulatory Visit: Payer: Self-pay | Admitting: Physician Assistant

## 2017-02-10 DIAGNOSIS — R928 Other abnormal and inconclusive findings on diagnostic imaging of breast: Secondary | ICD-10-CM

## 2017-02-24 ENCOUNTER — Ambulatory Visit
Admission: RE | Admit: 2017-02-24 | Discharge: 2017-02-24 | Disposition: A | Discharge status: Home / Self Care | Payer: 59 | Source: Ambulatory Visit | Attending: Physician Assistant | Admitting: Physician Assistant

## 2017-02-24 ENCOUNTER — Ambulatory Visit: Payer: 59

## 2017-02-24 DIAGNOSIS — R928 Other abnormal and inconclusive findings on diagnostic imaging of breast: Secondary | ICD-10-CM

## 2017-03-12 ENCOUNTER — Ambulatory Visit (INDEPENDENT_AMBULATORY_CARE_PROVIDER_SITE_OTHER): Payer: 59 | Admitting: Physician Assistant

## 2017-03-12 ENCOUNTER — Encounter: Payer: Self-pay | Admitting: Physician Assistant

## 2017-03-12 VITALS — BP 120/76 | HR 71 | Temp 99.1°F | Resp 18 | Ht 66.0 in | Wt 215.4 lb

## 2017-03-12 DIAGNOSIS — E785 Hyperlipidemia, unspecified: Secondary | ICD-10-CM | POA: Diagnosis not present

## 2017-03-12 DIAGNOSIS — E669 Obesity, unspecified: Secondary | ICD-10-CM

## 2017-03-12 DIAGNOSIS — Z23 Encounter for immunization: Secondary | ICD-10-CM | POA: Diagnosis not present

## 2017-03-12 DIAGNOSIS — E034 Atrophy of thyroid (acquired): Secondary | ICD-10-CM | POA: Diagnosis not present

## 2017-03-12 DIAGNOSIS — R945 Abnormal results of liver function studies: Secondary | ICD-10-CM | POA: Diagnosis not present

## 2017-03-12 DIAGNOSIS — R7989 Other specified abnormal findings of blood chemistry: Secondary | ICD-10-CM

## 2017-03-12 DIAGNOSIS — E118 Type 2 diabetes mellitus with unspecified complications: Secondary | ICD-10-CM

## 2017-03-12 NOTE — Patient Instructions (Signed)
     IF you received an x-ray today, you will receive an invoice from [facility]. Please contact [facility] at [phone] with questions or concerns regarding your invoice.   IF you received labwork today, you will receive an invoice from [facility]. Please contact [facility] at [phone] with questions or concerns regarding your invoice.   Our billing staff will not be able to assist you with questions regarding bills from these companies.  You will be contacted with the lab results as soon as they are available. The fastest way to get your results is to activate your My Chart account. Instructions are located on the last page of this paperwork. If you have not heard from us regarding the results in 2 weeks, please contact this office.     

## 2017-03-12 NOTE — Progress Notes (Signed)
Patient ID: Alejandra Ray, female    DOB: 21-Aug-1960, 56 y.o.   MRN: [id number]  PCP: Porfirio Oar, PA-C  Chief Complaint  Patient presents with  . Diabetes  . Follow-up    Subjective:   Presents for evaluation of diabetes.  I last saw her in August, after 6 months without health insurance. She had just started a new healthy eating plan and become much more active. She had not started the pravastatin prescribed at her previous visit.  A1C had improved from 10.3 in 06/2016 to 7.0% in 12/2016. LDL 120 AST 71 ALT 102  Rosuvastatin was recommended, with plan to re-evaluate with labs today. She has only been taking it weekly, and then ran out in September. She has been taking Fish Oil 2 g BID. Prefers to take as few medications as possible.  She has reduced metformin from 1000 mg BID to 500 mg BID, due to some muscle cramping in the legs when she first started it.   Review of Systems Denies chest pain, shortness of breath, HA, dizziness, vision change, nausea, vomiting, diarrhea, constipation, melena, hematochezia, dysuria, increased urinary urgency or frequency, increased hunger or thirst, unintentional weight change, unexplained myalgias or arthralgias, rash.   Patient Active Problem List   Diagnosis Date Noted  . Obesity (BMI 30-39.9) 06/18/2016  . OSA on CPAP 06/18/2014  . UARS (upper airway resistance syndrome) 06/18/2014  . Snoring 03/15/2014  . Stress headaches 03/15/2014  . Hypothyroidism 01/24/2014  . Screening for breast cancer 08/23/2013  . Perimenopausal 08/03/2011  . SUI (stress urinary incontinence, female) 08/03/2011  . Allergic rhinitis 08/03/2011  . 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 RLS (restless legs syndrome) 08/03/2011  . Type 2 diabetes mellitus with complication, without long-term current use of insulin (HCC)      Prior to Admission medications   Medication Sig Start Date End Date Taking? Authorizing Provider  aspirin 81 MG tablet Take 81 mg  by mouth daily.   Yes [provider]  cyclobenzaprine (FLEXERIL) 10 MG tablet TAKE 1 TABLET BY MOUTH AT BEDTIME AS NEEDED 06/29/16  Yes Kavish Lafitte, PA-C  fish oil-omega-3 fatty acids 1000 MG capsule Take 2 g by mouth daily.   Yes [provider]  fluticasone (FLONASE) 50 MCG/ACT nasal spray Place 2 sprays into both nostrils at bedtime. 01/04/17  Yes Loui Massenburg, PA-C  glipiZIDE (GLUCOTROL) 10 MG tablet Take 2 tablets (20 mg total) by mouth 2 (two) times daily before a meal. 01/04/17  Yes Alanee Ting, PA-C  glucose blood test strip Use to check blood sugars daily for type 2 diabetes - Dispense device compatible with glucometer 12/10/16  Yes Porfirio Oar, PA-C  Lancet Devices (AUTO-LANCET) MISC Use to check blood sugars daily for type 2 diabetes - Dispense device compatible with glucometer 04/22/16  Yes Tobey Grim, MD  levothyroxine (SYNTHROID, LEVOTHROID) 50 MCG tablet TAKE 1 TABLET BY MOUTH EVERY DAY 01/24/17  Yes Karder Goodin, PA-C  metFORMIN (GLUCOPHAGE) 1000 MG tablet Take 1 tablet (1,000 mg total) by mouth 2 (two) times daily with a meal. 01/04/17  Yes Lataria Courser, PA-C  milk thistle 175 MG tablet Take 175 mg by mouth daily.   Yes [provider]  Misc Natural Products (RED CLOVER COMBINATION PO) Take 200 mg by mouth daily.   Yes [provider]  Multiple Vitamin (MULTIVITAMIN) capsule Take 1 capsule by mouth daily.   Yes [provider]  rosuvastatin (CRESTOR) 20 MG tablet Take 1 tablet (20  mg total) by mouth daily. 01/12/17  Yes Felita Bump, PA-C  valACYclovir (VALTREX) 1000 MG tablet Take 1 tablet (1,000 mg total) by mouth 2 (two) times daily. 12/09/16  Yes Maniah Nading, PA-C  vitamin B-12 (CYANOCOBALAMIN) 500 MCG tablet Take 500 mcg by mouth daily.   Yes [provider]  scopolamine (TRANSDERM-SCOP, 1.5 MG,) 1 MG/3DAYS Place 1 patch (1.5 mg total) onto the skin every 3 (three) days. Patient not taking: Reported  on 12/09/2016 10/30/15   Morrell Riddle, PA-C     Allergies  Allergen Reactions  . Codeine     Blacked out  . Other     PLASTICS  . Sulfa Antibiotics Nausea And Vomiting       Objective:  Physical Exam  Constitutional: She is oriented to person, place, and time. She appears well-developed and well-nourished. She is active and cooperative. No distress.  BP 120/76 (BP Location: Left Arm, Patient Position: Sitting, Cuff Size: Large)   Pulse 71   Temp 99.1 F (37.3 C) (Oral)   Resp 18   Ht 5\' 6"  (1.676 m)   Wt 215 lb 6.4 oz (97.7 kg)   LMP 08/31/2012   SpO2 97%   BMI 34.77 kg/m   HENT:  Head: Normocephalic and atraumatic.  Right Ear: Hearing normal.  Left Ear: Hearing normal.  Eyes: Conjunctivae are normal. No scleral icterus.  Neck: Normal range of motion. Neck supple. No thyromegaly present.  Cardiovascular: Normal rate, regular rhythm and normal heart sounds.   Pulses:      Radial pulses are 2+ on the right side, and 2+ on the left side.  Pulmonary/Chest: Effort normal and breath sounds normal.  Lymphadenopathy:       Head (right side): No tonsillar, no preauricular, no posterior auricular and no occipital adenopathy present.       Head (left side): No tonsillar, no preauricular, no posterior auricular and no occipital adenopathy present.    She has no cervical adenopathy.       Right: No supraclavicular adenopathy present.       Left: No supraclavicular adenopathy present.  Neurological: She is alert and oriented to person, place, and time. No sensory deficit.  Skin: Skin is warm, dry and intact. No rash noted. No cyanosis or erythema. Nails show no clubbing.  Psychiatric: She has a normal mood and affect. Her speech is normal and behavior is normal.    Wt Readings from Last 3 Encounters:  03/12/17 215 lb 6.4 oz (97.7 kg)  12/09/16 222 lb 3.2 oz (100.8 kg)  06/29/16 224 lb 9.6 oz (101.9 kg)       Assessment & Plan:   Problem List Items Addressed This Visit     Type 2 diabetes mellitus with complication, without long-term current use of insulin (HCC)    She has lost some weight through lifestyle changes, and so her reduced metformin dose may not be an issue. Will advise increase if A1C >7%.      Relevant Orders   Comprehensive metabolic panel   Hemoglobin A1c   Lipid panel   Hypothyroidism    Has been stable. Update lab.      Relevant Orders   TSH   Obesity (BMI 30-39.9)    9 lb weight loss since 2/18. Continue increased physical activity and healthy eating choices.      Hyperlipidemia    Only taking rosuvastatin once weekly, but increased fish oil to 2 g BID. Healthy lifestyle changes may have improved the lipid  profile. Await labs. Adjust regimen as indicated by results.      Relevant Orders   Comprehensive metabolic panel   Lipid panel    Other Visit Diagnoses    Flu vaccine need    -  Primary   Relevant Orders   Flu Vaccine QUAD 36+ mos IM (Completed)   Elevated LFTs       Relevant Orders   Comprehensive metabolic panel       Return in about 3 months (around 06/12/2017) for re-evalaution of diabetes, blood pressure, cholesterol, etc.   Fernande Brashelle S. Eshan Trupiano, PA-C [facility] at [facility]

## 2017-03-12 NOTE — Assessment & Plan Note (Signed)
She has lost some weight through lifestyle changes, and so her reduced metformin dose may not be an issue. Will advise increase if A1C >7%.

## 2017-03-12 NOTE — Progress Notes (Signed)
Subjective:    Patient ID: Alejandra Ray, female    DOB: 27-Nov-1960, 56 y.o.   MRN: [id number]  HPI   [name] is a 56 year old Caucasian female with a past medical history significant for type 2 diabetes mellitus and hypothyroidism who presents today for diabetes follow-up. [name] last Hemoglobin A1c was 7% on 12/09/16.  [name] reports everything is going well. She reports she is not taking her Metformin every day. [name] states she is only taking Metformin 500mg  2 times per day instead of 1,000mg  2 times per day. She states when she started taking the 1,000mg  noticed cramping and pain in her right thigh. She states the "muscle became hard". [name] reports she checks her blood glucose levels every morning and they normally range from 150s-160s. [name] also reports she is only taking the Rosuvastatin 1 time per week. She also states she has not taken it since September.  Patient states she is now taking 2g of fish oil 2 times per day. She states she would like to take as little medication as possible.   [name] denies headaches. She also denies vision changes. Her last optometry appointment was January 2018. She denies dizziness or lightheadedness.  [name] states she wakes up in the middle of the night with numbness and tingling in both of her arms. She states she is going to see a chiropractor and a massage therapist, because she thinks it has to do with her neck and lack of support from her pillow. [name] states she will check her feet in the shower.  She denies changes in her urinary frequency or hematuria. She also denies nausea, vomiting, constipation, and diarrhea.  [name] reports she will have fruit and eggs for breakfast, vegetables, fruits and cheese for lunch and well-balanced dinners most of the time. She states once a week she will have pizza. [name] states she gets exercise daily at work. She will work 10 hour shifts. [name] states she will have 3-4  drinks once per week. She denies current use of tobacco products, but smoked for 26 years. [name] reports she will smoke marijuana once every 2 weeks when she is stressed is at work.   Medications:  Prior to Admission medications   Medication Sig Start Date End Date Taking? Authorizing Provider  aspirin 81 MG tablet Take 81 mg by mouth daily.   Yes [provider]  cyclobenzaprine (FLEXERIL) 10 MG tablet TAKE 1 TABLET BY MOUTH AT BEDTIME AS NEEDED 06/29/16  Yes Jeffery, Chelle, PA-C  fish oil-omega-3 fatty acids 1000 MG capsule Take 2 g by mouth daily.   Yes [provider]  fluticasone (FLONASE) 50 MCG/ACT nasal spray Place 2 sprays into both nostrils at bedtime. 01/04/17  Yes Jeffery, Chelle, PA-C  glipiZIDE (GLUCOTROL) 10 MG tablet Take 2 tablets (20 mg total) by mouth 2 (two) times daily before a meal. 01/04/17  Yes Jeffery, Chelle, PA-C  glucose blood test strip Use to check blood sugars daily for type 2 diabetes - Dispense device compatible with glucometer 12/10/16  Yes Porfirio Oar, PA-C  Lancet Devices (AUTO-LANCET) MISC Use to check blood sugars daily for type 2 diabetes - Dispense device compatible with glucometer 04/22/16  Yes Tobey Grim, MD  levothyroxine (SYNTHROID, LEVOTHROID) 50 MCG tablet TAKE 1 TABLET BY MOUTH EVERY DAY 01/24/17  Yes Jeffery, Chelle, PA-C  metFORMIN (GLUCOPHAGE) 1000 MG tablet Take 1 tablet (1,000 mg total) by mouth  2 (two) times daily with a meal. 01/04/17  Yes Jeffery, Chelle, PA-C  milk thistle 175 MG tablet Take 175 mg by mouth daily.   Yes [provider]  Misc Natural Products (RED CLOVER COMBINATION PO) Take 200 mg by mouth daily.   Yes [provider]  Multiple Vitamin (MULTIVITAMIN) capsule Take 1 capsule by mouth daily.   Yes [provider]  rosuvastatin (CRESTOR) 20 MG tablet Take 1 tablet (20 mg total) by mouth daily. 01/12/17  Yes Jeffery, Chelle, PA-C  valACYclovir (VALTREX) 1000 MG tablet Take 1  tablet (1,000 mg total) by mouth 2 (two) times daily. 12/09/16  Yes Jeffery, Chelle, PA-C  vitamin B-12 (CYANOCOBALAMIN) 500 MCG tablet Take 500 mcg by mouth daily.   Yes [provider]  scopolamine (TRANSDERM-SCOP, 1.5 MG,) 1 MG/3DAYS Place 1 patch (1.5 mg total) onto the skin every 3 (three) days. Patient not taking: Reported on 12/09/2016 10/30/15   Morrell RiddleWeber, Sarah L, PA-C   Allergies: Allergies  Allergen Reactions  . Codeine     Blacked out  . Other     PLASTICS  . Sulfa Antibiotics Nausea And Vomiting    Chronic Medical Conditions:  Patient Active Problem List   Diagnosis Date Noted  . Obesity (BMI 30-39.9) 06/18/2016  . OSA on CPAP 06/18/2014  . UARS (upper airway resistance syndrome) 06/18/2014  . Snoring 03/15/2014  . Stress headaches 03/15/2014  . Hypothyroidism 01/24/2014  . Screening for breast cancer 08/23/2013  . Perimenopausal 08/03/2011  . SUI (stress urinary incontinence, female) 08/03/2011  . Allergic rhinitis 08/03/2011  . 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 RLS (restless legs syndrome) 08/03/2011  . Type 2 diabetes mellitus with complication, without long-term current use of insulin (HCC)    Review of Systems     Objective:   Physical Exam  Constitutional: She appears well-developed and well-nourished. She is active and cooperative.  BP 120/76 (BP Location: Left Arm, Patient Position: Sitting, Cuff Size: Large)   Pulse 71   Temp 99.1 F (37.3 C) (Oral)   Resp 18   Ht 5\' 6"  (1.676 m)   Wt 215 lb 6.4 oz (97.7 kg)   LMP 08/31/2012   SpO2 97%   BMI 34.77 kg/m    HENT:  Head: Normocephalic.    Eyes: Pupils are equal, round, and reactive to light. Conjunctivae are normal.  Neck: Normal range of motion. Neck supple. No thyromegaly present.  Cardiovascular: Normal rate, regular rhythm, normal heart sounds and intact distal pulses.   No murmur heard. Pulses:      Radial pulses are 2+ on the right side, and 2+ on the left side.       Dorsalis pedis  pulses are 2+ on the right side, and 2+ on the left side.       Posterior tibial pulses are 2+ on the right side, and 2+ on the left side.  Pulmonary/Chest: Effort normal and breath sounds normal.  Lymphadenopathy:    She has no cervical adenopathy.  Neurological: She is alert.  Skin: Skin is warm and dry.      Assessment & Plan:  1. Type 2 Diabetes Mellitus - Hemoglobin A1c obtained today in clinic - CMP obtained today in clinic  - Lipid panel obtained today in clinic - Continue Metformin 500mg  BID, and depending on Hemoglobin A1c today will discuss trying to increase Metformin dose - Continue Rosuvastatin 20mg  daily - Encouraged patient to attempt to eat well-balanced meals that include fruits and vegetables, whole grains,  lean proteins, and low sodium foods. Also encouraged patient to get at least 30 minutes of exercise 5 times per week.  - Also encouraged patient to check blood glucose levels at home.  - Return to clinic in 3 months for diabetes follow-up.   2. Hypothyroidism  - TSH obtained today in clinic  - Continue Levothyroxine daily   Audreanna Torrisi, PA-S

## 2017-03-12 NOTE — Assessment & Plan Note (Signed)
Has been stable. Update lab. 

## 2017-03-12 NOTE — Assessment & Plan Note (Signed)
Only taking rosuvastatin once weekly, but increased fish oil to 2 g BID. Healthy lifestyle changes may have improved the lipid profile. Await labs. Adjust regimen as indicated by results.

## 2017-03-12 NOTE — Assessment & Plan Note (Signed)
9 lb weight loss since 2/18. Continue increased physical activity and healthy eating choices.

## 2017-03-13 LAB — COMPREHENSIVE METABOLIC PANEL
ALBUMIN: 4.8 g/dL (ref 3.5–5.5)
ALT: 57 IU/L — ABNORMAL HIGH (ref 0–32)
AST: 40 IU/L (ref 0–40)
Albumin/Globulin Ratio: 1.7 (ref 1.2–2.2)
Alkaline Phosphatase: 43 IU/L (ref 39–117)
BILIRUBIN TOTAL: 0.4 mg/dL (ref 0.0–1.2)
BUN / CREAT RATIO: 23 (ref 9–23)
BUN: 14 mg/dL (ref 6–24)
CHLORIDE: 100 mmol/L (ref 96–106)
CO2: 22 mmol/L (ref 20–29)
Calcium: 9.5 mg/dL (ref 8.7–10.2)
Creatinine, Ser: 0.6 mg/dL (ref 0.57–1.00)
GFR calc Af Amer: 116 mL/min/{1.73_m2} (ref >59)
GFR calc non Af Amer: 101 mL/min/{1.73_m2} (ref >59)
GLOBULIN, TOTAL: 2.8 g/dL (ref 1.5–4.5)
Glucose: 131 mg/dL — ABNORMAL HIGH (ref 65–99)
POTASSIUM: 4.3 mmol/L (ref 3.5–5.2)
SODIUM: 138 mmol/L (ref 134–144)
Total Protein: 7.6 g/dL (ref 6.0–8.5)

## 2017-03-13 LAB — LIPID PANEL
CHOL/HDL RATIO: 3.4 ratio (ref 0.0–4.4)
Cholesterol, Total: 171 mg/dL (ref 100–199)
HDL: 50 mg/dL (ref >39)
LDL Calculated: 104 mg/dL — ABNORMAL HIGH (ref 0–99)
TRIGLYCERIDES: 84 mg/dL (ref 0–149)
VLDL Cholesterol Cal: 17 mg/dL (ref 5–40)

## 2017-03-13 LAB — TSH: TSH: 3.16 u[IU]/mL (ref 0.450–4.500)

## 2017-03-13 LAB — HEMOGLOBIN A1C
Est. average glucose Bld gHb Est-mCnc: 143 mg/dL
HEMOGLOBIN A1C: 6.6 % — AB (ref 4.8–5.6)

## 2017-05-28 ENCOUNTER — Telehealth: Payer: Self-pay | Admitting: Physician Assistant

## 2017-05-28 NOTE — Telephone Encounter (Signed)
Copied from CRM [phone]. Topic: Quick Communication - See Telephone Encounter >> May 28, 2017  9:46 AM Arlyss Gandyichardson, Marin Milley N, NT wrote: CRM for notification. See Telephone encounter for: [facility] [address] [facility] for this pt for the One Touch glucose blood test strip. CB#: [phone]  05/28/17.

## 2017-05-31 ENCOUNTER — Other Ambulatory Visit: Payer: Self-pay | Admitting: *Deleted

## 2017-05-31 DIAGNOSIS — E119 Type 2 diabetes mellitus without complications: Secondary | ICD-10-CM

## 2017-05-31 MED ORDER — GLUCOSE BLOOD VI STRP: ORAL_STRIP | 99 refills | Status: DC

## 2017-06-01 ENCOUNTER — Telehealth: Payer: Self-pay

## 2017-06-01 NOTE — Telephone Encounter (Signed)
Error-Sign Encounter 

## 2017-06-08 ENCOUNTER — Telehealth: Payer: Self-pay

## 2017-06-08 MED ORDER — MICROLET LANCETS MISC: 3 refills | Status: AC

## 2017-06-09 NOTE — Telephone Encounter (Signed)
One touch is what she needs. She went to the pharmacy and the wrong test strips were sent in.

## 2017-06-10 ENCOUNTER — Other Ambulatory Visit: Payer: Self-pay

## 2017-06-10 ENCOUNTER — Telehealth: Payer: Self-pay

## 2017-06-10 DIAGNOSIS — E119 Type 2 diabetes mellitus without complications: Secondary | ICD-10-CM

## 2017-06-10 NOTE — Telephone Encounter (Signed)
Alejandra Ray,  Received fax [id number]:patient's request for one touch ultra blue test strips.  They are being denied with a request to change to [employer]ayer/Ascensia as the preferred.   I tried to call patient to advise but there was no answer. Garnet Overfield

## 2017-06-10 NOTE — Telephone Encounter (Signed)
Spoke with pharmacy.  They were trying to give pt what she wanted, One Touch and per pharm tech, pt was greatly upset, walked out of pharmacy.  Pharm Tech took order for One Touch verbally (order in computer also covered which one fits her device) and they will " make it good for her"   They will call pt back and take care of order.

## 2017-06-11 MED ORDER — GLUCOSE BLOOD VI STRP: ORAL_STRIP | 99 refills | Status: AC

## 2017-06-11 MED ORDER — GLUCOSE BLOOD VI STRP
ORAL_STRIP | 99 refills | Status: DC
Start: 2017-06-11 — End: 2017-06-11

## 2017-06-11 NOTE — Telephone Encounter (Signed)
Rx sent electronically.  Meds ordered this encounter  Medications  . glucose blood test strip    Sig: Use to check blood sugars daily for type 2 diabetes    Dispense:  100 each    Refill:  prn    Bayer/Ascensia strips per patient's insurance    Order Specific Question:   Supervising Provider    Answer:   Clelia CroftSHAW, EVA N [[id number]]

## 2017-06-11 NOTE — Addendum Note (Signed)
Addended by: Fernande BrasJEFFERY, Sadye Kiernan S on: 06/11/2017 06:48 PM   Modules accepted: Orders

## 2017-06-18 ENCOUNTER — Encounter: Payer: Self-pay | Admitting: Physician Assistant

## 2017-06-18 ENCOUNTER — Ambulatory Visit: Payer: BLUE CROSS/BLUE SHIELD | Admitting: Physician Assistant

## 2017-06-18 ENCOUNTER — Other Ambulatory Visit: Payer: Self-pay

## 2017-06-18 VITALS — BP 120/80 | HR 69 | Temp 98.3°F | Resp 18 | Ht 66.0 in | Wt 221.8 lb

## 2017-06-18 DIAGNOSIS — E034 Atrophy of thyroid (acquired): Secondary | ICD-10-CM | POA: Diagnosis not present

## 2017-06-18 DIAGNOSIS — E118 Type 2 diabetes mellitus with unspecified complications: Secondary | ICD-10-CM

## 2017-06-18 DIAGNOSIS — E785 Hyperlipidemia, unspecified: Secondary | ICD-10-CM

## 2017-06-18 DIAGNOSIS — L989 Disorder of the skin and subcutaneous tissue, unspecified: Secondary | ICD-10-CM | POA: Diagnosis not present

## 2017-06-18 DIAGNOSIS — E669 Obesity, unspecified: Secondary | ICD-10-CM

## 2017-06-18 DIAGNOSIS — L918 Other hypertrophic disorders of the skin: Secondary | ICD-10-CM | POA: Diagnosis not present

## 2017-06-18 LAB — HM DIABETES EYE EXAM

## 2017-06-18 NOTE — Assessment & Plan Note (Signed)
Continue healthy lifestyle changes.

## 2017-06-18 NOTE — Patient Instructions (Addendum)
Call your insurance plan and find out which glucometer they cover, etc.    IF you received an x-ray today, you will receive an invoice from [facility]. Please contact [facility] at [phone] with questions or concerns regarding your invoice.   IF you received labwork today, you will receive an invoice from [facility]. Please contact [facility] at [phone] with questions or concerns regarding your invoice.   Our billing staff will not be able to assist you with questions regarding bills from these companies.  You will be contacted with the lab results as soon as they are available. The fastest way to get your results is to activate your My Chart account. Instructions are located on the last page of this paperwork. If you have not heard from us regarding the results in 2 weeks, please contact this office.

## 2017-06-18 NOTE — Assessment & Plan Note (Signed)
Await labs. Adjust regimen as indicated by results.  

## 2017-06-18 NOTE — Assessment & Plan Note (Signed)
Has been controlled. Continue healthy lifestyle changes. Contact her pharmacy benefit manager regarding the glucometer and test strips. Continue metformin and glipizide.

## 2017-06-18 NOTE — Progress Notes (Signed)
Patient ID: Alejandra Ray, female    DOB: Jul 20, 1960, 57 y.o.   MRN: [id number]  PCP: Porfirio Oar, PA-C  Chief Complaint  Patient presents with  . Diabetes    Pt states she hasn't been able to test sugars for about a week because called in lancets and strips, and meter were wrong. Pt states she will not pay for a new meter.  . Hypertension  . Hyperlipidemia  . Follow-up    Subjective:   Presents for evaluation of diabetes.  She is having trouble getting test strips for her glucometer. Apparently her new insurance has a different preferred product, and she doesn't want to purchase a new glucometer.  Last labs 03/2017: LDL 104 TSH normal A1C 6.6%, glucose 131 Normal renal and hepatic function tests.  Sees eye specialist today, [facility]. Sees sleep medicine next week.  Review of Systems  Constitutional: Negative for activity change, appetite change, fatigue and unexpected weight change.  HENT: Negative for congestion, dental problem, ear pain, hearing loss, mouth sores, postnasal drip, rhinorrhea, sneezing, sore throat, tinnitus and trouble swallowing.   Eyes: Negative for photophobia, pain, redness and visual disturbance.  Respiratory: Negative for cough, chest tightness and shortness of breath.   Cardiovascular: Negative for chest pain, palpitations and leg swelling.  Gastrointestinal: Negative for abdominal pain, blood in stool, constipation, diarrhea, nausea and vomiting.  Endocrine: Negative for cold intolerance, heat intolerance, polydipsia, polyphagia and polyuria.  Genitourinary: Negative for dysuria, frequency, hematuria and urgency.  Musculoskeletal: Negative for arthralgias, gait problem, myalgias and neck stiffness.  Skin: Negative for rash.       Has some skin tags on her neck that catch on jewelry and clothing, and a lesion on the upper LEFT thigh that she's concerned about. Has previously seen dermatology.  Neurological: Negative for dizziness,  speech difficulty, weakness, light-headedness, numbness and headaches.  Hematological: Negative for adenopathy.  Psychiatric/Behavioral: Negative for confusion and sleep disturbance. The patient is not nervous/anxious.        Patient Active Problem List   Diagnosis Date Noted  . Hyperlipidemia 03/12/2017  . Obesity (BMI 30-39.9) 06/18/2016  . OSA on CPAP 06/18/2014  . UARS (upper airway resistance syndrome) 06/18/2014  . Snoring 03/15/2014  . Stress headaches 03/15/2014  . Hypothyroidism 01/24/2014  . Screening for breast cancer 08/23/2013  . Perimenopausal 08/03/2011  . SUI (stress urinary incontinence, female) 08/03/2011  . Allergic rhinitis 08/03/2011  . 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 RLS (restless legs syndrome) 08/03/2011  . Type 2 diabetes mellitus with complication, without long-term current use of insulin (HCC)      Prior to Admission medications   Medication Sig Start Date End Date Taking? Authorizing Provider  aspirin 81 MG tablet Take 81 mg by mouth daily.   Yes [provider]  cyclobenzaprine (FLEXERIL) 10 MG tablet TAKE 1 TABLET BY MOUTH AT BEDTIME AS NEEDED 06/29/16  Yes Gene Colee, PA-C  fish oil-omega-3 fatty acids 1000 MG capsule Take 2 g by mouth daily.   Yes [provider]  fluticasone (FLONASE) 50 MCG/ACT nasal spray Place 2 sprays into both nostrils at bedtime. 01/04/17  Yes Eria Lozoya, PA-C  glipiZIDE (GLUCOTROL) 10 MG tablet Take 2 tablets (20 mg total) by mouth 2 (two) times daily before a meal. 01/04/17  Yes Pinkie Manger, PA-C  glucose blood test strip Use to check blood sugars daily for type 2 diabetes 06/11/17  Yes Porfirio Oar, PA-C  Lancet Devices (AUTO-LANCET) MISC Use to check  blood sugars daily for type 2 diabetes - Dispense device compatible with glucometer 04/22/16  Yes Tobey Grim, MD  levothyroxine (SYNTHROID, LEVOTHROID) 50 MCG tablet TAKE 1 TABLET BY MOUTH EVERY DAY 01/24/17  Yes Nachum Derossett, PA-C    metFORMIN (GLUCOPHAGE) 1000 MG tablet Take 1 tablet (1,000 mg total) by mouth 2 (two) times daily with a meal. 01/04/17  Yes Grainne Knights, PA-C  MICROLET LANCETS MISC Use to check blood sugars daily for type 2 diabetes 06/08/17  Yes Honey Zakarian, PA-C  milk thistle 175 MG tablet Take 175 mg by mouth daily.   Yes [provider]  Misc Natural Products (RED CLOVER COMBINATION PO) Take 200 mg by mouth daily.   Yes [provider]  Multiple Vitamin (MULTIVITAMIN) capsule Take 1 capsule by mouth daily.   Yes [provider]  rosuvastatin (CRESTOR) 20 MG tablet Take 1 tablet (20 mg total) by mouth daily. 01/12/17  Yes Chelsia Serres, PA-C  valACYclovir (VALTREX) 1000 MG tablet Take 1 tablet (1,000 mg total) by mouth 2 (two) times daily. 12/09/16  Yes Reiley Keisler, PA-C  vitamin B-12 (CYANOCOBALAMIN) 500 MCG tablet Take 500 mcg by mouth daily.   Yes [provider]     Allergies  Allergen Reactions  . Codeine     Blacked out  . Other     PLASTICS  . Sulfa Antibiotics Nausea And Vomiting       Objective:  Physical Exam  Constitutional: She is oriented to person, place, and time. She appears well-developed and well-nourished. She is active and cooperative. No distress.  BP 120/80 (BP Location: Right Arm, Patient Position: Sitting, Cuff Size: Large)   Pulse 69   Temp 98.3 F (36.8 C) (Oral)   Resp 18   Ht 5\' 6"  (1.676 m)   Wt 221 lb 12.8 oz (100.6 kg)   LMP 08/31/2012   SpO2 97%   BMI 35.80 kg/m   HENT:  Head: Normocephalic and atraumatic.  Right Ear: Hearing normal.  Left Ear: Hearing normal.  Eyes: Conjunctivae are normal. No scleral icterus.  Neck: Normal range of motion. Neck supple. No thyromegaly present.  Cardiovascular: Normal rate, regular rhythm and normal heart sounds.  Pulses:      Radial pulses are 2+ on the right side, and 2+ on the left side.  Pulmonary/Chest: Effort normal and breath sounds normal.  Lymphadenopathy:        Head (right side): No tonsillar, no preauricular, no posterior auricular and no occipital adenopathy present.       Head (left side): No tonsillar, no preauricular, no posterior auricular and no occipital adenopathy present.    She has no cervical adenopathy.       Right: No supraclavicular adenopathy present.       Left: No supraclavicular adenopathy present.  Neurological: She is alert and oriented to person, place, and time. No sensory deficit.  Skin: Skin is warm, dry and intact. No rash noted. No cyanosis or erythema. Nails show no clubbing.     Psychiatric: She has a normal mood and affect. Her speech is normal and behavior is normal.    Wt Readings from Last 3 Encounters:  06/18/17 221 lb 12.8 oz (100.6 kg)  03/12/17 215 lb 6.4 oz (97.7 kg)  12/09/16 222 lb 3.2 oz (100.8 kg)          Assessment & Plan:   Problem List Items Addressed This Visit    Type 2 diabetes mellitus with complication, without long-term current  use of insulin (HCC) - Primary    Has been controlled. Continue healthy lifestyle changes. Contact her pharmacy benefit manager regarding the glucometer and test strips. Continue metformin and glipizide.      Relevant Orders   Comprehensive metabolic panel   Hemoglobin A1c   HM DIABETES EYE EXAM (Completed)   Hypothyroidism    Has been well controlled. Continue levothyroxine 50 mcg daily.      Relevant Orders   TSH   T4, free   Obesity (BMI 30-39.9)    Continue healthy lifestyle changes.      Hyperlipidemia    Await labs. Adjust regimen as indicated by results.       Relevant Orders   Comprehensive metabolic panel   Lipid panel    Other Visit Diagnoses    Skin lesion of left leg       Cutaneous skin tags           Return in about 3 months (around 09/15/2017) for for re-evaluation of diabetes, cholesterol, etc. Schedule skin lesion excision at your convenience.Fernande Bras.   Cystal Shannahan S. Willadeen Colantuono, PA-C [facility] Care at [facility]

## 2017-06-18 NOTE — Assessment & Plan Note (Signed)
Has been well controlled. Continue levothyroxine 50 mcg daily.

## 2017-06-19 ENCOUNTER — Encounter: Payer: Self-pay | Admitting: Neurology

## 2017-06-19 LAB — COMPREHENSIVE METABOLIC PANEL
ALBUMIN: 5 g/dL (ref 3.5–5.5)
ALT: 42 IU/L — AB (ref 0–32)
AST: 25 IU/L (ref 0–40)
Albumin/Globulin Ratio: 1.9 (ref 1.2–2.2)
Alkaline Phosphatase: 46 IU/L (ref 39–117)
BILIRUBIN TOTAL: 0.4 mg/dL (ref 0.0–1.2)
BUN/Creatinine Ratio: 24 — ABNORMAL HIGH (ref 9–23)
BUN: 14 mg/dL (ref 6–24)
CHLORIDE: 105 mmol/L (ref 96–106)
CO2: 20 mmol/L (ref 20–29)
CREATININE: 0.59 mg/dL (ref 0.57–1.00)
Calcium: 10 mg/dL (ref 8.7–10.2)
GFR calc Af Amer: 116 mL/min/{1.73_m2} (ref >59)
GFR calc non Af Amer: 101 mL/min/{1.73_m2} (ref >59)
GLUCOSE: 161 mg/dL — AB (ref 65–99)
Globulin, Total: 2.7 g/dL (ref 1.5–4.5)
Potassium: 4.8 mmol/L (ref 3.5–5.2)
Sodium: 144 mmol/L (ref 134–144)
Total Protein: 7.7 g/dL (ref 6.0–8.5)

## 2017-06-19 LAB — LIPID PANEL
CHOLESTEROL TOTAL: 195 mg/dL (ref 100–199)
Chol/HDL Ratio: 3.7 ratio (ref 0.0–4.4)
HDL: 53 mg/dL (ref >39)
LDL Calculated: 122 mg/dL — ABNORMAL HIGH (ref 0–99)
TRIGLYCERIDES: 99 mg/dL (ref 0–149)
VLDL Cholesterol Cal: 20 mg/dL (ref 5–40)

## 2017-06-19 LAB — HEMOGLOBIN A1C
Est. average glucose Bld gHb Est-mCnc: 134 mg/dL
HEMOGLOBIN A1C: 6.3 % — AB (ref 4.8–5.6)

## 2017-06-19 LAB — TSH: TSH: 4.25 u[IU]/mL (ref 0.450–4.500)

## 2017-06-19 LAB — T4, FREE: Free T4: 1.75 ng/dL (ref 0.82–1.77)

## 2017-06-21 ENCOUNTER — Ambulatory Visit: Payer: BLUE CROSS/BLUE SHIELD | Admitting: Adult Health

## 2017-06-21 ENCOUNTER — Encounter: Payer: Self-pay | Admitting: Adult Health

## 2017-06-21 VITALS — BP 136/83 | HR 81 | Ht 66.0 in | Wt 227.2 lb

## 2017-06-21 DIAGNOSIS — Z9989 Dependence on other enabling machines and devices: Secondary | ICD-10-CM | POA: Diagnosis not present

## 2017-06-21 DIAGNOSIS — G4733 Obstructive sleep apnea (adult) (pediatric): Secondary | ICD-10-CM

## 2017-06-21 NOTE — Patient Instructions (Signed)
Your Plan:  Continue using CPAP nightly If your symptoms worsen or you develop new symptoms please let us know.   Thank you for coming to see us at [facility]. I hope we have been able to provide you high quality care today.  You may receive a patient satisfaction survey over the next few weeks. We would appreciate your feedback and comments so that we may continue to improve ourselves and the health of our patients.  

## 2017-06-21 NOTE — Progress Notes (Addendum)
PATIENT: Alejandra Ray DOB: 03/21/1961  REASON FOR VISIT: follow up HISTORY FROM: patient  HISTORY OF PRESENT ILLNESS: Today 06/21/17 Alejandra Ray is a 57 year old female with a history of obstructive sleep apnea on CPAP.  She returns today for compliance download.  Her download indicates that she use her machine 30 out of 30 days for compliance of 100%.  She used her machine greater than 4 hours each night.  On average she uses her machine 7 hours and 51 minutes.  Her residual AHI is 1.5 on minimum pressure of 5 cm of water and maximum pressure of 12 cm of water with EPR of 2.  She does not have a leak with her mask.  Overall she reports that she is doing well.  She reports that her Epworth sleepiness score is slightly elevated but this is due to her job.  She reports that her job is very strenuous.  She returns today for an evaluation.  HISTORY History from the eighth of February  2018,  Alejandra Ray is here for her yearly compliance visit which has been 100% over the last 30 days with average user time 8 hours and 20 minutes, she is using AutoSet CPAP between 5 and 12 cm water with a full-time expiratory pressure relief of 2 cm water and a residual AHI of 2.2 minimal air leaks using a nasal pillow, she has recovered from her in contact dermatitis that she first displayed, using just Vaseline to protect her skin. The 95th percentile pressure is 8.6 cm water.   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 She has found herself unemployed for 4 months now. She has started to sleep more as she doesn't have to get up early in the morning, and there is probably some component of depression  REVIEW OF SYSTEMS: Out of a complete 14 system review of symptoms, the patient complains only of the following symptoms, and all other reviewed systems are negative.   See  HPI  ALLERGIES: Allergies  Allergen Reactions  . Codeine     Blacked out  . Other     PLASTICS  . Sulfa Antibiotics Nausea And Vomiting    HOME MEDICATIONS: Outpatient Medications Prior to Visit  Medication Sig Dispense Refill  . aspirin 81 MG tablet Take 81 mg by mouth daily.    . cyclobenzaprine (FLEXERIL) 10 MG tablet TAKE 1 TABLET BY MOUTH AT BEDTIME AS NEEDED 90 tablet 0  . fish oil-omega-3 fatty acids 1000 MG capsule Take 2 g by mouth daily.    . fluticasone (FLONASE) 50 MCG/ACT nasal spray Place 2 sprays into both nostrils at bedtime. 16 g 5  . glipiZIDE (GLUCOTROL) 10 MG tablet Take 2 tablets (20 mg total) by mouth 2 (two) times daily before a meal. 360 tablet 1  . glucose blood test strip Use to check blood sugars daily for type 2 diabetes 100 each prn  . Lancet Devices (AUTO-LANCET) MISC Use to check blood sugars daily for type 2 diabetes - Dispense device compatible with glucometer 100 each 6  . levothyroxine (SYNTHROID, LEVOTHROID) 50 MCG tablet TAKE 1 TABLET BY MOUTH EVERY DAY 90 tablet 3  . metFORMIN (GLUCOPHAGE) 1000 MG tablet Take 1 tablet (1,000 mg total) by mouth 2 (two) times daily with a meal. 180 tablet 3  . MICROLET LANCETS MISC Use to check blood sugars daily for type 2  diabetes 100 each 3  . milk thistle 175 MG tablet Take 175 mg by mouth daily.    . Misc Natural Products (RED CLOVER COMBINATION PO) Take 200 mg by mouth daily.    . Multiple Vitamin (MULTIVITAMIN) capsule Take 1 capsule by mouth daily.    . rosuvastatin (CRESTOR) 20 MG tablet Take 1 tablet (20 mg total) by mouth daily. 90 tablet 3  . scopolamine (TRANSDERM-SCOP, 1.5 MG,) 1 MG/3DAYS Place 1 patch (1.5 mg total) onto the skin every 3 (three) days. 10 patch 0  . valACYclovir (VALTREX) 1000 MG tablet Take 1 tablet (1,000 mg total) by mouth 2 (two) times daily. 10 tablet prn  . vitamin B-12 (CYANOCOBALAMIN) 500 MCG tablet Take 500 mcg by mouth daily.     No facility-administered medications prior to  visit.     PAST MEDICAL HISTORY: Past Medical History:  Diagnosis Date  . Allergic rhinitis   . Diabetes mellitus   . Hyperlipidemia   . SUI (stress urinary incontinence, female)   . Type 2 diabetes mellitus (HCC)     PAST SURGICAL HISTORY: Past Surgical History:  Procedure Laterality Date  . CHOLECYSTECTOMY    . tublation  2001    FAMILY HISTORY: Family History  Problem Relation Age of Onset  . Diabetes Mother   . Hyperlipidemia Mother   . Cancer Father   . Hypertension Sister   . Hypertension Sister   . Hypertension Sister   . Hypertension Sister     SOCIAL HISTORY: Social History   Socioeconomic History  . Marital status: Married    Spouse name: Zeb Comfort  . Number of children: 1  . Years of education: College  . Highest education level: Not on file  Social Needs  . Financial resource strain: Not on file  . Food insecurity - worry: Not on file  . Food insecurity - inability: Not on file  . Transportation needs - medical: Not on file  . Transportation needs - non-medical: Not on file  Occupational History  . Occupation: PRODUCTION    Comment: [employer] ([employer])  Tobacco Use  . Smoking status: Former Smoker    Last attempt to quit: 05/13/1996    Years since quitting: 21.1  . Smokeless tobacco: Never Used  Substance and Sexual Activity  . Alcohol use: Yes    Comment: Grey goose or beer. 3 drinks/wk  . Drug use: Yes    Types: Marijuana    Comment: occassionally  . Sexual activity: No  Other Topics Concern  . Not on file  Social History Narrative   Patient is married [name]) and lives at home with her husband.   Patient has one adult son who lives in [place].   Husband has 2 daughters.   Patient is right-handed.   Patient does not drink any caffeine.    Education: some college. Exercise: No      PHYSICAL EXAM  Vitals:   06/21/17 0900  BP: 136/83  Pulse: 81  Weight: 227 lb 3.2 oz (103.1 kg)  Height: 5\' 6"  (1.676 m)   Body mass  index is 36.67 kg/m.  Generalized: Well developed, in no acute distress  Chest: Lungs clear to auscultation  Neurological examination  Mentation: Alert oriented to time, place, history taking. Follows all commands speech and language fluent Cranial nerve II-XII: Pupils were equal round reactive to light. Extraocular movements were full, visual field were full on confrontational test. Facial sensation and strength were normal. Uvula tongue midline. Head turning  and shoulder shrug  were normal and symmetric. Motor: The motor testing reveals 5 over 5 strength of all 4 extremities. Good symmetric motor tone is noted throughout.  Sensory: Sensory testing is intact to soft touch on all 4 extremities. No evidence of extinction is noted.  Coordination: Cerebellar testing reveals good finger-nose-finger and heel-to-shin bilaterally.  Gait and station: Gait is normal.    DIAGNOSTIC DATA (LABS, IMAGING, TESTING) - I reviewed patient records, labs, notes, testing and imaging myself where available.  Lab Results  Component Value Date   WBC 6.6 12/31/2014   HGB 13.7 12/31/2014   HCT 41.7 12/31/2014   MCV 86.6 12/31/2014   PLT 275 02/29/2012      Component Value Date/Time   NA 144 06/18/2017 0821   K 4.8 06/18/2017 0821   CL 105 06/18/2017 0821   CO2 20 06/18/2017 0821   GLUCOSE 161 (H) 06/18/2017 0821   GLUCOSE 358 (H) 03/18/2016 0907   BUN 14 06/18/2017 0821   CREATININE 0.59 06/18/2017 0821   CREATININE 0.66 03/18/2016 0907   CALCIUM 10.0 06/18/2017 0821   PROT 7.7 06/18/2017 0821   ALBUMIN 5.0 06/18/2017 0821   AST 25 06/18/2017 0821   ALT 42 (H) 06/18/2017 0821   ALKPHOS 46 06/18/2017 0821   BILITOT 0.4 06/18/2017 0821   GFRNONAA 101 06/18/2017 0821   GFRNONAA >89 01/24/2014 1042   GFRAA 116 06/18/2017 0821   GFRAA >89 01/24/2014 1042   Lab Results  Component Value Date   CHOL 195 06/18/2017   HDL 53 06/18/2017   LDLCALC 122 (H) 06/18/2017   LDLDIRECT 105 (H) 03/24/2012    TRIG 99 06/18/2017   CHOLHDL 3.7 06/18/2017   Lab Results  Component Value Date   HGBA1C 6.3 (H) 06/18/2017   Lab Results  Component Value Date   VITAMINB12 >2000 (H) 01/24/2015   Lab Results  Component Value Date   TSH 4.250 06/18/2017      ASSESSMENT AND PLAN 57 y.o. year old female  has a past medical history of Allergic rhinitis, Diabetes mellitus, Hyperlipidemia, SUI (stress urinary incontinence, female), and Type 2 diabetes mellitus (HCC). here with:  1.  Obstructive sleep apnea on CPAP  Overall the patient is doing well.  She is encouraged to continue using the CPAP nightly and for greater than 4 hours each night.  She is advised that if her symptoms worsen or she develops new symptoms she should let us know.  She will follow-up in 6 months or sooner if needed.  I spent 15 minutes with the patient. 50% of this time was spent Reviewing CPAP download    Butch Penny, MSN, NP-C 06/21/2017, 9:10 AM [facility] [address], [address] [place], [place] [address] [phone]  I reviewed the above note and documentation by the Nurse Practitioner and agree with the history, physical exam, assessment and plan as outlined above. I was immediately available for face-to-face consultation. Huston Foley, MD, PhD [facility])

## 2017-06-25 ENCOUNTER — Encounter: Payer: Self-pay | Admitting: Physician Assistant

## 2017-06-25 ENCOUNTER — Ambulatory Visit: Payer: BLUE CROSS/BLUE SHIELD | Admitting: Physician Assistant

## 2017-06-25 VITALS — BP 112/74 | HR 78 | Temp 98.9°F | Resp 18 | Ht 66.0 in | Wt 224.0 lb

## 2017-06-25 DIAGNOSIS — L918 Other hypertrophic disorders of the skin: Secondary | ICD-10-CM

## 2017-06-25 NOTE — Progress Notes (Signed)
Chief Complaint  Patient presents with  . Skin tag removal    Various parts of body    History of Present Illness: Patient presents for removal of multiple skin tags  Multiple very small skin tags around the base of the neck and breast area.  A single larger skin tag on the upper inner left thigh.  These were evaluated at her visit with me earlier this month.  They rub against her clothing and jewelry, causing pain.  Episodically irritated by this restriction.  Her work environment is very warm, her clothing is typically saturated with sweat, which further irritates these lesions.  She desires removal by excision.   Allergies  Allergen Reactions  . Codeine     Blacked out  . Other     PLASTICS  . Sulfa Antibiotics Nausea And Vomiting    Prior to Admission medications   Medication Sig Start Date End Date Taking? Authorizing Provider  aspirin 81 MG tablet Take 81 mg by mouth daily.   Yes [provider]  cyclobenzaprine (FLEXERIL) 10 MG tablet TAKE 1 TABLET BY MOUTH AT BEDTIME AS NEEDED 06/29/16  Yes Kristiann Noyce, PA-C  fish oil-omega-3 fatty acids 1000 MG capsule Take 2 g by mouth daily.   Yes [provider]  fluticasone (FLONASE) 50 MCG/ACT nasal spray Place 2 sprays into both nostrils at bedtime. 01/04/17  Yes Marqus Macphee, PA-C  glipiZIDE (GLUCOTROL) 10 MG tablet Take 2 tablets (20 mg total) by mouth 2 (two) times daily before a meal. 01/04/17  Yes Breydan Shillingburg, PA-C  glucose blood test strip Use to check blood sugars daily for type 2 diabetes 06/11/17  Yes Porfirio Oar, PA-C  Lancet Devices (AUTO-LANCET) MISC Use to check blood sugars daily for type 2 diabetes - Dispense device compatible with glucometer 04/22/16  Yes Tobey Grim, MD  levothyroxine (SYNTHROID, LEVOTHROID) 50 MCG tablet TAKE 1 TABLET BY MOUTH EVERY DAY 01/24/17  Yes Arabia Nylund, PA-C  metFORMIN (GLUCOPHAGE) 1000 MG tablet Take 1 tablet (1,000 mg total) by mouth 2 (two)  times daily with a meal. 01/04/17  Yes Jasmina Gendron, PA-C  MICROLET LANCETS MISC Use to check blood sugars daily for type 2 diabetes 06/08/17  Yes Hassell Patras, PA-C  milk thistle 175 MG tablet Take 175 mg by mouth daily.   Yes [provider]  Misc Natural Products (RED CLOVER COMBINATION PO) Take 200 mg by mouth daily.   Yes [provider]  Multiple Vitamin (MULTIVITAMIN) capsule Take 1 capsule by mouth daily.   Yes [provider]  rosuvastatin (CRESTOR) 20 MG tablet Take 1 tablet (20 mg total) by mouth daily. 01/12/17  Yes Ricardo Schubach, PA-C  scopolamine (TRANSDERM-SCOP, 1.5 MG,) 1 MG/3DAYS Place 1 patch (1.5 mg total) onto the skin every 3 (three) days. 10/30/15  Yes Weber, Dema Severin, PA-C  valACYclovir (VALTREX) 1000 MG tablet Take 1 tablet (1,000 mg total) by mouth 2 (two) times daily. 12/09/16  Yes Eloina Ergle, PA-C  vitamin B-12 (CYANOCOBALAMIN) 500 MCG tablet Take 500 mcg by mouth daily.   Yes [provider]    Patient Active Problem List   Diagnosis Date Noted  . Hyperlipidemia 03/12/2017  . Obesity (BMI 30-39.9) 06/18/2016  . OSA on CPAP 06/18/2014  . UARS (upper airway resistance syndrome) 06/18/2014  . Snoring 03/15/2014  . Stress headaches 03/15/2014  . Hypothyroidism 01/24/2014  . Screening for breast cancer 08/23/2013  . Perimenopausal 08/03/2011  . SUI (stress urinary incontinence, female)  08/03/2011  . Allergic rhinitis 08/03/2011  . 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 RLS (restless legs syndrome) 08/03/2011  . Type 2 diabetes mellitus with complication, without long-term current use of insulin (HCC)      Physical Exam  Constitutional: She is oriented to person, place, and time. She appears well-developed and well-nourished. She is active and cooperative. No distress.  BP 112/74   Pulse 78   Temp 98.9 F (37.2 C) (Oral)   Resp 18   Ht 5\' 6"  (1.676 m)   Wt 224 lb (101.6 kg)   LMP 08/31/2012   SpO2 98%   BMI 36.15 kg/m      Eyes: Conjunctivae are normal.  Pulmonary/Chest: Effort normal.  Neurological: She is alert and oriented to person, place, and time.  Skin:  Skin tags: #3 anterior chest/midline, #1 base of right neck, #1 base of left neck, #5 left lateral chest wall near the axilla, #2 right arm/bicep area, #1 just anterior to the right axilla, #1 under left breast, #6 under right breast, #1 right upper inner thigh  Psychiatric: She has a normal mood and affect. Her speech is normal and behavior is normal.   Procedure: After verbal consent, each skin tag identified above was cleansed with alcohol prep pad and then anesthetized with 2% lidocaine with epinephrine and removed with scissors.  Spot Band-Aid applied to each site after cleaning.   ASSESSMENT & PLAN:  1. Inflamed skin tags Removal as above.  Local wound care.    Return for re-evaluation 09/17/2017, as planned.   Fernande Brashelle S. Joesph Marcy, PA-C Primary Care at [facility]

## 2017-06-25 NOTE — Progress Notes (Signed)
Subjective:    Patient ID: Alejandra Ray, female    DOB: 04/13/1961, 57 y.o.   MRN: [id number]  HPI Patient in office for removal of skin tags and verrucous lesion.  Patient Active Problem List   Diagnosis Date Noted  . Hyperlipidemia 03/12/2017  . Obesity (BMI 30-39.9) 06/18/2016  . OSA on CPAP 06/18/2014  . UARS (upper airway resistance syndrome) 06/18/2014  . Snoring 03/15/2014  . Stress headaches 03/15/2014  . Hypothyroidism 01/24/2014  . Screening for breast cancer 08/23/2013  . Perimenopausal 08/03/2011  . SUI (stress urinary incontinence, female) 08/03/2011  . Allergic rhinitis 08/03/2011  . 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 RLS (restless legs syndrome) 08/03/2011  . Type 2 diabetes mellitus with complication, without long-term current use of insulin (HCC)    Allergies  Allergen Reactions  . Codeine     Blacked out  . Other     PLASTICS  . Sulfa Antibiotics Nausea And Vomiting   Prior to Admission medications   Medication Sig Start Date End Date Taking? Authorizing Provider  aspirin 81 MG tablet Take 81 mg by mouth daily.   Yes [provider]  cyclobenzaprine (FLEXERIL) 10 MG tablet TAKE 1 TABLET BY MOUTH AT BEDTIME AS NEEDED 06/29/16  Yes Jeffery, Chelle, PA-C  fish oil-omega-3 fatty acids 1000 MG capsule Take 2 g by mouth daily.   Yes [provider]  fluticasone (FLONASE) 50 MCG/ACT nasal spray Place 2 sprays into both nostrils at bedtime. 01/04/17  Yes Jeffery, Chelle, PA-C  glipiZIDE (GLUCOTROL) 10 MG tablet Take 2 tablets (20 mg total) by mouth 2 (two) times daily before a meal. 01/04/17  Yes Jeffery, Chelle, PA-C  glucose blood test strip Use to check blood sugars daily for type 2 diabetes 06/11/17  Yes Porfirio OarJeffery, Chelle, PA-C  Lancet Devices (AUTO-LANCET) MISC Use to check blood sugars daily for type 2 diabetes - Dispense device compatible with glucometer 04/22/16  Yes Tobey GrimWalden, Jeffrey H, MD  levothyroxine (SYNTHROID, LEVOTHROID) 50 MCG tablet TAKE 1  TABLET BY MOUTH EVERY DAY 01/24/17  Yes Jeffery, Chelle, PA-C  metFORMIN (GLUCOPHAGE) 1000 MG tablet Take 1 tablet (1,000 mg total) by mouth 2 (two) times daily with a meal. 01/04/17  Yes Jeffery, Chelle, PA-C  MICROLET LANCETS MISC Use to check blood sugars daily for type 2 diabetes 06/08/17  Yes Jeffery, Chelle, PA-C  milk thistle 175 MG tablet Take 175 mg by mouth daily.   Yes [provider]  Misc Natural Products (RED CLOVER COMBINATION PO) Take 200 mg by mouth daily.   Yes [provider]  Multiple Vitamin (MULTIVITAMIN) capsule Take 1 capsule by mouth daily.   Yes [provider]  rosuvastatin (CRESTOR) 20 MG tablet Take 1 tablet (20 mg total) by mouth daily. 01/12/17  Yes Jeffery, Chelle, PA-C  scopolamine (TRANSDERM-SCOP, 1.5 MG,) 1 MG/3DAYS Place 1 patch (1.5 mg total) onto the skin every 3 (three) days. 10/30/15  Yes Weber, Dema SeverinSarah L, PA-C  valACYclovir (VALTREX) 1000 MG tablet Take 1 tablet (1,000 mg total) by mouth 2 (two) times daily. 12/09/16  Yes Jeffery, Chelle, PA-C  vitamin B-12 (CYANOCOBALAMIN) 500 MCG tablet Take 500 mcg by mouth daily.   Yes [provider]   Past Medical History:  Diagnosis Date  . Allergic rhinitis   . Diabetes mellitus   . Hyperlipidemia   . SUI (stress urinary incontinence, female)   . Type 2 diabetes mellitus (HCC)    Social History   Socioeconomic History  . Marital status:  Married    Spouse name: Zeb Comfort  . Number of children: 1  . Years of education: College  . Highest education level: Not on file  Social Needs  . Financial resource strain: Not on file  . Food insecurity - worry: Not on file  . Food insecurity - inability: Not on file  . Transportation needs - medical: Not on file  . Transportation needs - non-medical: Not on file  Occupational History  . Occupation: PRODUCTION    Comment: [employer] (ma[employer] pads)  Tobacco Use  . Smoking status: Former Smoker    Last attempt to quit: 05/13/1996    Years  since quitting: 21.1  . Smokeless tobacco: Never Used  Substance and Sexual Activity  . Alcohol use: Yes    Comment: Grey goose or beer. 3 drinks/wk  . Drug use: Yes    Types: Marijuana    Comment: occassionally  . Sexual activity: No  Other Topics Concern  . Not on file  Social History Narrative   Patient is married [name]) and lives at home with her husband.   Patient has one adult son who lives in [place].   Husband has 2 daughters.   Patient is right-handed.   Patient does not drink any caffeine.    Education: some college. Exercise: No   Family History  Problem Relation Age of Onset  . Diabetes Mother   . Hyperlipidemia Mother   . Cancer Father   . Hypertension Sister   . Hypertension Sister   . Hypertension Sister   . Hypertension Sister    Past Surgical History:  Procedure Laterality Date  . CHOLECYSTECTOMY    . tublation  2001   Review of Systems  Constitutional: Negative.        BP 112/74   Pulse 78   Temp 98.9 F (37.2 C) (Oral)   Resp 18   Ht 5\' 6"  (1.676 m)   Wt 224 lb (101.6 kg)   LMP 08/31/2012   SpO2 98%   BMI 36.15 kg/m   HENT: Negative.   Eyes: Negative.   Respiratory: Negative.   Cardiovascular: Negative.   Skin: Negative for color change, pallor, rash and wound.       Skin tags and verrucous lesion.       Objective:   Physical Exam  Constitutional: She appears well-developed and well-nourished.  HENT:  Head: Normocephalic and atraumatic.  Right Ear: External ear normal.  Left Ear: External ear normal.  Nose: Nose normal.  Mouth/Throat: Oropharynx is clear and moist.  Eyes: Conjunctivae and EOM are normal. Pupils are equal, round, and reactive to light.  Neck: Normal range of motion. Neck supple.  Cardiovascular: Normal rate, regular rhythm, normal heart sounds and intact distal pulses.  Pulmonary/Chest: Effort normal and breath sounds normal.  Skin: Skin is warm and dry.  Skin tags and verrucous lesions.         Assessment & Plan:   1. Inflamed skin tags Removal of skin tags: Three tags on anterior chest wall, midline of neck. One on the right side of the neck, superior to the right clavicle.One on the left side of neck, superior to the left clavicle. Five skin tags under the left arm (2 posterior to left axilla, and 3 in the left mid-axillary line.) Two under the right arm over the bicep, the other anterior to the right axilla. One skin tag under left breast in mid-clavicular line and 6 skin tags under the right breast (along ribs 4-6  clustered along the mid clavicular line,) were removed. One verrucous lesion on the right upper thigh was removed.  Return for re-evaluation 09/17/2017, as planned.

## 2017-06-25 NOTE — Patient Instructions (Signed)
     IF you received an x-ray today, you will receive an invoice from [facility]. Please contact [facility] at [phone] with questions or concerns regarding your invoice.   IF you received labwork today, you will receive an invoice from [facility]. Please contact [facility] at [phone] with questions or concerns regarding your invoice.   Our billing staff will not be able to assist you with questions regarding bills from these companies.  You will be contacted with the lab results as soon as they are available. The fastest way to get your results is to activate your My Chart account. Instructions are located on the last page of this paperwork. If you have not heard from us regarding the results in 2 weeks, please contact this office.     

## 2017-07-04 ENCOUNTER — Other Ambulatory Visit: Payer: Self-pay | Admitting: Physician Assistant

## 2017-07-04 DIAGNOSIS — E119 Type 2 diabetes mellitus without complications: Secondary | ICD-10-CM

## 2017-08-16 ENCOUNTER — Encounter: Payer: Self-pay | Admitting: Physician Assistant

## 2017-09-17 ENCOUNTER — Ambulatory Visit: Payer: BLUE CROSS/BLUE SHIELD | Admitting: Physician Assistant

## 2017-09-17 ENCOUNTER — Encounter: Payer: Self-pay | Admitting: Physician Assistant

## 2017-09-17 ENCOUNTER — Other Ambulatory Visit: Payer: Self-pay

## 2017-09-17 VITALS — BP 108/70 | HR 72 | Temp 98.7°F | Resp 16 | Ht 66.0 in | Wt 216.0 lb

## 2017-09-17 DIAGNOSIS — E669 Obesity, unspecified: Secondary | ICD-10-CM

## 2017-09-17 DIAGNOSIS — E034 Atrophy of thyroid (acquired): Secondary | ICD-10-CM

## 2017-09-17 DIAGNOSIS — E118 Type 2 diabetes mellitus with unspecified complications: Secondary | ICD-10-CM | POA: Diagnosis not present

## 2017-09-17 DIAGNOSIS — E785 Hyperlipidemia, unspecified: Secondary | ICD-10-CM

## 2017-09-17 NOTE — Assessment & Plan Note (Signed)
Has continued to lose weight with healthier lifestyle. Continue.

## 2017-09-17 NOTE — Assessment & Plan Note (Signed)
Has been well controlled. No changes.

## 2017-09-17 NOTE — Assessment & Plan Note (Signed)
LDL remains above goal of <16, but anticipate continued improvement with lifestyle changes. Consider Increase rosuvastatin from 20 mg to 40 mg.

## 2017-09-17 NOTE — Progress Notes (Signed)
Patient ID: Alejandra Ray, female    DOB: 03/29/1961, 57 y.o.   MRN: [id number]  PCP: Porfirio Oar, PA-C  Chief Complaint  Patient presents with  . Diabetes    follow up     Subjective:   Presents for evaluation of Diabetes.  Has been watching her eating very carefully. Had one episode of hypoglycemia, 44. Ate a spoonful of peanut butter, with improvement.  When she doesn't eat much, she reduces the evening dose of metformin from 1000 mg to 500 mg. She chose this to reduce, rather than the glipizide, due to previous adverse effects of metformin (intermittent "wooden" feeling in the 5th toes, cramping in the lower legs).  Last labs 06/2016. A1C 6.3% LDL 122, TG 99. HDL 53 TSH and free T4 were normal.   Review of Systems  Constitutional: Negative for activity change, appetite change, fatigue and unexpected weight change.  HENT: Negative for congestion, dental problem, ear pain, hearing loss, mouth sores, postnasal drip, rhinorrhea, sneezing, sore throat, tinnitus and trouble swallowing.   Eyes: Negative for photophobia, pain, redness and visual disturbance.  Respiratory: Negative for cough, chest tightness and shortness of breath.   Cardiovascular: Negative for chest pain, palpitations and leg swelling.  Gastrointestinal: Negative for abdominal pain, blood in stool, constipation, diarrhea, nausea and vomiting.  Endocrine: Negative for cold intolerance, heat intolerance, polydipsia, polyphagia and polyuria.  Genitourinary: Negative for dysuria, frequency, hematuria and urgency.  Musculoskeletal: Negative for arthralgias, gait problem, myalgias and neck stiffness.  Skin: Negative for rash.  Neurological: Negative for dizziness, speech difficulty, weakness, light-headedness, numbness and headaches.  Hematological: Negative for adenopathy.  Psychiatric/Behavioral: Negative for confusion and sleep disturbance. The patient is not nervous/anxious.        Patient Active  Problem List   Diagnosis Date Noted  . Hyperlipidemia 03/12/2017  . Obesity (BMI 30-39.9) 06/18/2016  . OSA on CPAP 06/18/2014  . UARS (upper airway resistance syndrome) 06/18/2014  . Snoring 03/15/2014  . Stress headaches 03/15/2014  . Hypothyroidism 01/24/2014  . Screening for breast cancer 08/23/2013  . Perimenopausal 08/03/2011  . SUI (stress urinary incontinence, female) 08/03/2011  . Allergic rhinitis 08/03/2011  . 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 RLS (restless legs syndrome) 08/03/2011  . Type 2 diabetes mellitus with complication, without long-term current use of insulin (HCC)      Prior to Admission medications   Medication Sig Start Date End Date Taking? Authorizing Provider  aspirin 81 MG tablet Take 81 mg by mouth daily.   Yes [provider]  cyclobenzaprine (FLEXERIL) 10 MG tablet TAKE 1 TABLET BY MOUTH AT BEDTIME AS NEEDED 06/29/16  Yes Ragen Laver, PA-C  fish oil-omega-3 fatty acids 1000 MG capsule Take 2 g by mouth daily.   Yes [provider]  fluticasone (FLONASE) 50 MCG/ACT nasal spray Place 2 sprays into both nostrils at bedtime. 01/04/17  Yes Mazikeen Hehn, PA-C  glipiZIDE (GLUCOTROL) 10 MG tablet TAKE 2 TABLETS (20 MG TOTAL) BY MOUTH 2 (TWO) TIMES DAILY BEFORE A MEAL. 07/05/17  Yes Amdrew Oboyle, PA-C  glucose blood test strip Use to check blood sugars daily for type 2 diabetes 06/11/17  Yes Porfirio Oar, PA-C  Lancet Devices (AUTO-LANCET) MISC Use to check blood sugars daily for type 2 diabetes - Dispense device compatible with glucometer 04/22/16  Yes Tobey Grim, MD  levothyroxine (SYNTHROID, LEVOTHROID) 50 MCG tablet TAKE 1 TABLET BY MOUTH EVERY DAY 01/24/17  Yes Kymberli Wiegand, PA-C  metFORMIN (GLUCOPHAGE) 1000 MG tablet  Take 1 tablet (1,000 mg total) by mouth 2 (two) times daily with a meal. 01/04/17  Yes Amyla Heffner, PA-C  MICROLET LANCETS MISC Use to check blood sugars daily for type 2 diabetes 06/08/17  Yes Jemell Town,  PA-C  milk thistle 175 MG tablet Take 175 mg by mouth daily.   Yes [provider]  Misc Natural Products (RED CLOVER COMBINATION PO) Take 200 mg by mouth daily.   Yes [provider]  Multiple Vitamin (MULTIVITAMIN) capsule Take 1 capsule by mouth daily.   Yes [provider]  rosuvastatin (CRESTOR) 20 MG tablet Take 1 tablet (20 mg total) by mouth daily. 01/12/17  Yes Shanica Castellanos, PA-C  scopolamine (TRANSDERM-SCOP, 1.5 MG,) 1 MG/3DAYS Place 1 patch (1.5 mg total) onto the skin every 3 (three) days. 10/30/15  Yes Weber, Dema Severin, PA-C  valACYclovir (VALTREX) 1000 MG tablet Take 1 tablet (1,000 mg total) by mouth 2 (two) times daily. 12/09/16  Yes Kabella Cassidy, PA-C  vitamin B-12 (CYANOCOBALAMIN) 500 MCG tablet Take 500 mcg by mouth daily.   Yes [provider]     Allergies  Allergen Reactions  . Codeine     Blacked out  . Other     PLASTICS  . Sulfa Antibiotics Nausea And Vomiting       Objective:  Physical Exam  Constitutional: She is oriented to person, place, and time. She appears well-developed and well-nourished. She is active and cooperative. No distress.  BP 108/70   Pulse 72   Temp 98.7 F (37.1 C)   Resp 16   Ht  (1.676 m)   Wt 216 lb (98 kg)   LMP 08/31/2012   SpO2 97%   BMI 34.86 kg/m   HENT:  Head: Normocephalic and atraumatic.  Right Ear: Hearing normal.  Left Ear: Hearing normal.  Eyes: Conjunctivae are normal. No scleral icterus.  Neck: Normal range of motion. Neck supple. No thyromegaly present.  Cardiovascular: Normal rate, regular rhythm and normal heart sounds.  Pulses:      Radial pulses are 2+ on the right side, and 2+ on the left side.  Pulmonary/Chest: Effort normal and breath sounds normal.  Lymphadenopathy:       Head (right side): No tonsillar, no preauricular, no posterior auricular and no occipital adenopathy present.       Head (left side): No tonsillar, no preauricular, no posterior auricular and  no occipital adenopathy present.    She has no cervical adenopathy.       Right: No supraclavicular adenopathy present.       Left: No supraclavicular adenopathy present.  Neurological: She is alert and oriented to person, place, and time. No sensory deficit.  Skin: Skin is warm, dry and intact. No rash noted. No cyanosis or erythema. Nails show no clubbing.  Psychiatric: She has a normal mood and affect. Her speech is normal and behavior is normal.   Diabetic Foot Exam - Simple   Simple Foot Form Diabetic Foot exam was performed with the following findings:  Yes 09/17/2017  8:11 AM  Visual Inspection No deformities, no ulcerations, no other skin breakdown bilaterally:  Yes Sensation Testing Intact to touch and monofilament testing bilaterally:  Yes Pulse Check Posterior Tibialis and Dorsalis pulse intact bilaterally:  Yes Comments      Wt Readings from Last 3 Encounters:  09/17/17 216 lb (98 kg)  06/25/17 224 lb (101.6 kg)  06/21/17 227 lb 3.2 oz (103.1 kg)  Assessment & Plan:   Problem List Items Addressed This Visit    Type 2 diabetes mellitus with complication, without long-term current use of insulin (HCC) - Primary    Continues efforts for healthier eating and increased exercise. Due to hypoglycemia, REDUCE glipizide from 20 mg BID to 10 mg BID, and hold dose if glucose <70. Continue metformin 1000 mg BID.      Relevant Orders   Comprehensive metabolic panel   Hemoglobin A1c   Microalbumin / creatinine urine ratio   HM DIABETES FOOT EXAM (Completed)   Hypothyroidism    Has been well controlled. No changes.      Relevant Orders   TSH   Obesity (BMI 30-39.9)    Has continued to lose weight with healthier lifestyle. Continue.      Hyperlipidemia    LDL remains above goal of <40, but anticipate continued improvement with lifestyle changes. Consider Increase rosuvastatin from 20 mg to 40 mg.      Relevant Orders   Comprehensive metabolic panel   Lipid  panel       Return in about 3 months (around 12/18/2017) for re-evaluation of diabetes.   Fernande Bras, PA-C [facility]

## 2017-09-17 NOTE — Patient Instructions (Addendum)
REDUCE the glipizide to 10 mg twice each day. If your glucose is less than 70, HOLD the glipizide, but continue the metformin 1000 mg twice each day.  KEEP up the GREAT work!  IF you received an x-ray today, you will receive an invoice from [facility]. Please contact [facility] at [phone] with questions or concerns regarding your invoice.   IF you received labwork today, you will receive an invoice from [facility]. Please contact [facility] at [phone] with questions or concerns regarding your invoice.   Our billing staff will not be able to assist you with questions regarding bills from these companies.  You will be contacted with the lab results as soon as they are available. The fastest way to get your results is to activate your My Chart account. Instructions are located on the last page of this paperwork. If you have not heard from [place] regarding the results in 2 weeks, please contact this office.

## 2017-09-17 NOTE — Assessment & Plan Note (Signed)
Continues efforts for healthier eating and increased exercise. Due to hypoglycemia, REDUCE glipizide from 20 mg BID to 10 mg BID, and hold dose if glucose <70. Continue metformin 1000 mg BID.

## 2017-09-18 LAB — COMPREHENSIVE METABOLIC PANEL
ALK PHOS: 40 IU/L (ref 39–117)
ALT: 45 IU/L — AB (ref 0–32)
AST: 28 IU/L (ref 0–40)
Albumin/Globulin Ratio: 1.8 (ref 1.2–2.2)
Albumin: 4.7 g/dL (ref 3.5–5.5)
BILIRUBIN TOTAL: 0.4 mg/dL (ref 0.0–1.2)
BUN/Creatinine Ratio: 22 (ref 9–23)
BUN: 13 mg/dL (ref 6–24)
CALCIUM: 9.3 mg/dL (ref 8.7–10.2)
CHLORIDE: 103 mmol/L (ref 96–106)
CO2: 21 mmol/L (ref 20–29)
CREATININE: 0.6 mg/dL (ref 0.57–1.00)
GFR calc Af Amer: 115 mL/min/{1.73_m2} (ref >59)
GFR calc non Af Amer: 100 mL/min/{1.73_m2} (ref >59)
Globulin, Total: 2.6 g/dL (ref 1.5–4.5)
Glucose: 154 mg/dL — ABNORMAL HIGH (ref 65–99)
Potassium: 4.5 mmol/L (ref 3.5–5.2)
Sodium: 139 mmol/L (ref 134–144)
TOTAL PROTEIN: 7.3 g/dL (ref 6.0–8.5)

## 2017-09-18 LAB — LIPID PANEL
Chol/HDL Ratio: 3.3 ratio (ref 0.0–4.4)
Cholesterol, Total: 167 mg/dL (ref 100–199)
HDL: 50 mg/dL (ref >39)
LDL Calculated: 105 mg/dL — ABNORMAL HIGH (ref 0–99)
TRIGLYCERIDES: 61 mg/dL (ref 0–149)
VLDL Cholesterol Cal: 12 mg/dL (ref 5–40)

## 2017-09-18 LAB — MICROALBUMIN / CREATININE URINE RATIO
Creatinine, Urine: 125.5 mg/dL
MICROALBUM., U, RANDOM: 13.6 ug/mL
Microalb/Creat Ratio: 10.8 mg/g creat (ref 0.0–30.0)

## 2017-09-18 LAB — HEMOGLOBIN A1C
ESTIMATED AVERAGE GLUCOSE: 131 mg/dL
HEMOGLOBIN A1C: 6.2 % — AB (ref 4.8–5.6)

## 2017-09-18 LAB — TSH: TSH: 3.54 u[IU]/mL (ref 0.450–4.500)

## 2017-10-21 ENCOUNTER — Encounter (HOSPITAL_COMMUNITY): Payer: Self-pay | Admitting: Emergency Medicine

## 2017-10-21 ENCOUNTER — Emergency Department (HOSPITAL_COMMUNITY): Payer: BLUE CROSS/BLUE SHIELD

## 2017-10-21 ENCOUNTER — Other Ambulatory Visit: Payer: Self-pay

## 2017-10-21 ENCOUNTER — Emergency Department (HOSPITAL_COMMUNITY)
Admission: EM | Admit: 2017-10-21 | Discharge: 2017-10-21 | Disposition: A | Discharge status: Home / Self Care | Payer: BLUE CROSS/BLUE SHIELD | Attending: Emergency Medicine | Admitting: Emergency Medicine

## 2017-10-21 DIAGNOSIS — S76211A Strain of adductor muscle, fascia and tendon of right thigh, initial encounter: Secondary | ICD-10-CM | POA: Insufficient documentation

## 2017-10-21 DIAGNOSIS — Z79899 Other long term (current) drug therapy: Secondary | ICD-10-CM | POA: Insufficient documentation

## 2017-10-21 DIAGNOSIS — E039 Hypothyroidism, unspecified: Secondary | ICD-10-CM | POA: Insufficient documentation

## 2017-10-21 DIAGNOSIS — Y33XXXA Other specified events, undetermined intent, initial encounter: Secondary | ICD-10-CM | POA: Insufficient documentation

## 2017-10-21 DIAGNOSIS — R102 Pelvic and perineal pain: Secondary | ICD-10-CM | POA: Diagnosis present

## 2017-10-21 DIAGNOSIS — Y929 Unspecified place or not applicable: Secondary | ICD-10-CM | POA: Diagnosis not present

## 2017-10-21 DIAGNOSIS — E785 Hyperlipidemia, unspecified: Secondary | ICD-10-CM | POA: Insufficient documentation

## 2017-10-21 DIAGNOSIS — Z87891 Personal history of nicotine dependence: Secondary | ICD-10-CM | POA: Insufficient documentation

## 2017-10-21 DIAGNOSIS — Z7982 Long term (current) use of aspirin: Secondary | ICD-10-CM | POA: Insufficient documentation

## 2017-10-21 DIAGNOSIS — Z7984 Long term (current) use of oral hypoglycemic drugs: Secondary | ICD-10-CM | POA: Diagnosis not present

## 2017-10-21 DIAGNOSIS — Y939 Activity, unspecified: Secondary | ICD-10-CM | POA: Diagnosis not present

## 2017-10-21 DIAGNOSIS — E119 Type 2 diabetes mellitus without complications: Secondary | ICD-10-CM | POA: Insufficient documentation

## 2017-10-21 DIAGNOSIS — T148XXA Other injury of unspecified body region, initial encounter: Secondary | ICD-10-CM

## 2017-10-21 DIAGNOSIS — Y998 Other external cause status: Secondary | ICD-10-CM | POA: Diagnosis not present

## 2017-10-21 LAB — URINALYSIS, ROUTINE W REFLEX MICROSCOPIC
Bilirubin Urine: NEGATIVE
Glucose, UA: NEGATIVE mg/dL
Hgb urine dipstick: NEGATIVE
Ketones, ur: NEGATIVE mg/dL
LEUKOCYTES UA: NEGATIVE
NITRITE: NEGATIVE
Protein, ur: NEGATIVE mg/dL
SPECIFIC GRAVITY, URINE: 1.016 (ref 1.005–1.030)
pH: 5 (ref 5.0–8.0)

## 2017-10-21 MED ORDER — NAPROXEN 500 MG PO TABS: 500.0000 mg | ORAL_TABLET | Freq: Two times a day (BID) | ORAL | 0 refills | Status: DC

## 2017-10-21 MED ORDER — KETOROLAC TROMETHAMINE 30 MG/ML IJ SOLN
30.0000 mg | Freq: Once | INTRAMUSCULAR | Status: AC
  Administered 2017-10-21: 30 mg via INTRAMUSCULAR
  Filled 2017-10-21: qty 1

## 2017-10-21 MED ORDER — CYCLOBENZAPRINE HCL 10 MG PO TABS: 10.0000 mg | ORAL_TABLET | Freq: Two times a day (BID) | ORAL | 0 refills | Status: DC | PRN

## 2017-10-21 NOTE — ED Provider Notes (Signed)
[facility] EMERGENCY DEPARTMENT Provider Note   CSN: [id number] Arrival date & time: 10/21/17  1119     History   Chief Complaint Chief Complaint  Patient presents with  . Groin Pain    HPI Alejandra Ray is a 57 y.o. female with a past medical history of diabetes, hyperlipidemia who presents to ED for evaluation of sudden onset right groin/pelvic pain that began approximately 2 hours prior to arrival.  States that the pain is now radiating to her back.  She had one bowel movement immediately after the pain began.  Denies any prior history of similar symptoms.  She does not take any medicine to help with her pain.  Denies any vaginal complaints, dysuria, hematuria, history of kidney stones, bowel changes, nausea, vomiting, fever, recent injury or fall.  She was not doing anything besides walking when the pain began.  Denies any history of ovarian cyst or ovarian torsion.  She has not had a menstrual period in 5 years.  HPI  Past Medical History:  Diagnosis Date  . Allergic rhinitis   . Diabetes mellitus   . Hyperlipidemia   . SUI (stress urinary incontinence, female)   . Type 2 diabetes mellitus [facility])     Patient Active Problem List   Diagnosis Date Noted  . Hyperlipidemia 03/12/2017  . Obesity (BMI 30-39.9) 06/18/2016  . OSA on CPAP 06/18/2014  . UARS (upper airway resistance syndrome) 06/18/2014  . Snoring 03/15/2014  . Stress headaches 03/15/2014  . Hypothyroidism 01/24/2014  . Screening for breast cancer 08/23/2013  . Perimenopausal 08/03/2011  . SUI (stress urinary incontinence, female) 08/03/2011  . Allergic rhinitis 08/03/2011  . 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 RLS (restless legs syndrome) 08/03/2011  . Type 2 diabetes mellitus with complication, without long-term current use of insulin [facility])     Past Surgical History:  Procedure Laterality Date  . CHOLECYSTECTOMY    . tublation  2001     OB History   None      Home Medications    Prior to  Admission medications   Medication Sig Start Date End Date Taking? Authorizing Provider  aspirin 81 MG tablet Take 81 mg by mouth daily.   Yes [provider]  fish oil-omega-3 fatty acids 1000 MG capsule Take 2 g by mouth daily.   Yes [provider]  fluticasone (FLONASE) 50 MCG/ACT nasal spray Place 2 sprays into both nostrils at bedtime. 01/04/17  Yes Jeffery, Chelle, PA-C  glipiZIDE (GLUCOTROL) 10 MG tablet TAKE 2 TABLETS (20 MG TOTAL) BY MOUTH 2 (TWO) TIMES DAILY BEFORE A MEAL. 07/05/17  Yes Jeffery, Chelle, PA-C  metFORMIN (GLUCOPHAGE) 1000 MG tablet Take 1 tablet (1,000 mg total) by mouth 2 (two) times daily with a meal. 01/04/17  Yes Jeffery, Chelle, PA-C  milk thistle 175 MG tablet Take 175 mg by mouth daily.   Yes [provider]  Misc Natural Products (RED CLOVER COMBINATION PO) Take 200 mg by mouth daily.   Yes [provider]  Multiple Vitamin (MULTIVITAMIN) capsule Take 1 capsule by mouth daily.   Yes [provider]  scopolamine (TRANSDERM-SCOP, 1.5 MG,) 1 MG/3DAYS Place 1 patch (1.5 mg total) onto the skin every 3 (three) days. 10/30/15  Yes Weber, Dema Severin, PA-C  valACYclovir (VALTREX) 1000 MG tablet Take 1 tablet (1,000 mg total) by mouth 2 (two) times daily. 12/09/16  Yes Jeffery, Chelle, PA-C  vitamin B-12 (CYANOCOBALAMIN) 500 MCG tablet Take 500 mcg by mouth daily.   Yes  [provider]  cyclobenzaprine (FLEXERIL) 10 MG tablet Take 1 tablet (10 mg total) by mouth 2 (two) times daily as needed for muscle spasms. 10/21/17   Drako Maese, PA-C  glucose blood test strip Use to check blood sugars daily for type 2 diabetes 06/11/17   Porfirio Oar, PA-C  Lancet Devices (AUTO-LANCET) MISC Use to check blood sugars daily for type 2 diabetes - Dispense device compatible with glucometer 04/22/16   Tobey Grim, MD  levothyroxine (SYNTHROID, LEVOTHROID) 50 MCG tablet TAKE 1 TABLET BY MOUTH EVERY DAY Patient not taking: Reported on  10/21/2017 01/24/17   Porfirio Oar, PA-C  MICROLET LANCETS MISC Use to check blood sugars daily for type 2 diabetes 06/08/17   Porfirio Oar, PA-C  naproxen (NAPROSYN) 500 MG tablet Take 1 tablet (500 mg total) by mouth 2 (two) times daily. 10/21/17   Donel Osowski, PA-C  rosuvastatin (CRESTOR) 20 MG tablet Take 1 tablet (20 mg total) by mouth daily. Patient not taking: Reported on 10/21/2017 01/12/17   Porfirio Oar, PA-C    Family History Family History  Problem Relation Age of Onset  . Diabetes Mother   . Hyperlipidemia Mother   . Cancer Father   . Hypertension Sister   . Hypertension Sister   . Hypertension Sister   . Hypertension Sister     Social History Social History   Tobacco Use  . Smoking status: Former Smoker    Types: Cigarettes    Last attempt to quit: 05/13/1996    Years since quitting: 21.4  . Smokeless tobacco: Never Used  Substance Use Topics  . Alcohol use: Yes    Comment: Grey goose or beer. 3 drinks/wk  . Drug use: Yes    Types: Marijuana    Comment: occassionally     Allergies   Codeine; Other; and Sulfa antibiotics   Review of Systems Review of Systems  Constitutional: Negative for appetite change, chills and fever.  HENT: Negative for ear pain, rhinorrhea, sneezing and sore throat.   Eyes: Negative for photophobia and visual disturbance.  Respiratory: Negative for cough, chest tightness, shortness of breath and wheezing.   Cardiovascular: Negative for chest pain and palpitations.  Gastrointestinal: Negative for abdominal pain, blood in stool, constipation, diarrhea, nausea and vomiting.  Genitourinary: Positive for pelvic pain. Negative for dysuria, hematuria, urgency, vaginal bleeding, vaginal discharge and vaginal pain.  Musculoskeletal: Negative for myalgias.  Skin: Negative for rash.  Neurological: Negative for dizziness, weakness and light-headedness.     Physical Exam Updated Vital Signs BP 124/68   Pulse 61   Temp 98.7 F (37.1  C) (Oral)   Resp 16   Ht 5\' 6"  (1.676 m)   Wt 97.5 kg (215 lb)   LMP 08/31/2012   SpO2 98%   BMI 34.70 kg/m   Physical Exam  Constitutional: She appears well-developed and well-nourished. No distress.  HENT:  Head: Normocephalic and atraumatic.  Nose: Nose normal.  Eyes: Conjunctivae and EOM are normal. Left eye exhibits no discharge. No scleral icterus.  Neck: Normal range of motion. Neck supple.  Cardiovascular: Normal rate, regular rhythm, normal heart sounds and intact distal pulses. Exam reveals no gallop and no friction rub.  No murmur heard. Pulmonary/Chest: Effort normal and breath sounds normal. No respiratory distress.  Abdominal: Soft. Bowel sounds are normal. She exhibits no distension. There is tenderness (R lower). There is no guarding.    Musculoskeletal: Normal range of motion. She exhibits no edema.  Neurological: She is alert. She exhibits  normal muscle tone. Coordination normal.  Skin: Skin is warm and dry. No rash noted.  Psychiatric: She has a normal mood and affect.  Nursing note and vitals reviewed.    ED Treatments / Results  Labs (all labs ordered are listed, but only abnormal results are displayed) Labs Reviewed  URINALYSIS, ROUTINE W REFLEX MICROSCOPIC - Abnormal; Notable for the following components:      Result Value   APPearance HAZY (*)    All other components within normal limits    EKG None  Radiology US Transvaginal Non-ob  Result Date: 10/21/2017 CLINICAL DATA:  Initial evaluation for acute onset right groin pain, extending into the right lower extremity. EXAM: TRANSABDOMINAL AND TRANSVAGINAL ULTRASOUND OF PELVIS DOPPLER ULTRASOUND OF OVARIES TECHNIQUE: Both transabdominal and transvaginal ultrasound examinations of the pelvis were performed. Transabdominal technique was performed for global imaging of the pelvis including uterus, ovaries, adnexal regions, and pelvic cul-de-sac. It was necessary to proceed with endovaginal exam  following the transabdominal exam to visualize the uterus, endometrium, and ovaries. Color and duplex Doppler ultrasound was utilized to evaluate blood flow to the ovaries. COMPARISON:  None. FINDINGS: Uterus Measurements: 7.2 x 3.0 x 4.3 cm. 1.0 x 0.5 x 0.7 cm submucosal fibroid present at the right anterior uterine fundus. Endometrium Thickness: 2.1 mm.  No focal abnormality visualized. Right ovary Measurements: 1.3 x 0.8 x 0.9 cm. Normal appearance/no adnexal mass. Left ovary Measurements: 0.9 x 0.8 x 0.8 cm. Normal appearance/no adnexal mass. Pulsed Doppler evaluation of both ovaries demonstrates normal low-resistance arterial and venous waveforms. Other findings No abnormal free fluid. IMPRESSION: 1. No acute abnormality within the pelvis. No evidence for ovarian torsion. 2. 1 cm uterine fibroid. Electronically Signed   By: Rise Mu M.D.   On: 10/21/2017 13:13   US Pelvis Complete  Result Date: 10/21/2017 CLINICAL DATA:  Initial evaluation for acute onset right groin pain, extending into the right lower extremity. EXAM: TRANSABDOMINAL AND TRANSVAGINAL ULTRASOUND OF PELVIS DOPPLER ULTRASOUND OF OVARIES TECHNIQUE: Both transabdominal and transvaginal ultrasound examinations of the pelvis were performed. Transabdominal technique was performed for global imaging of the pelvis including uterus, ovaries, adnexal regions, and pelvic cul-de-sac. It was necessary to proceed with endovaginal exam following the transabdominal exam to visualize the uterus, endometrium, and ovaries. Color and duplex Doppler ultrasound was utilized to evaluate blood flow to the ovaries. COMPARISON:  None. FINDINGS: Uterus Measurements: 7.2 x 3.0 x 4.3 cm. 1.0 x 0.5 x 0.7 cm submucosal fibroid present at the right anterior uterine fundus. Endometrium Thickness: 2.1 mm.  No focal abnormality visualized. Right ovary Measurements: 1.3 x 0.8 x 0.9 cm. Normal appearance/no adnexal mass. Left ovary Measurements: 0.9 x 0.8 x 0.8 cm.  Normal appearance/no adnexal mass. Pulsed Doppler evaluation of both ovaries demonstrates normal low-resistance arterial and venous waveforms. Other findings No abnormal free fluid. IMPRESSION: 1. No acute abnormality within the pelvis. No evidence for ovarian torsion. 2. 1 cm uterine fibroid. Electronically Signed   By: Rise Mu M.D.   On: 10/21/2017 13:13   [place] Art/ven Flow Abd Pelv Doppler  Result Date: 10/21/2017 CLINICAL DATA:  Initial evaluation for acute onset right groin pain, extending into the right lower extremity. EXAM: TRANSABDOMINAL AND TRANSVAGINAL ULTRASOUND OF PELVIS DOPPLER ULTRASOUND OF OVARIES TECHNIQUE: Both transabdominal and transvaginal ultrasound examinations of the pelvis were performed. Transabdominal technique was performed for global imaging of the pelvis including uterus, ovaries, adnexal regions, and pelvic cul-de-sac. It was necessary to proceed with endovaginal exam following the transabdominal  exam to visualize the uterus, endometrium, and ovaries. Color and duplex Doppler ultrasound was utilized to evaluate blood flow to the ovaries. COMPARISON:  None. FINDINGS: Uterus Measurements: 7.2 x 3.0 x 4.3 cm. 1.0 x 0.5 x 0.7 cm submucosal fibroid present at the right anterior uterine fundus. Endometrium Thickness: 2.1 mm.  No focal abnormality visualized. Right ovary Measurements: 1.3 x 0.8 x 0.9 cm. Normal appearance/no adnexal mass. Left ovary Measurements: 0.9 x 0.8 x 0.8 cm. Normal appearance/no adnexal mass. Pulsed Doppler evaluation of both ovaries demonstrates normal low-resistance arterial and venous waveforms. Other findings No abnormal free fluid. IMPRESSION: 1. No acute abnormality within the pelvis. No evidence for ovarian torsion. 2. 1 cm uterine fibroid. Electronically Signed   By: Rise Mu M.D.   On: 10/21/2017 13:13   Ct Renal Stone Study  Result Date: 10/21/2017 CLINICAL DATA:  RIGHT flank pain radiating to groin. History of diabetes,  cholecystectomy, stress incontinence. EXAM: CT ABDOMEN AND PELVIS WITHOUT CONTRAST TECHNIQUE: Multidetector CT imaging of the abdomen and pelvis was performed following the standard protocol without IV contrast. COMPARISON:  Pelvic ultrasound October 21, 2017 FINDINGS: LOWER CHEST: Dependent atelectasis. The visualized heart size is normal. No pericardial effusion. HEPATOBILIARY: Diffusely hypodense liver compatible with steatosis, borderline hepatomegaly. Focal fatty infiltration segment 5/6. Status post cholecystectomy. PANCREAS: Normal. SPLEEN: Normal. ADRENALS/URINARY TRACT: Kidneys are orthotopic, demonstrating normal size and morphology. No nephrolithiasis, hydronephrosis; limited assessment for renal masses by nonenhanced CT. The unopacified ureters are normal in course and caliber. Urinary bladder is well distended and unremarkable. Normal adrenal glands. STOMACH/BOWEL: The stomach, small and large bowel are normal in course and caliber without inflammatory changes, sensitivity decreased by lack of enteric contrast. Normal appendix. VASCULAR/LYMPHATIC: Aortoiliac vessels are normal in course and caliber. Mild calcific atherosclerosis. No lymphadenopathy by CT size criteria. REPRODUCTIVE: Normal. OTHER: No intraperitoneal free fluid or free air. MUSCULOSKELETAL: Non-acute. Severe T12-L1 and L1-2 spondylosis. Severe lower lumbar facet arthropathy. Small fat containing umbilical hernia. IMPRESSION: 1. No nephrolithiasis, hydronephrosis or acute intra-abdominal/pelvic process. 2. Hepatic steatosis with borderline hepatomegaly. Aortic Atherosclerosis (ICD10-I70.0). Electronically Signed   By: Awilda Metro M.D.   On: 10/21/2017 14:45    Procedures Procedures (including critical care time)  Medications Ordered in ED Medications  ketorolac (TORADOL) 30 MG/ML injection 30 mg (30 mg Intramuscular Given 10/21/17 1344)     Initial Impression / Assessment and Plan / ED Course  I have reviewed the triage  vital signs and the nursing notes.  Pertinent labs & imaging results that were available during my care of the patient were reviewed by me and considered in my medical decision making (see chart for details).     57 year old female with past medical history of diabetes, presents to ED for evaluation of sudden onset right groin/pelvic pain that began approximately 2 hours prior to arrival.  States the pain is now radiating to her back.  No history of similar symptoms in the past.  Denies any vaginal complaints, dysuria, hematuria, history of kidney stones, bowel changes, nausea, vomiting, fever, recent injury or fall.  On physical exam she has right pelvic tenderness to palpation.  Urinalysis with no evidence of infection.  Pelvic ultrasound to rule out torsion was obtained and was negative for acute abnormality.  CT renal stone study returned as negative as well.  Suspect that her symptoms are musculoskeletal in nature.  Will advise muscle relaxer, anti-inflammatories, heat and stretching to help with symptoms.  Advised to follow-up with PCP for further evaluation if  symptoms persist.  Portions of this note were generated with Scientist, clinical (histocompatibility and immunogenetics)Dragon dictation software. Dictation errors may occur despite best attempts at proofreading.   Final Clinical Impressions(s) / ED Diagnoses   Final diagnoses:  Muscle strain    ED Discharge Orders        Ordered    cyclobenzaprine (FLEXERIL) 10 MG tablet  2 times daily PRN     10/21/17 1459    naproxen (NAPROSYN) 500 MG tablet  2 times daily     10/21/17 1459       Dietrich PatesKhatri, Nzinga Ferran, PA-C 10/21/17 1505    Raeford RazorKohut, Stephen, MD 10/22/17 1415

## 2017-10-21 NOTE — ED Notes (Signed)
ED Provider at bedside. 

## 2017-10-21 NOTE — ED Triage Notes (Signed)
Patient c/o right groin pain that started suddenly while walking x1 hour. Per patient diarrhea shortly after. Denies any abd pain, nausea, or vomiting. Patient states she also has pain radiating from her right hip into right upper leg. Denies taking anything for pain.

## 2017-12-09 ENCOUNTER — Encounter: Payer: Self-pay | Admitting: Adult Health

## 2018-06-27 ENCOUNTER — Ambulatory Visit: Payer: BLUE CROSS/BLUE SHIELD | Admitting: Adult Health

## 2018-08-22 ENCOUNTER — Telehealth: Payer: Self-pay | Admitting: *Deleted

## 2018-08-22 NOTE — Telephone Encounter (Signed)
LMVM mobile to return call , re: r/s appt with AL/NP for 09/08/18 at 1430 since MM/NP on maternity leave.  Call to confirm. Also about changing to VV or telephone visit.

## 2018-08-24 NOTE — Telephone Encounter (Signed)
Pt returned call and gave consent for telephone visit  On 08/25/18 at 4:30pm

## 2018-08-24 NOTE — Telephone Encounter (Signed)
Error -- confirm 2:30 08/25/18

## 2018-09-07 NOTE — Telephone Encounter (Signed)
LMVM for pt home, to return call to update chart.

## 2018-09-08 ENCOUNTER — Encounter (INDEPENDENT_AMBULATORY_CARE_PROVIDER_SITE_OTHER): Payer: Self-pay | Admitting: Family Medicine

## 2018-09-08 ENCOUNTER — Telehealth: Payer: Self-pay | Admitting: Family Medicine

## 2018-09-08 ENCOUNTER — Ambulatory Visit: Payer: BLUE CROSS/BLUE SHIELD | Admitting: Adult Health

## 2018-09-08 ENCOUNTER — Other Ambulatory Visit: Payer: Self-pay

## 2018-09-08 DIAGNOSIS — Z9989 Dependence on other enabling machines and devices: Secondary | ICD-10-CM

## 2018-09-08 DIAGNOSIS — G4733 Obstructive sleep apnea (adult) (pediatric): Secondary | ICD-10-CM

## 2018-09-08 DIAGNOSIS — Z0289 Encounter for other administrative examinations: Secondary | ICD-10-CM

## 2018-09-08 NOTE — Progress Notes (Deleted)
PATIENT: Alejandra Ray DOB: 1960-07-24  REASON FOR VISIT: follow up HISTORY FROM: patient  Virtual Visit via Telephone Note  I connected with Alejandra Ray on 09/08/18 at  2:30 PM EDT by telephone and verified that I am speaking with the correct person using two identifiers.   I discussed the limitations, risks, security and privacy concerns of performing an evaluation and management service by telephone and the availability of in person appointments. I also discussed with the patient that there may be a patient responsible charge related to this service. The patient expressed understanding and agreed to proceed.   History of Present Illness:  09/08/18 Alejandra Ray is a 58 y.o. female for follow up of OSA on CPAP.  08/08/2018 - 09/06/2018  09/06/2018 Usage days 30/30 days (100%) >= 4 hours 30 days (100%) < 4 hours 0 days (0%) Usage hours 238 hours 54 minutes Average usage (total days) 7 hours 58 minutes Average usage (days used) 7 hours 58 minutes Median usage (days used) 8 hours 4 minutes Total used hours (value since last reset - 09/06/2018) 11,205 hours AirSense 10 AutoSet Serial number [id number] Mode AutoSet Min Pressure 5 cmH2O Max Pressure 12 cmH2O EPR Fulltime EPR level 2 Therapy Pressure - cmH2O Median: 6.5 95th percentile: 8.7 Maximum: 9.9 Leaks - L/min Median: 0.0 95th percentile: 0.7 Maximum: 5.5 Events per hour AI: 1.2 HI: 0.2 AHI: 1.4 Apnea Index Central: 0.4 Obstructive: 0.8 Unknown: 0.0 RERA Index 0.1 Cheyne-Stokes respiration (average duration per night) 0 minutes (0%)   HISTORY (copied from [employer] note on 06/21/2017)  Alejandra Ray is a 58 year old female with a history of obstructive sleep apnea on CPAP.  She returns today for compliance download.  Her download indicates that she use her machine 30 out of 30 days for compliance of 100%.  She used her machine greater than 4 hours each night.  On average she uses her machine 7 hours  and 51 minutes.  Her residual AHI is 1.5 on minimum pressure of 5 cm of water and maximum pressure of 12 cm of water with EPR of 2.  She does not have a leak with her mask.  Overall she reports that she is doing well.  She reports that her Epworth sleepiness score is slightly elevated but this is due to her job.  She reports that her job is very strenuous.  She returns today for an evaluation.  HISTORY History from the eighth ofFebruary2018, Alejandra Ray is here for her yearly compliance visit which has been 100% over the last 30 days with average user time 8 hours and 20 minutes, she is using AutoSet CPAP between 5 and 12 cm water with a full-time expiratory pressure relief of 2 cm water and a residual AHI of 2.2 minimal air leaks using a nasal pillow, she has recovered from her in contact dermatitis that she first displayed, using just Vaseline to protect her skin. The 95th percentile pressure is 8.6 cm water.   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 She has found herself unemployed for 4 months now. She has started to sleep more as she doesn't have to get up early in the morning, and there is probably some component of depression   Observations/Objective:  Generalized: Well developed, in no acute distress  Mentation: Alert oriented to time, place, history taking. Follows all commands speech and language fluent  Assessment and Plan:  58 y.o. year old female  has a past medical history of Allergic rhinitis, Diabetes mellitus, Hyperlipidemia, SUI (stress urinary incontinence, female), and Type 2 diabetes mellitus (HCC). here with  No diagnosis found.  No orders of the defined types were placed in this encounter.   No orders of the defined types were placed in this encounter.    Follow Up Instructions:  I discussed the assessment and treatment plan with the  patient. The patient was provided an opportunity to ask questions and all were answered. The patient agreed with the plan and demonstrated an understanding of the instructions.   The patient was advised to call back or seek an in-person evaluation if the symptoms worsen or if the condition fails to improve as anticipated.  I provided *** minutes of non-face-to-face time during this encounter.   Shawnie DapperAmy Mychele Seyller, NP

## 2018-09-08 NOTE — Telephone Encounter (Signed)
I have attempted to reach Mrs. Alejandra Ray for her 230 appointment.  I have left voicemails at both home and cell phone locations.  Front desk has also reached out and left messages.  Patient may reschedule should she call back.

## 2018-09-08 NOTE — Telephone Encounter (Signed)
LMVM for pt home, mobile to update chart prior to appt at 230pm Telephone visit.

## 2018-09-12 ENCOUNTER — Ambulatory Visit: Payer: Self-pay | Admitting: Family Medicine

## 2018-09-12 NOTE — Telephone Encounter (Signed)
Pt called in and stated she will have to cancel her appt for 09/13/2018 due to insurance purposes

## 2018-09-13 ENCOUNTER — Ambulatory Visit: Payer: Self-pay | Admitting: Family Medicine

## 2018-12-07 ENCOUNTER — Encounter: Payer: Self-pay | Admitting: Internal Medicine

## 2018-12-12 ENCOUNTER — Telehealth: Payer: Self-pay

## 2018-12-12 NOTE — Telephone Encounter (Signed)
Called patient to do their pre-visit COVID screening.  Call went to voicemail. Unable to do prescreening.  

## 2018-12-13 ENCOUNTER — Other Ambulatory Visit: Payer: Self-pay

## 2018-12-13 ENCOUNTER — Encounter: Payer: Self-pay | Admitting: Internal Medicine

## 2018-12-13 ENCOUNTER — Telehealth: Payer: Self-pay | Admitting: General Practice

## 2018-12-13 ENCOUNTER — Ambulatory Visit (INDEPENDENT_AMBULATORY_CARE_PROVIDER_SITE_OTHER): Payer: 59 | Admitting: Internal Medicine

## 2018-12-13 VITALS — BP 124/76 | HR 72 | Temp 97.5°F | Resp 17 | Ht 66.5 in | Wt 215.6 lb

## 2018-12-13 DIAGNOSIS — G4733 Obstructive sleep apnea (adult) (pediatric): Secondary | ICD-10-CM

## 2018-12-13 DIAGNOSIS — Z1389 Encounter for screening for other disorder: Secondary | ICD-10-CM

## 2018-12-13 DIAGNOSIS — E785 Hyperlipidemia, unspecified: Secondary | ICD-10-CM

## 2018-12-13 DIAGNOSIS — E1165 Type 2 diabetes mellitus with hyperglycemia: Secondary | ICD-10-CM

## 2018-12-13 DIAGNOSIS — E039 Hypothyroidism, unspecified: Secondary | ICD-10-CM

## 2018-12-13 DIAGNOSIS — IMO0001 Reserved for inherently not codable concepts without codable children: Secondary | ICD-10-CM

## 2018-12-13 DIAGNOSIS — Z9989 Dependence on other enabling machines and devices: Secondary | ICD-10-CM

## 2018-12-13 DIAGNOSIS — E1169 Type 2 diabetes mellitus with other specified complication: Secondary | ICD-10-CM

## 2018-12-13 DIAGNOSIS — E669 Obesity, unspecified: Secondary | ICD-10-CM

## 2018-12-13 DIAGNOSIS — M62838 Other muscle spasm: Secondary | ICD-10-CM

## 2018-12-13 LAB — POCT URINALYSIS DIP (CLINITEK)
Bilirubin, UA: NEGATIVE
Blood, UA: NEGATIVE
Glucose, UA: NEGATIVE mg/dL
Ketones, POC UA: NEGATIVE mg/dL
Leukocytes, UA: NEGATIVE
Nitrite, UA: NEGATIVE
POC PROTEIN,UA: NEGATIVE
Spec Grav, UA: 1.025 (ref 1.010–1.025)
Urobilinogen, UA: 0.2 E.U./dL
pH, UA: 6.5 (ref 5.0–8.0)

## 2018-12-13 LAB — GLUCOSE, POCT (MANUAL RESULT ENTRY): POC Glucose: 189 mg/dl — AB (ref 70–99)

## 2018-12-13 MED ORDER — ROSUVASTATIN CALCIUM 10 MG PO TABS: 10.0000 mg | ORAL_TABLET | Freq: Every day | ORAL | 6 refills | Status: AC

## 2018-12-13 MED ORDER — LEVOTHYROXINE SODIUM 50 MCG PO TABS: 50.0000 ug | ORAL_TABLET | Freq: Every day | ORAL | 6 refills | Status: AC

## 2018-12-13 MED ORDER — METFORMIN HCL 1000 MG PO TABS: 1000.0000 mg | ORAL_TABLET | Freq: Two times a day (BID) | ORAL | 3 refills | Status: DC

## 2018-12-13 MED ORDER — CYCLOBENZAPRINE HCL 5 MG PO TABS: 5.0000 mg | ORAL_TABLET | Freq: Every day | ORAL | 1 refills | Status: DC | PRN

## 2018-12-13 MED ORDER — GLIPIZIDE 10 MG PO TABS: 20.0000 mg | ORAL_TABLET | Freq: Two times a day (BID) | ORAL | 6 refills | Status: DC

## 2018-12-13 NOTE — Progress Notes (Signed)
FSBS fasting have been in the 150s-160s. CBG today 189 fasting.

## 2018-12-13 NOTE — Telephone Encounter (Signed)
Pt states she went to pick up metFORMIN, and was informed that medication had not changed.Marland Kitchenand would like to know why it wasn't changed as discussed in appt..please follow up

## 2018-12-13 NOTE — Telephone Encounter (Signed)
Called patient back to let her know that Metformin was written as one 1000 mg tablet BID. During her office visit she mentioned that she had only been taking one 1000 mg tablet daily.

## 2018-12-13 NOTE — Progress Notes (Signed)
Patient ID: Alejandra Ray, female    DOB: 06/30/1960  MRN: [id number]  CC: Establish Care, Diabetes, Hyperlipidemia, and Hypothyroidism   Subjective: Alejandra Ray is a 58 y.o. female who presents for new pt visit and chronic disease management at [facility] Her concerns today include:  Hx of obesity, HL, DM, OSA on CPAP, Hypothyroid  Previous PCP was Dr. Porfirio Oarhelle Ray who was at [facility].  She is now at [facility] which is not in network for her insurance.    DM/Obesity: Dx 7 yrs ago.  -checking BS in a.m.  Range 146-160s.  BS this a.m was 189 fasting -Med:  Metformin 1 gram daily and Glipizide 20 mg BID.  Diarrhea with Metformin at times but not enough that she would want to be taken off the medication.  -Eating habits:  "I think I'm pretty good." Maintaining wgh okay.  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   No numbness in the extremities  HL:  Tolerating Crestor which she has been on for about a year.  However she describes cramps rest and sometimes with ambulation was going on prior to Crestor.  Reports being on Flexeril in the past which she took as needed.  She is requesting a prescription for the same..  Hypothyroid:  Dx 7-8 yrs ago No surgery on thyroid in the past No palpitations, hot or cold intolerance.  She gets diarrhea sometimes which she relates to being on metformin  OSA:  Uses CPAP every night.  Reports that she sleeps better has restorative sleep  Takes several vitamins for various reasons: Vit B 12 and [place] to help with over active bladder.  Milk Thistle for allergies.  Probiotics for upset stomach.  Takes Mg for leg cramps.  Takes a MV  HM: Due for MMG,  pap, and colonoscopy.  Last colonoscopy was 10 years ago.  She would like to hold off on referral for all 3 because she is about to change jobs and the new job will have better insurance.     Past medical history, social history, family history and surgical histories reviewed Patient Active Problem List   Diagnosis Date Noted  . Hyperlipidemia 03/12/2017  . Obesity (BMI 30-39.9) 06/18/2016  . OSA on CPAP 06/18/2014  . UARS (upper airway resistance syndrome) 06/18/2014  . Snoring 03/15/2014  . Stress headaches 03/15/2014  . Hypothyroidism 01/24/2014  . Screening for breast cancer 08/23/2013  . Perimenopausal 08/03/2011  . SUI (stress urinary incontinence, female) 08/03/2011  . Allergic rhinitis 08/03/2011  . 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 RLS (restless legs syndrome) 08/03/2011  . Type 2 diabetes mellitus with complication, without long-term current use of insulin (HCC)      Current Outpatient Medications on File Prior to Visit  Medication Sig Dispense Refill  . aspirin 81 MG tablet Take 81 mg by mouth every other day.     . Bacillus Coagulans-Inulin (PROBIOTIC) 1-250 BILLION-MG CAPS Take 1 capsule by mouth at bedtime.    Marland Kitchen. glucose blood test strip Use to check blood sugars daily for type 2 diabetes 100 each prn  . Lancet Devices (AUTO-LANCET) MISC Use to check blood sugars daily for type 2 diabetes - Dispense device compatible with glucometer 100 each 6  . magnesium 30 MG tablet Take 30 mg by mouth at bedtime.    .Marland Kitchen  MICROLET LANCETS MISC Use to check blood sugars daily for type 2 diabetes 100 each 3  . milk thistle 175 MG tablet Take 175 mg by mouth daily.    . Misc Natural Products (RED CLOVER COMBINATION PO) Take 200 mg by mouth daily.    . Multiple Vitamin (MULTIVITAMIN) capsule Take 1 capsule by mouth daily.    . Omega-3 1000 MG CAPS Take 1 capsule by mouth daily.    . vitamin B-12 (CYANOCOBALAMIN) 500 MCG tablet Take 500 mcg by mouth daily.     No current facility-administered medications on file prior to visit.     Allergies  Allergen Reactions  . Codeine Nausea And Vomiting    Blacked out  . Other     PLASTICS  . Sulfa Antibiotics Nausea And Vomiting     Social History   Socioeconomic History  . Marital status: Married    Spouse name: Alejandra Ray  . Number of children: 1  . Years of education: College  . Highest education level: Not on file  Occupational History  . Occupation: PRODUCTION    Comment: [employer] ([employer])  Social Needs  . Financial resource strain: Not on file  . Food insecurity    Worry: Not on file    Inability: Not on file  . Transportation needs    Medical: Not on file    Non-medical: Not on file  Tobacco Use  . Smoking status: Former Smoker    Types: Cigarettes    Quit date: 05/13/1996    Years since quitting: 22.6  . Smokeless tobacco: Never Used  Substance and Sexual Activity  . Alcohol use: Yes    Comment: Grey goose or beer. 3 drinks/wk  . Drug use: Yes    Types: Marijuana    Comment: occassionally  . Sexual activity: Never  Lifestyle  . Physical activity    Days per week: Not on file    Minutes per session: Not on file  . Stress: Not on file  Relationships  . Social Musicianconnections    Talks on phone: Not on file    Gets together: Not on file    Attends religious service: Not on file    Active member of club or organization: Not on file    Attends meetings of clubs or organizations: Not on file    Relationship status: Not on file  . Intimate partner violence    Fear of current or ex partner: Not on file    Emotionally abused: Not on file    Physically abused: Not on file    Forced sexual activity: Not on file  Other Topics Concern  . Not on file  Social History Narrative   Patient is married Alejandra Hua(Alejandra) and lives at home with her husband.   Patient has one adult son who lives in [place].   Husband has 2 daughters.   Patient is right-handed.   Patient does not drink any caffeine.    Education: some college. Exercise: No    Family History  Problem Relation Age of Onset  . Diabetes Mother   . Hyperlipidemia Mother   . Hypertension Mother   . Lung cancer Father        smoker   . Hypertension Sister   . Hypertension Sister   . Testicular cancer Son   . Hypertension Sister   . Hypertension Sister     Past Surgical History:  Procedure Laterality Date  . CHOLECYSTECTOMY    . KNEE ARTHROSCOPY Right   .  TUBAL LIGATION Bilateral 2001    ROS: Review of Systems Negative except as stated above  PHYSICAL EXAM: BP 124/76   Pulse 72   Temp (!) 97.5 F (36.4 C) (Temporal)   Resp 17   Ht 5' 6.5" (1.689 m)   Wt 215 lb 9.6 oz (97.8 kg)   LMP 08/31/2012   SpO2 96%   BMI 34.28 kg/m   Wt Readings from Last 3 Encounters:  12/13/18 215 lb 9.6 oz (97.8 kg)  10/21/17 215 lb (97.5 kg)  09/17/17 216 lb (98 kg)   Physical Exam  General appearance - alert, well appearing, older Caucasian female and in no distress Mental status - normal mood, behavior, speech, dress, motor activity, and thought processes Eyes - pupils equal and reactive, extraocular eye movements intact Nose - normal and patent, no erythema, discharge or polyps Mouth - mucous membranes moist, pharynx normal without lesions Neck - supple, no significant adenopathy Chest - clear to auscultation, no wheezes, rales or rhonchi, symmetric air entry Heart - normal rate, regular rhythm, normal S1, S2, no murmurs, rubs, clicks or gallops Abdomen - soft, nontender, nondistended, no masses or organomegaly Extremities - peripheral pulses normal, no pedal edema, no clubbing or cyanosis Diabetic Foot Exam - Simple   Simple Foot Form Visual Inspection No deformities, no ulcerations, no other skin breakdown bilaterally: Yes Sensation Testing Intact to touch and monofilament testing bilaterally: Yes Pulse Check Posterior Tibialis and Dorsalis pulse intact bilaterally: Yes Comments     CMP Latest Ref Rng & Units 09/17/2017 06/18/2017 03/12/2017  Glucose 65 - 99 mg/dL 191(Y154(H) 782(N161(H) 562(Z131(H)  BUN 6 - 24 mg/dL 13 14 14   Creatinine 0.57 - 1.00 mg/dL 3.080.60 6.570.59 8.460.60  Sodium 134 - 144 mmol/L 139 144 138  Potassium  3.5 - 5.2 mmol/L 4.5 4.8 4.3  Chloride 96 - 106 mmol/L 103 105 100  CO2 20 - 29 mmol/L 21 20 22   Calcium 8.7 - 10.2 mg/dL 9.3 96.210.0 9.5  Total Protein 6.0 - 8.5 g/dL 7.3 7.7 7.6  Total Bilirubin 0.0 - 1.2 mg/dL 0.4 0.4 0.4  Alkaline Phos 39 - 117 IU/L 40 46 43  AST 0 - 40 IU/L 28 25 40  ALT 0 - 32 IU/L 45(H) 42(H) 57(H)   Lipid Panel     Component Value Date/Time   CHOL 167 09/17/2017 0848   TRIG 61 09/17/2017 0848   HDL 50 09/17/2017 0848   CHOLHDL 3.3 09/17/2017 0848   CHOLHDL 4.4 08/22/2015 1205   VLDL 26 08/22/2015 1205   LDLCALC 105 (H) 09/17/2017 0848   LDLDIRECT 105 (H) 03/24/2012 0922    CBC    Component Value Date/Time   WBC 6.6 12/31/2014 1126   WBC 6.7 02/29/2012 0925   RBC 4.81 12/31/2014 1126   RBC 4.52 02/29/2012 0925   HGB 13.7 12/31/2014 1126   HGB 13.6 02/29/2012 0925   HCT 41.7 12/31/2014 1126   HCT 39.0 02/29/2012 0925   PLT 275 02/29/2012 0925   MCV 86.6 12/31/2014 1126   MCH 28.4 12/31/2014 1126   MCH 30.1 02/29/2012 0925   MCHC 32.8 12/31/2014 1126   MCHC 34.9 02/29/2012 0925   RDW 13.7 02/29/2012 0925   Results for orders placed or performed in visit on 12/13/18  POCT URINALYSIS DIP (CLINITEK)  Result Value Ref Range   Color, UA yellow yellow   Clarity, UA clear clear   Glucose, UA negative negative mg/dL   Bilirubin, UA negative negative   Ketones, POC UA  negative negative mg/dL   Spec Grav, UA 1.610 9.604 - 1.025   Blood, UA negative negative   pH, UA 6.5 5.0 - 8.0   POC PROTEIN,UA negative negative, trace   Urobilinogen, UA 0.2 0.2 or 1.0 E.U./dL   Nitrite, UA Negative Negative   Leukocytes, UA Negative Negative  Glucose (CBG)  Result Value Ref Range   POC Glucose 189 (A) 70 - 99 mg/dl    ASSESSMENT AND PLAN: 1. Diabetes mellitus type 2, uncontrolled, without complications (HCC) Reported blood sugars not at goal.  1 g twice a day.  Continue glipizide.  Continue to monitor blood sugars.  Continue healthy eating habits remain  active.  Did verify with her that she is taking 20 mg of glipizide twice a day - Glucose (CBG) - Microalbumin/Creatinine Ratio, Urine - Hemoglobin A1c - Lipid Panel - Ambulatory referral to Ophthalmology - CBC - Comprehensive metabolic panel - metFORMIN (GLUCOPHAGE) 1000 MG tablet; Take 1 tablet (1,000 mg total) by mouth 2 (two) times daily with a meal.  Dispense: 180 tablet; Refill: 3 - glipiZIDE (GLUCOTROL) 10 MG tablet; Take 2 tablets (20 mg total) by mouth 2 (two) times daily before a meal.  Dispense: 360 tablet; Refill: 6  2. Hyperlipidemia associated with type 2 diabetes mellitus (HCC) Advised patient that Crestor and other statins can cause spasms and cramps.  However she does not feel that spasms are due to Crestor as they were happening before she was placed on a medication.  She has good dorsalis pedis and posterior tibialis pulses physical exam PAD. - rosuvastatin (CRESTOR) 10 MG tablet; Take 1 tablet (10 mg total) by mouth daily.  Dispense: 30 tablet; Refill: 6  3. Screening for blood or protein in urine Urine dip was canceled.  This was changed to urine microalbumin - POCT URINALYSIS DIP (CLINITEK)  4. Acquired hypothyroidism - TSH - levothyroxine (SYNTHROID) 50 MCG tablet; Take 1 tablet (50 mcg total) by mouth daily.  Dispense: 30 tablet; Refill: 6  5. OSA on CPAP Encouraged her to continue being compliant with CPAP.  He is obviously deriving benefit from it  6. Obesity (BMI 30-39.9) #1 above  7. Muscle spasm See discussion under #2 above - cyclobenzaprine (FLEXERIL) 5 MG tablet; Take 1 tablet (5 mg total) by mouth daily as needed for muscle spasms.  Dispense: 30 tablet; Refill: 1    30 minutes spent with patient face-to-face evaluation discussing diagnosis and coordinating care.   Patient was given the opportunity to ask questions.  Patient verbalized understanding of the plan and was able to repeat key elements of the plan.   Orders Placed This Encounter   Procedures  . Microalbumin/Creatinine Ratio, Urine  . Hemoglobin A1c  . Lipid Panel  . TSH  . CBC  . Comprehensive metabolic panel  . Ambulatory referral to Ophthalmology  . POCT URINALYSIS DIP (CLINITEK)  . Glucose (CBG)     Requested Prescriptions   Signed Prescriptions Disp Refills  . levothyroxine (SYNTHROID) 50 MCG tablet 30 tablet 6    Sig: Take 1 tablet (50 mcg total) by mouth daily.  . rosuvastatin (CRESTOR) 10 MG tablet 30 tablet 6    Sig: Take 1 tablet (10 mg total) by mouth daily.  . metFORMIN (GLUCOPHAGE) 1000 MG tablet 180 tablet 3    Sig: Take 1 tablet (1,000 mg total) by mouth 2 (two) times daily with a meal.  . glipiZIDE (GLUCOTROL) 10 MG tablet 360 tablet 6    Sig: Take 2 tablets (20  mg total) by mouth 2 (two) times daily before a meal.  . cyclobenzaprine (FLEXERIL) 5 MG tablet 30 tablet 1    Sig: Take 1 tablet (5 mg total) by mouth daily as needed for muscle spasms.    Return in about 2 months (around 02/12/2019) for physical.  Karle Plumber, MD, [facility]al[facility]ssman

## 2018-12-14 ENCOUNTER — Other Ambulatory Visit: Payer: Self-pay | Admitting: Internal Medicine

## 2018-12-14 ENCOUNTER — Telehealth: Payer: Self-pay | Admitting: Internal Medicine

## 2018-12-14 ENCOUNTER — Encounter: Payer: Self-pay | Admitting: Internal Medicine

## 2018-12-14 DIAGNOSIS — IMO0001 Reserved for inherently not codable concepts without codable children: Secondary | ICD-10-CM

## 2018-12-14 LAB — HEMOGLOBIN A1C
Est. average glucose Bld gHb Est-mCnc: 163 mg/dL
Hgb A1c MFr Bld: 7.3 % — ABNORMAL HIGH (ref 4.8–5.6)

## 2018-12-14 LAB — COMPREHENSIVE METABOLIC PANEL
ALT: 47 IU/L — ABNORMAL HIGH (ref 0–32)
AST: 32 IU/L (ref 0–40)
Albumin/Globulin Ratio: 2 (ref 1.2–2.2)
Albumin: 5.2 g/dL — ABNORMAL HIGH (ref 3.8–4.9)
Alkaline Phosphatase: 45 IU/L (ref 39–117)
BUN/Creatinine Ratio: 18 (ref 12–28)
BUN: 14 mg/dL (ref 8–27)
Bilirubin Total: 0.5 mg/dL (ref 0.0–1.2)
CO2: 23 mmol/L (ref 20–29)
Calcium: 10.1 mg/dL (ref 8.7–10.3)
Chloride: 101 mmol/L (ref 96–106)
Creatinine, Ser: 0.77 mg/dL (ref 0.57–1.00)
GFR calc Af Amer: 97 mL/min/{1.73_m2} (ref >59)
GFR calc non Af Amer: 84 mL/min/{1.73_m2} (ref >59)
Globulin, Total: 2.6 g/dL (ref 1.5–4.5)
Glucose: 171 mg/dL — ABNORMAL HIGH (ref 65–99)
Potassium: 4.9 mmol/L (ref 3.5–5.2)
Sodium: 139 mmol/L (ref 134–144)
Total Protein: 7.8 g/dL (ref 6.0–8.5)

## 2018-12-14 LAB — LIPID PANEL
Chol/HDL Ratio: 2.3 ratio (ref 0.0–4.4)
Cholesterol, Total: 136 mg/dL (ref 100–199)
HDL: 59 mg/dL (ref >39)
LDL Calculated: 61 mg/dL (ref 0–99)
Triglycerides: 79 mg/dL (ref 0–149)
VLDL Cholesterol Cal: 16 mg/dL (ref 5–40)

## 2018-12-14 LAB — CBC
Hematocrit: 42.2 % (ref 34.0–46.6)
Hemoglobin: 13.9 g/dL (ref 11.1–15.9)
MCH: 29.6 pg (ref 26.6–33.0)
MCHC: 32.9 g/dL (ref 31.5–35.7)
MCV: 90 fL (ref 79–97)
Platelets: 278 10*3/uL (ref 150–450)
RBC: 4.69 x10E6/uL (ref 3.77–5.28)
RDW: 12.8 % (ref 11.7–15.4)
WBC: 8.1 10*3/uL (ref 3.4–10.8)

## 2018-12-14 LAB — TSH: TSH: 2.89 u[IU]/mL (ref 0.450–4.500)

## 2018-12-14 LAB — MICROALBUMIN / CREATININE URINE RATIO
Creatinine, Urine: 80.2 mg/dL
Microalb/Creat Ratio: 14 mg/g creat (ref 0–29)
Microalbumin, Urine: 11.1 ug/mL

## 2018-12-14 MED ORDER — METFORMIN HCL 1000 MG PO TABS: 1000.0000 mg | ORAL_TABLET | Freq: Two times a day (BID) | ORAL | 3 refills | Status: AC

## 2018-12-14 NOTE — Telephone Encounter (Signed)
Phone call placed to patient this morning on her mobile phone.  I left a message letting patient know that I will send lab results to her via her MyChart account.  I also was calling just to verify again that dose of glipizide that she was taking was 10 mg 2 tablets in the morning and 2 tablets in the evening as she had indicated yesterday.  I told her I was just verifying because usually Remax at 10 mg 1 tablet twice a day.  If she has any questions or concerns she can send me a MyChart message.

## 2018-12-22 ENCOUNTER — Encounter (INDEPENDENT_AMBULATORY_CARE_PROVIDER_SITE_OTHER): Payer: 59 | Admitting: Ophthalmology

## 2019-02-06 ENCOUNTER — Ambulatory Visit (INDEPENDENT_AMBULATORY_CARE_PROVIDER_SITE_OTHER): Payer: Managed Care, Other (non HMO) | Admitting: Family Medicine

## 2019-02-06 ENCOUNTER — Other Ambulatory Visit: Payer: Self-pay

## 2019-02-06 ENCOUNTER — Encounter: Payer: Self-pay | Admitting: Family Medicine

## 2019-02-06 VITALS — BP 130/80 | HR 70 | Temp 97.5°F | Ht 66.5 in | Wt 221.0 lb

## 2019-02-06 DIAGNOSIS — Z9989 Dependence on other enabling machines and devices: Secondary | ICD-10-CM | POA: Diagnosis not present

## 2019-02-06 DIAGNOSIS — G4733 Obstructive sleep apnea (adult) (pediatric): Secondary | ICD-10-CM | POA: Diagnosis not present

## 2019-02-06 NOTE — Patient Instructions (Signed)
Continue CPAP nightly and greater than 4 hours each night   Follow up in 1 year, sooner if needed   Sleep Apnea Sleep apnea affects breathing during sleep. It causes breathing to stop for a short time or to become shallow. It can also increase the risk of:  Heart attack.  Stroke.  Being very overweight (obese).  Diabetes.  Heart failure.  Irregular heartbeat. The goal of treatment is to help you breathe normally again. What are the causes? There are three kinds of sleep apnea:  Obstructive sleep apnea. This is caused by a blocked or collapsed airway.  Central sleep apnea. This happens when the brain does not send the right signals to the muscles that control breathing.  Mixed sleep apnea. This is a combination of obstructive and central sleep apnea. The most common cause of this condition is a collapsed or blocked airway. This can happen if:  Your throat muscles are too relaxed.  Your tongue and tonsils are too large.  You are overweight.  Your airway is too small. What increases the risk?  Being overweight.  Smoking.  Having a small airway.  Being older.  Being female.  Drinking alcohol.  Taking medicines to calm yourself (sedatives or tranquilizers).  Having family members with the condition. What are the signs or symptoms?  Trouble staying asleep.  Being sleepy or tired during the day.  Getting angry a lot.  Loud snoring.  Headaches in the morning.  Not being able to focus your mind (concentrate).  Forgetting things.  Less interest in sex.  Mood swings.  Personality changes.  Feelings of sadness (depression).  Waking up a lot during the night to pee (urinate).  Dry mouth.  Sore throat. How is this diagnosed?  Your medical history.  A physical exam.  A test that is done when you are sleeping (sleep study). The test is most often done in a sleep lab but may also be done at home. How is this treated?   Sleeping on your  side.  Using a medicine to get rid of mucus in your nose (decongestant).  Avoiding the use of alcohol, medicines to help you relax, or certain pain medicines (narcotics).  Losing weight, if needed.  Changing your diet.  Not smoking.  Using a machine to open your airway while you sleep, such as: ? An oral appliance. This is a mouthpiece that shifts your lower jaw forward. ? A CPAP device. This device blows air through a mask when you breathe out (exhale). ? An EPAP device. This has valves that you put in each nostril. ? A BPAP device. This device blows air through a mask when you breathe in (inhale) and breathe out.  Having surgery if other treatments do not work. It is important to get treatment for sleep apnea. Without treatment, it can lead to:  High blood pressure.  Coronary artery disease.  In men, not being able to have an erection (impotence).  Reduced thinking ability. Follow these instructions at home: Lifestyle  Make changes that your doctor recommends.  Eat a healthy diet.  Lose weight if needed.  Avoid alcohol, medicines to help you relax, and some pain medicines.  Do not use any products that contain nicotine or tobacco, such as cigarettes, e-cigarettes, and chewing tobacco. If you need help quitting, ask your doctor. General instructions  Take over-the-counter and prescription medicines only as told by your doctor.  If you were given a machine to use while you sleep, use it only   as told by your doctor.  If you are having surgery, make sure to tell your doctor you have sleep apnea. You may need to bring your device with you.  Keep all follow-up visits as told by your doctor. This is important. Contact a doctor if:  The machine that you were given to use during sleep bothers you or does not seem to be working.  You do not get better.  You get worse. Get help right away if:  Your chest hurts.  You have trouble breathing in enough air.  You have  an uncomfortable feeling in your back, arms, or stomach.  You have trouble talking.  One side of your body feels weak.  A part of your face is hanging down. These symptoms may be an emergency. Do not wait to see if the symptoms will go away. Get medical help right away. Call your local emergency services (911 in the [place] Do not drive yourself to the hospital. Summary  This condition affects breathing during sleep.  The most common cause is a collapsed or blocked airway.  The goal of treatment is to help you breathe normally while you sleep. This information is not intended to replace advice given to you by your health care provider. Make sure you discuss any questions you have with your health care provider. Document Released: 02/04/2008 Document Revised: 02/11/2018 Document Reviewed: 12/21/2017 Elsevier Patient Education  2020 [employer]  

## 2019-02-06 NOTE — Progress Notes (Signed)
PATIENT: Alejandra Ray DOB: Dec 28, 1960  REASON FOR VISIT: follow up HISTORY FROM: patient  Chief Complaint  Patient presents with  . Follow-up    Rm 2, alone.  Doing well on cpap  . cpap     HISTORY OF PRESENT ILLNESS: Today 02/06/19 Alejandra Ray is a 58 y.o. female here today for follow up for OSA on CPAP.  She reports doing very well with therapy.  She definitely notes benefit.  Compliance report dated 01/03/2019 through 01/29/2019 reveals that she is using CPAP every night for compliance of 100%.  Every night she used CPAP greater than 4 hours.  Average usage was 8 hours and 43 minutes.  AHI was 1.0 on 5 to 12 cm of water and an EPR of 2.  There was no significant leak noted.  She is feeling well today and without complaints.  HISTORY: (copied from [place] note on 06/21/2017)  Ms. [name] is a 58 year old female with a history of obstructive sleep apnea on CPAP.  She returns today for compliance download.  Her download indicates that she use her machine 30 out of 30 days for compliance of 100%.  She used her machine greater than 4 hours each night.  On average she uses her machine 7 hours and 51 minutes.  Her residual AHI is 1.5 on minimum pressure of 5 cm of water and maximum pressure of 12 cm of water with EPR of 2.  She does not have a leak with her mask.  Overall she reports that she is doing well.  She reports that her Epworth sleepiness score is slightly elevated but this is due to her job.  She reports that her job is very strenuous.  She returns today for an evaluation.  HISTORY History from the eighth ofFebruary2018, Alejandra Ray is here for her yearly compliance visit which has been 100% over the last 30 days with average user time 8 hours and 20 minutes, she is using AutoSet CPAP between 5 and 12 cm water with a full-time expiratory pressure relief of 2 cm water and a residual AHI of 2.2 minimal air leaks using a nasal pillow, she has recovered from her in  contact dermatitis that she first displayed, using just Vaseline to protect her skin. The 95th percentile pressure is 8.6 cm water.   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 She has found herself unemployed for 4 months now. She has started to sleep more as she doesn't have to get up early in the morning, and there is probably some component of depression   REVIEW OF SYSTEMS: Out of a complete 14 system review of symptoms, the patient complains only of the following symptoms, none and all other reviewed systems are negative.  Epworth sleepiness scale 6  ALLERGIES: Allergies  Allergen Reactions  . Codeine Nausea And Vomiting    Blacked out  . Other     PLASTICS  . Sulfa Antibiotics Nausea And Vomiting    HOME MEDICATIONS: Outpatient Medications Prior to Visit  Medication Sig Dispense Refill  . aspirin 81 MG tablet Take 81 mg by mouth every other day.     . Bacillus Coagulans-Inulin (PROBIOTIC) 1-250 BILLION-MG CAPS Take 1 capsule by mouth at bedtime.    . cyclobenzaprine (FLEXERIL) 5 MG tablet Take 1 tablet (5 mg total) by mouth daily as needed for muscle spasms. 30 tablet 1  .  glipiZIDE (GLUCOTROL) 10 MG tablet Take 2 tablets (20 mg total) by mouth 2 (two) times daily before a meal. 360 tablet 6  . glucose blood test strip Use to check blood sugars daily for type 2 diabetes 100 each prn  . Lancet Devices (AUTO-LANCET) MISC Use to check blood sugars daily for type 2 diabetes - Dispense device compatible with glucometer 100 each 6  . levothyroxine (SYNTHROID) 50 MCG tablet Take 1 tablet (50 mcg total) by mouth daily. 30 tablet 6  . magnesium 30 MG tablet Take 30 mg by mouth at bedtime.    . metFORMIN (GLUCOPHAGE) 1000 MG tablet Take 1 tablet (1,000 mg total) by mouth 2 (two) times daily with a meal. 180 tablet 3  . MICROLET LANCETS MISC Use to check blood sugars daily  for type 2 diabetes 100 each 3  . milk thistle 175 MG tablet Take 175 mg by mouth daily.    . Misc Natural Products (RED CLOVER COMBINATION PO) Take 200 mg by mouth daily.    . Multiple Vitamin (MULTIVITAMIN) capsule Take 1 capsule by mouth daily.    . Omega-3 1000 MG CAPS Take 1 capsule by mouth daily.    . rosuvastatin (CRESTOR) 10 MG tablet Take 1 tablet (10 mg total) by mouth daily. 30 tablet 6  . vitamin B-12 (CYANOCOBALAMIN) 500 MCG tablet Take 500 mcg by mouth daily.     No facility-administered medications prior to visit.     PAST MEDICAL HISTORY: Past Medical History:  Diagnosis Date  . Allergic rhinitis   . HSV-2 infection   . Hyperlipidemia   . Hypothyroidism   . OSA on CPAP   . Severe obesity (HCC)   . SUI (stress urinary incontinence, female)   . Type 2 diabetes mellitus (HCC)     PAST SURGICAL HISTORY: Past Surgical History:  Procedure Laterality Date  . CHOLECYSTECTOMY    . KNEE ARTHROSCOPY Right   . TUBAL LIGATION Bilateral 2001    FAMILY HISTORY: Family History  Problem Relation Age of Onset  . Diabetes Mother   . Hyperlipidemia Mother   . Hypertension Mother   . Lung cancer Father        smoker  . Hypertension Sister   . Hypertension Sister   . Testicular cancer Son   . Hypertension Sister   . Hypertension Sister     SOCIAL HISTORY: Social History   Socioeconomic History  . Marital status: Married    Spouse name: Zeb Comfort  . Number of children: 1  . Years of education: College  . Highest education level: Not on file  Occupational History  . Occupation: PRODUCTION    Comment: [employer] ([employer])  Social Needs  . Financial resource strain: Not on file  . Food insecurity    Worry: Not on file    Inability: Not on file  . Transportation needs    Medical: Not on file    Non-medical: Not on file  Tobacco Use  . Smoking status: Former Smoker    Types: Cigarettes    Quit date: 05/13/1996    Years since quitting: 22.7  . Smokeless  tobacco: Never Used  Substance and Sexual Activity  . Alcohol use: Yes    Comment: Grey goose or beer. 3 drinks/wk  . Drug use: Yes    Types: Marijuana    Comment: occassionally  . Sexual activity: Never  Lifestyle  . Physical activity    Days per week: Not on file  Minutes per session: Not on file  . Stress: Not on file  Relationships  . Social Herbalist on phone: Not on file    Gets together: Not on file    Attends religious service: Not on file    Active member of club or organization: Not on file    Attends meetings of clubs or organizations: Not on file    Relationship status: Not on file  . Intimate partner violence    Fear of current or ex partner: Not on file    Emotionally abused: Not on file    Physically abused: Not on file    Forced sexual activity: Not on file  Other Topics Concern  . Not on file  Social History Narrative   Patient is married Shanon Brow) and lives at home with her husband.   Patient has one adult son who lives in [place].   Husband has 2 daughters.   Patient is right-handed.   Patient does not drink any caffeine.    Education: some college. Exercise: No      PHYSICAL EXAM  Vitals:   02/06/19 1138  BP: 130/80  Pulse: 70  Temp: (!) 97.5 F (36.4 C)  Weight: 221 lb (100.2 kg)  Height: 5' 6.5" (1.689 m)   Body mass index is 35.14 kg/m.  Generalized: Well developed, in no acute distress  Cardiology: normal rate and rhythm, no murmur noted Respiratory: Clear to auscultation bilaterally Mallampati 3+, neck circ 17.5" Neurological examination  Mentation: Alert oriented to time, place, history taking. Follows all commands speech and language fluent Cranial nerve II-XII: Pupils were equal round reactive to light. Extraocular movements were full, visual field were full on confrontational test. Facial sensation and strength were normal. Uvula tongue midline. Head turning and shoulder shrug  were normal and symmetric. Motor:  The motor testing reveals 5 over 5 strength of all 4 extremities. Good symmetric motor tone is noted throughout.  Sensory: Sensory testing is intact to soft touch on all 4 extremities. No evidence of extinction is noted.  Coordination: Cerebellar testing reveals good finger-nose-finger and heel-to-shin bilaterally.  Gait and station: Gait is normal  DIAGNOSTIC DATA (LABS, IMAGING, TESTING) - I reviewed patient records, labs, notes, testing and imaging myself where available.  No flowsheet data found.   Lab Results  Component Value Date   WBC 8.1 12/13/2018   HGB 13.9 12/13/2018   HCT 42.2 12/13/2018   MCV 90 12/13/2018   PLT 278 12/13/2018      Component Value Date/Time   NA 139 12/13/2018 1018   K 4.9 12/13/2018 1018   CL 101 12/13/2018 1018   CO2 23 12/13/2018 1018   GLUCOSE 171 (H) 12/13/2018 1018   GLUCOSE 358 (H) 03/18/2016 0907   BUN 14 12/13/2018 1018   CREATININE 0.77 12/13/2018 1018   CREATININE 0.66 03/18/2016 0907   CALCIUM 10.1 12/13/2018 1018   PROT 7.8 12/13/2018 1018   ALBUMIN 5.2 (H) 12/13/2018 1018   AST 32 12/13/2018 1018   ALT 47 (H) 12/13/2018 1018   ALKPHOS 45 12/13/2018 1018   BILITOT 0.5 12/13/2018 1018   GFRNONAA 84 12/13/2018 1018   GFRNONAA >89 01/24/2014 1042   GFRAA 97 12/13/2018 1018   GFRAA >89 01/24/2014 1042   Lab Results  Component Value Date   CHOL 136 12/13/2018   HDL 59 12/13/2018   LDLCALC 61 12/13/2018   LDLDIRECT 105 (H) 03/24/2012   TRIG 79 12/13/2018   CHOLHDL 2.3 12/13/2018  Lab Results  Component Value Date   HGBA1C 7.3 (H) 12/13/2018   Lab Results  Component Value Date   VITAMINB12 >2000 (H) 01/24/2015   Lab Results  Component Value Date   TSH 2.890 12/13/2018     ASSESSMENT AND PLAN 58 y.o. year old female  has a past medical history of Allergic rhinitis, HSV-2 infection, Hyperlipidemia, Hypothyroidism, OSA on CPAP, Severe obesity (HCC), SUI (stress urinary incontinence, female), and Type 2 diabetes  mellitus (HCC). here with     ICD-10-CM   1. OSA on CPAP  G47.33    Z99.89     Santina EvansCatherine is doing very well on CPAP therapy.  Compliance report reveals excellent compliance.  She was encouraged to continue CPAP nightly and for greater than 4 hours each night.  She will follow-up with us annually, sooner if needed.  She verbalizes understanding and agreement with this plan.   No orders of the defined types were placed in this encounter.    No orders of the defined types were placed in this encounter.     I spent 15 minutes with the patient. 50% of this time was spent counseling and educating patient on plan of care and medications.    Shawnie Dappermy Nydia Ytuarte, FNP-C 02/06/2019, 12:15 PM [facility] [address], [address] [place], [place] [address] [phone]

## 2019-04-13 LAB — HM DIABETES EYE EXAM

## 2019-07-27 ENCOUNTER — Ambulatory Visit: Payer: Self-pay | Attending: Internal Medicine

## 2019-07-27 DIAGNOSIS — Z23 Encounter for immunization: Secondary | ICD-10-CM

## 2019-07-27 NOTE — Progress Notes (Signed)
   Covid-19 Vaccination Clinic  Name:  BRITTINEE RISK    MRN: [id number] DOB: 11-29-1960  07/27/2019  Ms. Zill was observed post Covid-19 immunization for 15 minutes without incident. She was provided with Vaccine Information Sheet and instruction to access the V-Safe system.   Ms. Stamour was instructed to call 911 with any severe reactions post vaccine: Marland Kitchen Difficulty breathing  . Swelling of face and throat  . A fast heartbeat  . A bad rash all over body  . Dizziness and weakness   Immunizations Administered    Name Date Dose VIS Date Route   Pfizer COVID-19 Vaccine 07/27/2019  3:38 PM 0.3 mL 04/21/2019 Intramuscular   Manufacturer: [employer], [employer]   Lot: [id number]   NDC: [address]-2

## 2019-08-21 ENCOUNTER — Ambulatory Visit: Payer: Self-pay

## 2019-08-29 ENCOUNTER — Ambulatory Visit: Payer: Self-pay | Attending: Internal Medicine

## 2019-08-29 DIAGNOSIS — Z23 Encounter for immunization: Secondary | ICD-10-CM

## 2019-08-29 NOTE — Progress Notes (Signed)
   Covid-19 Vaccination Clinic  Name:  Alejandra Ray    MRN: [id number] DOB: November 05, 1960  08/29/2019  [name] was observed post Covid-19 immunization for 15 minutes without incident. She was provided with Vaccine Information Sheet and instruction to access the V-Safe system.   [name] was instructed to call 911 with any severe reactions post vaccine: Marland Kitchen Difficulty breathing  . Swelling of face and throat  . A fast heartbeat  . A bad rash all over body  . Dizziness and weakness   Immunizations Administered    Name Date Dose VIS Date Route   Pfizer COVID-19 Vaccine 08/29/2019  3:21 PM 0.3 mL 07/05/2018 Intramuscular   Manufacturer: [employer], [employer]   Lot: [id number]   NDC: [address]-2

## 2020-01-02 ENCOUNTER — Other Ambulatory Visit: Payer: Self-pay | Admitting: Physician Assistant

## 2020-01-02 ENCOUNTER — Other Ambulatory Visit (HOSPITAL_COMMUNITY): Payer: Self-pay | Admitting: Physician Assistant

## 2020-01-13 ENCOUNTER — Other Ambulatory Visit: Payer: Self-pay | Admitting: Internal Medicine

## 2020-01-13 DIAGNOSIS — E039 Hypothyroidism, unspecified: Secondary | ICD-10-CM

## 2020-01-13 NOTE — Telephone Encounter (Signed)
  Notes to clinic: Is this pt associated with your practice currently? Bing Neighbors PCP is not one we approve Rx for but Dr. Laural Benes wrote the last Rx for the med. Please assess.

## 2020-02-05 NOTE — Progress Notes (Deleted)
PATIENT: Alejandra Ray DOB: 19-Mar-1961  REASON FOR VISIT: follow up HISTORY FROM: patient  No chief complaint on file.    HISTORY OF PRESENT ILLNESS: Today 02/05/20 Alejandra Ray is a 59 y.o. female here today for follow up for OSA on CPAP.   Compliance report dated    HISTORY: (copied from my note on 02/06/2019)  Alejandra Ray is a 59 y.o. female here today for follow up for OSA on CPAP.  She reports doing very well with therapy.  She definitely notes benefit.  Compliance report dated 01/03/2019 through 01/29/2019 reveals that she is using CPAP every night for compliance of 100%.  Every night she used CPAP greater than 4 hours.  Average usage was 8 hours and 43 minutes.  AHI was 1.0 on 5 to 12 cm of water and an EPR of 2.  There was no significant leak noted.  She is feeling well today and without complaints.  HISTORY: (copied from [place] note on 06/21/2017)  Alejandra Ray a 59 year old female with a history of obstructive sleep apnea on CPAP. She returns today for compliance download. Her download indicates that she use her machine 30 out of 30 days for compliance of 100%. She used her machine greater than 4 hours each night. On average she uses her machine 7 hours and 51 minutes. Her residual AHI is 1.5 on minimum pressure of 5 cm of water and maximum pressure of 12 cm of water with EPR of 2. She does not have a leak with her mask. Overall she reports that she is doing well. She reports that her Epworth sleepiness score is slightly elevated but this is due to her job. She reports that her job is very strenuous. She returns today for an evaluation.  HISTORY History from the eighth ofFebruary2018, Alejandra Ray is here for her yearly compliance visit which has been 100% over the last 30 days with average user time 8 hours and 20 minutes, she is using AutoSet CPAP between 5 and 12 cm water with a full-time expiratory pressure relief of 2 cm water and a  residual AHI of 2.2 minimal air leaks using a nasal pillow, she has recovered from her in contact dermatitis that she first displayed, using just Vaseline to protect her skin. The 95th percentile pressure is 8.6 cm water.   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 She has found herself unemployed for 4 months now. She has started to sleep more as she doesn't have to get up early in the morning, and there is probably some component of depression    REVIEW OF SYSTEMS: Out of a complete 14 system review of symptoms, the patient complains only of the following symptoms, and all other reviewed systems are negative.  ALLERGIES: Allergies  Allergen Reactions  . Codeine Nausea And Vomiting    Blacked out  . Other     PLASTICS  . Sulfa Antibiotics Nausea And Vomiting    HOME MEDICATIONS: Outpatient Medications Prior to Visit  Medication Sig Dispense Refill  . aspirin 81 MG tablet Take 81 mg by mouth every other day.     . Bacillus Coagulans-Inulin (PROBIOTIC) 1-250 BILLION-MG CAPS Take 1 capsule by mouth at bedtime.    . cyclobenzaprine (FLEXERIL) 5 MG tablet Take 1 tablet (5 mg total) by mouth daily as needed for muscle spasms. 30 tablet 1  .  glipiZIDE (GLUCOTROL) 10 MG tablet Take 2 tablets (20 mg total) by mouth 2 (two) times daily before a meal. 360 tablet 6  . glucose blood test strip Use to check blood sugars daily for type 2 diabetes 100 each prn  . Lancet Devices (AUTO-LANCET) MISC Use to check blood sugars daily for type 2 diabetes - Dispense device compatible with glucometer 100 each 6  . levothyroxine (SYNTHROID) 50 MCG tablet Take 1 tablet (50 mcg total) by mouth daily. 30 tablet 6  . magnesium 30 MG tablet Take 30 mg by mouth at bedtime.    . metFORMIN (GLUCOPHAGE) 1000 MG tablet Take 1 tablet (1,000 mg total) by mouth 2 (two) times daily with a meal. 180 tablet 3   . MICROLET LANCETS MISC Use to check blood sugars daily for type 2 diabetes 100 each 3  . milk thistle 175 MG tablet Take 175 mg by mouth daily.    . Misc Natural Products (RED CLOVER COMBINATION PO) Take 200 mg by mouth daily.    . Multiple Vitamin (MULTIVITAMIN) capsule Take 1 capsule by mouth daily.    . Omega-3 1000 MG CAPS Take 1 capsule by mouth daily.    . rosuvastatin (CRESTOR) 10 MG tablet Take 1 tablet (10 mg total) by mouth daily. 30 tablet 6  . vitamin B-12 (CYANOCOBALAMIN) 500 MCG tablet Take 500 mcg by mouth daily.     No facility-administered medications prior to visit.    PAST MEDICAL HISTORY: Past Medical History:  Diagnosis Date  . Allergic rhinitis   . HSV-2 infection   . Hyperlipidemia   . Hypothyroidism   . OSA on CPAP   . Severe obesity (HCC)   . SUI (stress urinary incontinence, female)   . Type 2 diabetes mellitus (HCC)     PAST SURGICAL HISTORY: Past Surgical History:  Procedure Laterality Date  . CHOLECYSTECTOMY    . KNEE ARTHROSCOPY Right   . TUBAL LIGATION Bilateral 2001    FAMILY HISTORY: Family History  Problem Relation Age of Onset  . Diabetes Mother   . Hyperlipidemia Mother   . Hypertension Mother   . Lung cancer Father        smoker  . Hypertension Sister   . Hypertension Sister   . Testicular cancer Son   . Hypertension Sister   . Hypertension Sister     SOCIAL HISTORY: Social History   Socioeconomic History  . Marital status: Married    Spouse name: Zeb ComfortDavid Honeycutt  . Number of children: 1  . Years of education: College  . Highest education level: Not on file  Occupational History  . Occupation: PRODUCTION    Comment: [employer] ([employer] pads)  Tobacco Use  . Smoking status: Former Smoker    Types: Cigarettes    Quit date: 05/13/1996    Years since quitting: 23.7  . Smokeless tobacco: Never Used  Vaping Use  . Vaping Use: Never used  Substance and Sexual Activity  . Alcohol use: Yes    Comment: Grey goose or beer. 3  drinks/wk  . Drug use: Yes    Types: Marijuana    Comment: occassionally  . Sexual activity: Never  Other Topics Concern  . Not on file  Social History Narrative   Patient is married [name](David) and lives at home with her husband.   Patient has one adult son who lives in [place].   Husband has 2 daughters.   Patient is right-handed.   Patient does not drink any caffeine.  Education: some college. Exercise: No   Social Determinants of Health   Financial Resource Strain:   . Difficulty of Paying Living Expenses: Not on file  Food Insecurity:   . Worried About Programme researcher, broadcasting/film/video in the Last Year: Not on file  . Ran Out of Food in the Last Year: Not on file  Transportation Needs:   . Lack of Transportation (Medical): Not on file  . Lack of Transportation (Non-Medical): Not on file  Physical Activity:   . Days of Exercise per Week: Not on file  . Minutes of Exercise per Session: Not on file  Stress:   . Feeling of Stress : Not on file  Social Connections:   . Frequency of Communication with Friends and Family: Not on file  . Frequency of Social Gatherings with Friends and Family: Not on file  . Attends Religious Services: Not on file  . Active Member of Clubs or Organizations: Not on file  . Attends Banker Meetings: Not on file  . Marital Status: Not on file  Intimate Partner Violence:   . Fear of Current or Ex-Partner: Not on file  . Emotionally Abused: Not on file  . Physically Abused: Not on file  . Sexually Abused: Not on file      PHYSICAL EXAM  There were no vitals filed for this visit. There is no height or weight on file to calculate BMI.  Generalized: Well developed, in no acute distress  Cardiology: normal rate and rhythm, no murmur noted Respiratory: clear to auscultation bilaterally  Neurological examination  Mentation: Alert oriented to time, place, history taking. Follows all commands speech and language fluent Cranial nerve II-XII:  Pupils were equal round reactive to light. Extraocular movements were full, visual field were full  Motor: The motor testing reveals 5 over 5 strength of all 4 extremities. Good symmetric motor tone is noted throughout.   Gait and station: Gait is normal.    DIAGNOSTIC DATA (LABS, IMAGING, TESTING) - I reviewed patient records, labs, notes, testing and imaging myself where available.  No flowsheet data found.   Lab Results  Component Value Date   WBC 8.1 12/13/2018   HGB 13.9 12/13/2018   HCT 42.2 12/13/2018   MCV 90 12/13/2018   PLT 278 12/13/2018      Component Value Date/Time   NA 139 12/13/2018 1018   K 4.9 12/13/2018 1018   CL 101 12/13/2018 1018   CO2 23 12/13/2018 1018   GLUCOSE 171 (H) 12/13/2018 1018   GLUCOSE 358 (H) 03/18/2016 0907   BUN 14 12/13/2018 1018   CREATININE 0.77 12/13/2018 1018   CREATININE 0.66 03/18/2016 0907   CALCIUM 10.1 12/13/2018 1018   PROT 7.8 12/13/2018 1018   ALBUMIN 5.2 (H) 12/13/2018 1018   AST 32 12/13/2018 1018   ALT 47 (H) 12/13/2018 1018   ALKPHOS 45 12/13/2018 1018   BILITOT 0.5 12/13/2018 1018   GFRNONAA 84 12/13/2018 1018   GFRNONAA >89 01/24/2014 1042   GFRAA 97 12/13/2018 1018   GFRAA >89 01/24/2014 1042   Lab Results  Component Value Date   CHOL 136 12/13/2018   HDL 59 12/13/2018   LDLCALC 61 12/13/2018   LDLDIRECT 105 (H) 03/24/2012   TRIG 79 12/13/2018   CHOLHDL 2.3 12/13/2018   Lab Results  Component Value Date   HGBA1C 7.3 (H) 12/13/2018   Lab Results  Component Value Date   VITAMINB12 >2000 (H) 01/24/2015   Lab Results  Component Value Date  TSH 2.890 12/13/2018       ASSESSMENT AND PLAN 59 y.o. year old female  has a past medical history of Allergic rhinitis, HSV-2 infection, Hyperlipidemia, Hypothyroidism, OSA on CPAP, Severe obesity (HCC), SUI (stress urinary incontinence, female), and Type 2 diabetes mellitus (HCC). here with   No diagnosis found.   Paraskevi is doing well on CPAP therapy.  Compliance report reveals  . She was encouraged to continue using CPAP nightly and greater than 4 hours each night. healhty lifestyle habits encouraged. She will follow up with me in 1 year, sooner if needed.    No orders of the defined types were placed in this encounter.    No orders of the defined types were placed in this encounter.     I spent 15 minutes with the patient. 50% of this time was spent counseling and educating patient on plan of care and medications.    Shawnie Dapper, FNP-C 02/05/2020, 11:17 AM [facility] [address], [address] [place], [place] [phone]

## 2020-02-06 ENCOUNTER — Ambulatory Visit: Payer: Managed Care, Other (non HMO) | Admitting: Family Medicine

## 2020-05-16 NOTE — Patient Instructions (Signed)
Please continue using your CPAP regularly. While your insurance requires that you use CPAP at least 4 hours each night on 70% of the nights, I recommend, that you not skip any nights and use it throughout the night if you can. Getting used to CPAP and staying with the treatment long term does take time and patience and discipline. Untreated obstructive sleep apnea when it is moderate to severe can have an adverse impact on cardiovascular health and raise her risk for heart disease, arrhythmias, hypertension, congestive heart failure, stroke and diabetes. Untreated obstructive sleep apnea causes sleep disruption, nonrestorative sleep, and sleep deprivation. This can have an impact on your day to day functioning and cause daytime sleepiness and impairment of cognitive function, memory loss, mood disturbance, and problems focussing. Using CPAP regularly can improve these symptoms.   Follow up in 1 year   You may receive a survey regarding today's visit. I encourage you to leave honest feed back as I do use this information to improve patient care. Thank you for seeing me today!      Sleep Apnea Sleep apnea affects breathing during sleep. It causes breathing to stop for a short time or to become shallow. It can also increase the risk of:  Heart attack.  Stroke.  Being very overweight (obese).  Diabetes.  Heart failure.  Irregular heartbeat. The goal of treatment is to help you breathe normally again. What are the causes? There are three kinds of sleep apnea:  Obstructive sleep apnea. This is caused by a blocked or collapsed airway.  Central sleep apnea. This happens when the brain does not send the right signals to the muscles that control breathing.  Mixed sleep apnea. This is a combination of obstructive and central sleep apnea. The most common cause of this condition is a collapsed or blocked airway. This can happen if:  Your throat muscles are too relaxed.  Your tongue and  tonsils are too large.  You are overweight.  Your airway is too small. What increases the risk?  Being overweight.  Smoking.  Having a small airway.  Being older.  Being female.  Drinking alcohol.  Taking medicines to calm yourself (sedatives or tranquilizers).  Having family members with the condition. What are the signs or symptoms?  Trouble staying asleep.  Being sleepy or tired during the day.  Getting angry a lot.  Loud snoring.  Headaches in the morning.  Not being able to focus your mind (concentrate).  Forgetting things.  Less interest in sex.  Mood swings.  Personality changes.  Feelings of sadness (depression).  Waking up a lot during the night to pee (urinate).  Dry mouth.  Sore throat. How is this diagnosed?  Your medical history.  A physical exam.  A test that is done when you are sleeping (sleep study). The test is most often done in a sleep lab but may also be done at home. How is this treated?   Sleeping on your side.  Using a medicine to get rid of mucus in your nose (decongestant).  Avoiding the use of alcohol, medicines to help you relax, or certain pain medicines (narcotics).  Losing weight, if needed.  Changing your diet.  Not smoking.  Using a machine to open your airway while you sleep, such as: ? An oral appliance. This is a mouthpiece that shifts your lower jaw forward. ? A CPAP device. This device blows air through a mask when you breathe out (exhale). ? An EPAP device. This has  valves that you put in each nostril. ? A BPAP device. This device blows air through a mask when you breathe in (inhale) and breathe out.  Having surgery if other treatments do not work. It is important to get treatment for sleep apnea. Without treatment, it can lead to:  High blood pressure.  Coronary artery disease.  In men, not being able to have an erection (impotence).  Reduced thinking ability. Follow these instructions at  home: Lifestyle  Make changes that your doctor recommends.  Eat a healthy diet.  Lose weight if needed.  Avoid alcohol, medicines to help you relax, and some pain medicines.  Do not use any products that contain nicotine or tobacco, such as cigarettes, e-cigarettes, and chewing tobacco. If you need help quitting, ask your doctor. General instructions  Take over-the-counter and prescription medicines only as told by your doctor.  If you were given a machine to use while you sleep, use it only as told by your doctor.  If you are having surgery, make sure to tell your doctor you have sleep apnea. You may need to bring your device with you.  Keep all follow-up visits as told by your doctor. This is important. Contact a doctor if:  The machine that you were given to use during sleep bothers you or does not seem to be working.  You do not get better.  You get worse. Get help right away if:  Your chest hurts.  You have trouble breathing in enough air.  You have an uncomfortable feeling in your back, arms, or stomach.  You have trouble talking.  One side of your body feels weak.  A part of your face is hanging down. These symptoms may be an emergency. Do not wait to see if the symptoms will go away. Get medical help right away. Call your local emergency services (911 in the [place] Do not drive yourself to the hospital. Summary  This condition affects breathing during sleep.  The most common cause is a collapsed or blocked airway.  The goal of treatment is to help you breathe normally while you sleep. This information is not intended to replace advice given to you by your health care provider. Make sure you discuss any questions you have with your health care provider. Document Revised: 02/11/2018 Document Reviewed: 12/21/2017 Elsevier Patient Education  2020 [employer].

## 2020-05-16 NOTE — Progress Notes (Signed)
PATIENT: Alejandra Ray DOB: Mar 05, 1961  REASON FOR VISIT: follow up HISTORY FROM: patient  Chief Complaint  Patient presents with   Follow-up    Rm 2, alone, pt states she is doing well with cpap     HISTORY OF PRESENT ILLNESS: Today 05/20/20  She is here today for CPAP follow-up.  She continues to do very well with CPAP therapy.  She denies difficulty with her machine or getting supplies.  Compliance report dated 04/16/2020 through 05/15/2020 reveals that she used CPAP 30 of the past 30 days for compliance of 100%.  She is CPAP greater than 4 hours all 30 days.  Average usage was 8 hours and 10 minutes.  Residual AHI was 0.9 on 5 to 12 cm of water and an EPR of two.  There was no leak noted.   History (copied from previous note) 02/06/2019 Alejandra Ray is a 60 y.o. female here today for follow up for OSA on CPAP.  She reports doing very well with therapy.  She definitely notes benefit.  Compliance report dated 01/03/2019 through 01/29/2019 reveals that she is using CPAP every night for compliance of 100%.  Every night she used CPAP greater than 4 hours.  Average usage was 8 hours and 43 minutes.  AHI was 1.0 on 5 to 12 cm of water and an EPR of 2.  There was no significant leak noted.  She is feeling well today and without complaints.  HISTORY: (copied from [place] note on 06/21/2017)  Alejandra Ray is a 60 year old female with a history of obstructive sleep apnea on CPAP.  She returns today for compliance download.  Her download indicates that she use her machine 30 out of 30 days for compliance of 100%.  She used her machine greater than 4 hours each night.  On average she uses her machine 7 hours and 51 minutes.  Her residual AHI is 1.5 on minimum pressure of 5 cm of water and maximum pressure of 12 cm of water with EPR of 2.  She does not have a leak with her mask.  Overall she reports that she is doing well.  She reports that her Epworth sleepiness score is slightly elevated  but this is due to her job.  She reports that her job is very strenuous.  She returns today for an evaluation.  HISTORY History from the eighth ofFebruary2018, Alejandra Ray is here for her yearly compliance visit which has been 100% over the last 30 days with average user time 8 hours and 20 minutes, she is using AutoSet CPAP between 5 and 12 cm water with a full-time expiratory pressure relief of 2 cm water and a residual AHI of 2.2 minimal air leaks using a nasal pillow, she has recovered from her in contact dermatitis that she first displayed, using just Vaseline to protect her skin. The 95th percentile pressure is 8.6 cm water.   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 She has found herself unemployed for 4 months now. She has started to sleep more as she doesn't have to get up early in the morning, and there is probably some component of depression   REVIEW OF SYSTEMS: Out of a complete 14 system review of symptoms, the patient complains only of the following symptoms, none and all other reviewed systems are negative.  Epworth sleepiness scale 7 FSS: 25  ALLERGIES:  Allergies  Allergen Reactions   Codeine Nausea And Vomiting    Blacked out   Other     PLASTICS   Sulfa Antibiotics Nausea And Vomiting    HOME MEDICATIONS: Outpatient Medications Prior to Visit  Medication Sig Dispense Refill   cyclobenzaprine (FLEXERIL) 5 MG tablet Take 1 tablet (5 mg total) by mouth daily as needed for muscle spasms. 30 tablet 1   glucose blood test strip Use to check blood sugars daily for type 2 diabetes 100 each prn   Lancet Devices (AUTO-LANCET) MISC Use to check blood sugars daily for type 2 diabetes - Dispense device compatible with glucometer 100 each 6   levothyroxine (SYNTHROID) 50 MCG tablet Take 1 tablet (50 mcg total) by mouth daily. 30 tablet 6   magnesium  30 MG tablet Take 30 mg by mouth at bedtime.     metFORMIN (GLUCOPHAGE) 1000 MG tablet Take 1 tablet (1,000 mg total) by mouth 2 (two) times daily with a meal. 180 tablet 3   MICROLET LANCETS MISC Use to check blood sugars daily for type 2 diabetes 100 each 3   milk thistle 175 MG tablet Take 175 mg by mouth daily.     Misc Natural Products (RED [place]OVER COMBINATION PO) Take 200 mg by mouth daily.     Multiple Vitamin (MULTIVITAMIN) capsule Take 1 capsule by mouth daily.     Omega-3 1000 MG CAPS Take 1 capsule by mouth daily.     rosuvastatin (CRESTOR) 10 MG tablet Take 1 tablet (10 mg total) by mouth daily. 30 tablet 6   vitamin B-12 (CYANOCOBALAMIN) 500 MCG tablet Take 500 mcg by mouth daily.     aspirin 81 MG tablet Take 81 mg by mouth every other day.      Bacillus Coagulans-Inulin (PROBIOTIC) 1-250 BILLION-MG CAPS Take 1 capsule by mouth at bedtime.     glipiZIDE (GLUCOTROL) 10 MG tablet Take 2 tablets (20 mg total) by mouth 2 (two) times daily before a meal. 360 tablet 6   No facility-administered medications prior to visit.    PAST MEDICAL HISTORY: Past Medical History:  Diagnosis Date   Allergic rhinitis    HSV-2 infection    Hyperlipidemia    Hypothyroidism    OSA on CPAP    Severe obesity (HCC)    SUI (stress urinary incontinence, female)    Type 2 diabetes mellitus (HCC)     PAST SURGICAL HISTORY: Past Surgical History:  Procedure Laterality Date   CHOLECYSTECTOMY     KNEE ARTHROSCOPY Right    TUBAL LIGATION Bilateral 2001    FAMILY HISTORY: Family History  Problem Relation Age of Onset   Diabetes Mother    Hyperlipidemia Mother    Hypertension Mother    Lung cancer Father        smoker   Hypertension Sister    Hypertension Sister    Testicular cancer Son    Hypertension Sister    Hypertension Sister     SOCIAL HISTORY: Social History   Socioeconomic History   Marital status: Married    Spouse name: Zeb Comfort    Number of children: 1   Years of education: College   Highest education level: Not on file  Occupational History   Occupation: PRODUCTION    Comment: [employer] ([employer])  Tobacco Use   Smoking status: Former Smoker    Types: Cigarettes    Quit date: 05/13/1996    Years since quitting: 24.0   Smokeless tobacco: Never Used  Vaping Use   Vaping Use: Never used  Substance and Sexual Activity   Alcohol use: Yes    Comment: Grey goose or beer. 3 drinks/wk   Drug use: Yes    Types: Marijuana    Comment: occassionally   Sexual activity: Never  Other Topics Concern   Not on file  Social History Narrative   Patient is married [name]) and lives at home with her husband.   Patient has one adult son who lives in [place].   Husband has 2 daughters.   Patient is right-handed.   Patient does not drink any caffeine.    Education: some college. Exercise: No   Social Determinants of Health   Financial Resource Strain: Not on file  Food Insecurity: Not on file  Transportation Needs: Not on file  Physical Activity: Not on file  Stress: Not on file  Social Connections: Not on file  Intimate Partner Violence: Not on file      PHYSICAL EXAM  Vitals:   05/20/20 1516  BP: 118/71  Pulse: 69  Weight: 212 lb (96.2 kg)  Height: 5\' 6"  (1.676 m)   Body mass index is 34.22 kg/m.  Generalized: Well developed, in no acute distress  Cardiology: normal rate and rhythm, no murmur noted Respiratory: Clear to auscultation bilaterally Mallampati 3+, neck circ 17.5" Neurological examination  Mentation: Alert oriented to time, place, history taking. Follows all commands speech and language fluent Cranial nerve II-XII: Pupils were equal round reactive to light. Extraocular movements were full, visual field were full on confrontational test. Facial sensation and strength were normal. Uvula tongue midline. Head turning and shoulder shrug  were normal and symmetric. Motor: The motor  testing reveals 5 over 5 strength of all 4 extremities. Good symmetric motor tone is noted throughout.  Sensory: Sensory testing is intact to soft touch on all 4 extremities. No evidence of extinction is noted.  Coordination: Cerebellar testing reveals good finger-nose-finger and heel-to-shin bilaterally.  Gait and station: Gait is normal  DIAGNOSTIC DATA (LABS, IMAGING, TESTING) - I reviewed patient records, labs, notes, testing and imaging myself where available.  No flowsheet data found.   Lab Results  Component Value Date   WBC 8.1 12/13/2018   HGB 13.9 12/13/2018   HCT 42.2 12/13/2018   MCV 90 12/13/2018   PLT 278 12/13/2018      Component Value Date/Time   NA 139 12/13/2018 1018   K 4.9 12/13/2018 1018   CL 101 12/13/2018 1018   CO2 23 12/13/2018 1018   GLUCOSE 171 (H) 12/13/2018 1018   GLUCOSE 358 (H) 03/18/2016 0907   BUN 14 12/13/2018 1018   CREATININE 0.77 12/13/2018 1018   CREATININE 0.66 03/18/2016 0907   CALCIUM 10.1 12/13/2018 1018   PROT 7.8 12/13/2018 1018   ALBUMIN 5.2 (H) 12/13/2018 1018   AST 32 12/13/2018 1018   ALT 47 (H) 12/13/2018 1018   ALKPHOS 45 12/13/2018 1018   BILITOT 0.5 12/13/2018 1018   GFRNONAA 84 12/13/2018 1018   GFRNONAA >89 01/24/2014 1042   GFRAA 97 12/13/2018 1018   GFRAA >89 01/24/2014 1042   Lab Results  Component Value Date   CHOL 136 12/13/2018   HDL 59 12/13/2018   LDLCALC 61 12/13/2018   LDLDIRECT 105 (H) 03/24/2012   TRIG 79 12/13/2018   CHOLHDL 2.3 12/13/2018   Lab Results  Component Value Date   HGBA1C 7.3 (H) 12/13/2018   Lab Results  Component Value Date   VITAMINB12 >2000 (H) 01/24/2015  Lab Results  Component Value Date   TSH 2.890 12/13/2018     ASSESSMENT AND PLAN 60 y.o. year old female  has a past medical history of Allergic rhinitis, HSV-2 infection, Hyperlipidemia, Hypothyroidism, OSA on CPAP, Severe obesity (HCC), SUI (stress urinary incontinence, female), and Type 2 diabetes mellitus (HCC).  here with     ICD-10-CM   1. OSA on CPAP  G47.33    Z99.89     Gabbie is doing very well on CPAP therapy.  Compliance report reveals excellent compliance.  She was encouraged to continue CPAP nightly and for greater than 4 hours each night.  She will follow-up with [place] annually, sooner if needed.  She verbalizes understanding and agreement with this plan.   No orders of the defined types were placed in this encounter.    No orders of the defined types were placed in this encounter.     I spent 15 minutes with the patient. 50% of this time was spent counseling and educating patient on plan of care and medications.    Shawnie Dapper, FNP-C 05/20/2020, 3:29 PM [facility] [address] [place], [place] [address] [phone]

## 2020-05-20 ENCOUNTER — Telehealth: Payer: Self-pay | Admitting: Family Medicine

## 2020-05-20 ENCOUNTER — Other Ambulatory Visit: Payer: Self-pay

## 2020-05-20 ENCOUNTER — Ambulatory Visit (INDEPENDENT_AMBULATORY_CARE_PROVIDER_SITE_OTHER): Payer: Managed Care, Other (non HMO) | Admitting: Family Medicine

## 2020-05-20 ENCOUNTER — Encounter: Payer: Self-pay | Admitting: Family Medicine

## 2020-05-20 VITALS — BP 118/71 | HR 69 | Ht 66.0 in | Wt 212.0 lb

## 2020-05-20 DIAGNOSIS — Z9989 Dependence on other enabling machines and devices: Secondary | ICD-10-CM | POA: Diagnosis not present

## 2020-05-20 DIAGNOSIS — G4733 Obstructive sleep apnea (adult) (pediatric): Secondary | ICD-10-CM

## 2020-05-20 NOTE — Telephone Encounter (Signed)
Pt. is requesting a telephone call for her appt. today. Please leave a vm to let her know if she can do this or not.

## 2020-05-20 NOTE — Telephone Encounter (Signed)
LMVM for pt that ok to do mychart VV and made appt to this.  She is to call back if issue.  Noted last active 04/18/2020.

## 2021-05-19 ENCOUNTER — Telehealth: Payer: Self-pay

## 2021-05-19 NOTE — Telephone Encounter (Signed)
LVM for pt to bring CPAP/power cord to appt for manual DL. Not able to find any date past 08/21/20.

## 2021-05-20 ENCOUNTER — Encounter: Payer: Self-pay | Admitting: Family Medicine

## 2021-05-20 ENCOUNTER — Ambulatory Visit (INDEPENDENT_AMBULATORY_CARE_PROVIDER_SITE_OTHER): Payer: Managed Care, Other (non HMO) | Admitting: Family Medicine

## 2021-05-20 ENCOUNTER — Other Ambulatory Visit: Payer: Self-pay

## 2021-05-20 VITALS — BP 126/76 | HR 96 | Ht 66.0 in | Wt 206.0 lb

## 2021-05-20 DIAGNOSIS — Z9989 Dependence on other enabling machines and devices: Secondary | ICD-10-CM

## 2021-05-20 DIAGNOSIS — G4733 Obstructive sleep apnea (adult) (pediatric): Secondary | ICD-10-CM | POA: Diagnosis not present

## 2021-05-20 NOTE — Patient Instructions (Addendum)
Please continue using your CPAP regularly. While your insurance requires that you use CPAP at least 4 hours each night on 70% of the nights, I recommend, that you not skip any nights and use it throughout the night if you can. Getting used to CPAP and staying with the treatment long term does take time and patience and discipline. Untreated obstructive sleep apnea when it is moderate to severe can have an adverse impact on cardiovascular health and raise her risk for heart disease, arrhythmias, hypertension, congestive heart failure, stroke and diabetes. Untreated obstructive sleep apnea causes sleep disruption, nonrestorative sleep, and sleep deprivation. This can have an impact on your day to day functioning and cause daytime sleepiness and impairment of cognitive function, memory loss, mood disturbance, and problems focussing. Using CPAP regularly can improve these symptoms.   Follow up with me in 1 year  

## 2021-05-20 NOTE — Progress Notes (Signed)
PATIENT: Alejandra Ray DOB: 07-24-1960  REASON FOR VISIT: follow up HISTORY FROM: patient  Chief Complaint  Patient presents with   Follow-up    Pt alone, rm 11. Presents today cpap f/u. Overall states things are well with CPAP. No issues or concerns. DME: [facility]      HISTORY OF PRESENT ILLNESS: 05/20/21 ALL: Alejandra Ray returns for follow up for OSA on CPAP. She continues to do well. She is using CPAP nightly. She does feel that she sleeps better using CPAP. Her machine is about 63 years old. She feels that it works fine and is not able to afford a new machine at this time. No concerns with machine or supplies. She continues to work full time. She is feeling well, today.   Last sleep study 2015.     05/20/2020 ALL: She is here today for CPAP follow-up.  She continues to do very well with CPAP therapy.  She denies difficulty with her machine or getting supplies.  Compliance report dated 04/16/2020 through 05/15/2020 reveals that she used CPAP 30 of the past 30 days for compliance of 100%.  She is CPAP greater than 4 hours all 30 days.  Average usage was 8 hours and 10 minutes.  Residual AHI was 0.9 on 5 to 12 cm of water and an EPR of two.  There was no leak noted.  History (copied from previous note) 02/06/2019 Alejandra Ray is a 61 y.o. female here today for follow up for OSA on CPAP.  She reports doing very well with therapy.  She definitely notes benefit.  Compliance report dated 01/03/2019 through 01/29/2019 reveals that she is using CPAP every night for compliance of 100%.  Every night she used CPAP greater than 4 hours.  Average usage was 8 hours and 43 minutes.  AHI was 1.0 on 5 to 12 cm of water and an EPR of 2.  There was no significant leak noted.  She is feeling well today and without complaints.  HISTORY: (copied from [place] note on 06/21/2017)  Alejandra Ray is a 61 year old female with a history of obstructive sleep apnea on CPAP.  She returns today  for compliance download.  Her download indicates that she use her machine 30 out of 30 days for compliance of 100%.  She used her machine greater than 4 hours each night.  On average she uses her machine 7 hours and 51 minutes.  Her residual AHI is 1.5 on minimum pressure of 5 cm of water and maximum pressure of 12 cm of water with EPR of 2.  She does not have a leak with her mask.  Overall she reports that she is doing well.  She reports that her Epworth sleepiness score is slightly elevated but this is due to her job.  She reports that her job is very strenuous.  She returns today for an evaluation.   HISTORY History from the eighth of February  2018,  Alejandra Ray is here for her yearly compliance visit which has been 100% over the last 30 days with average user time 8 hours and 20 minutes, she is using AutoSet CPAP between 5 and 12 cm water with a full-time expiratory pressure relief of 2 cm water and a residual AHI of 2.2 minimal air leaks using a nasal pillow, she has recovered from her in contact dermatitis that she first displayed, using just Vaseline to protect her skin. The 95th percentile pressure is 8.6 cm water.  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 She has found herself unemployed for 4 months now. She has started to sleep more as she doesn't have to get up early in the morning, and there is probably some component of depression   REVIEW OF SYSTEMS: Out of a complete 14 system review of symptoms, the patient complains only of the following symptoms, none and all other reviewed systems are negative.  Epworth sleepiness scale 7 FSS: 25  ALLERGIES: Allergies  Allergen Reactions   Codeine Nausea And Vomiting    Blacked out   Other     PLASTICS   Sulfa Antibiotics Nausea And Vomiting    HOME MEDICATIONS: Outpatient Medications Prior to Visit  Medication Sig  Dispense Refill   cyclobenzaprine (FLEXERIL) 5 MG tablet Take 1 tablet (5 mg total) by mouth daily as needed for muscle spasms. 30 tablet 1   glucose blood test strip Use to check blood sugars daily for type 2 diabetes 100 each prn   Lancet Devices (AUTO-LANCET) MISC Use to check blood sugars daily for type 2 diabetes - Dispense device compatible with glucometer 100 each 6   levothyroxine (SYNTHROID) 50 MCG tablet Take 1 tablet (50 mcg total) by mouth daily. 30 tablet 6   magnesium 30 MG tablet Take 30 mg by mouth at bedtime.     metFORMIN (GLUCOPHAGE) 1000 MG tablet Take 1 tablet (1,000 mg total) by mouth 2 (two) times daily with a meal. 180 tablet 3   MICROLET LANCETS MISC Use to check blood sugars daily for type 2 diabetes 100 each 3   milk thistle 175 MG tablet Take 175 mg by mouth daily.     Misc Natural Products (RED CLOVER COMBINATION PO) Take 200 mg by mouth daily.     Multiple Vitamin (MULTIVITAMIN) capsule Take 1 capsule by mouth daily.     Omega-3 1000 MG CAPS Take 1 capsule by mouth daily.     rosuvastatin (CRESTOR) 10 MG tablet Take 1 tablet (10 mg total) by mouth daily. 30 tablet 6   vitamin B-12 (CYANOCOBALAMIN) 500 MCG tablet Take 500 mcg by mouth daily.     No facility-administered medications prior to visit.    PAST MEDICAL HISTORY: Past Medical History:  Diagnosis Date   Allergic rhinitis    HSV-2 infection    Hyperlipidemia    Hypothyroidism    OSA on CPAP    Severe obesity (HCC)    SUI (stress urinary incontinence, female)    Type 2 diabetes mellitus ([place])     PAST SURGICAL HISTORY: Past Surgical History:  Procedure Laterality Date   CHOLECYSTECTOMY     KNEE ARTHROSCOPY Right    TUBAL LIGATION Bilateral 2001    FAMILY HISTORY: Family History  Problem Relation Age of Onset   Diabetes Mother    Hyperlipidemia Mother    Hypertension Mother    Lung cancer Father        smoker   Hypertension Sister    Hypertension Sister    Testicular cancer Son     Hypertension Sister    Hypertension Sister     SOCIAL HISTORY: Social History   Socioeconomic History   Marital status: Married    Spouse name: Janell Quiet   Number of children: 1   Years of education: College   Highest education level: Not on file  Occupational History   Occupation: PRODUCTION  Comment: [employer] ([employer])  Tobacco Use   Smoking status: Former    Types: Cigarettes    Quit date: 05/13/1996    Years since quitting: 25.0   Smokeless tobacco: Never  Vaping Use   Vaping Use: Never used  Substance and Sexual Activity   Alcohol use: Yes    Comment: Grey goose or beer. 3 drinks/wk   Drug use: Yes    Types: Marijuana    Comment: occassionally   Sexual activity: Never  Other Topics Concern   Not on file  Social History Narrative   Patient is married Shanon Brow) and lives at home with her husband.   Patient has one adult son who lives in [place].   Husband has 2 daughters.   Patient is right-handed.   Patient does not drink any caffeine.    Education: some college. Exercise: No   Social Determinants of Health   Financial Resource Strain: Not on file  Food Insecurity: Not on file  Transportation Needs: Not on file  Physical Activity: Not on file  Stress: Not on file  Social Connections: Not on file  Intimate Partner Violence: Not on file      PHYSICAL EXAM  Vitals:   05/20/21 1545  BP: 126/76  Pulse: 96  Weight: 206 lb (93.4 kg)  Height: 5\' 6"  (1.676 m)    Body mass index is 33.25 kg/m.  Generalized: Well developed, in no acute distress  Cardiology: normal rate and rhythm, no murmur noted Respiratory: Clear to auscultation bilaterally Mallampati 3+, neck circ 17.5" Neurological examination  Mentation: Alert oriented to time, place, history taking. Follows all commands speech and language fluent Cranial nerve II-XII: Pupils were equal round reactive to light. Extraocular movements were full, visual field were full on confrontational  test. Facial sensation and strength were normal. Uvula tongue midline. Head turning and shoulder shrug  were normal and symmetric. Motor: The motor testing reveals 5 over 5 strength of all 4 extremities. Good symmetric motor tone is noted throughout.  Gait and station: Gait is normal  DIAGNOSTIC DATA (LABS, IMAGING, TESTING) - I reviewed patient records, labs, notes, testing and imaging myself where available.  No flowsheet data found.   Lab Results  Component Value Date   WBC 8.1 12/13/2018   HGB 13.9 12/13/2018   HCT 42.2 12/13/2018   MCV 90 12/13/2018   PLT 278 12/13/2018      Component Value Date/Time   NA 139 12/13/2018 1018   K 4.9 12/13/2018 1018   CL 101 12/13/2018 1018   CO2 23 12/13/2018 1018   GLUCOSE 171 (H) 12/13/2018 1018   GLUCOSE 358 (H) 03/18/2016 0907   BUN 14 12/13/2018 1018   CREATININE 0.77 12/13/2018 1018   CREATININE 0.66 03/18/2016 0907   CALCIUM 10.1 12/13/2018 1018   PROT 7.8 12/13/2018 1018   ALBUMIN 5.2 (H) 12/13/2018 1018   AST 32 12/13/2018 1018   ALT 47 (H) 12/13/2018 1018   ALKPHOS 45 12/13/2018 1018   BILITOT 0.5 12/13/2018 1018   GFRNONAA 84 12/13/2018 1018   GFRNONAA >89 01/24/2014 1042   GFRAA 97 12/13/2018 1018   GFRAA >89 01/24/2014 1042   Lab Results  Component Value Date   CHOL 136 12/13/2018   HDL 59 12/13/2018   LDLCALC 61 12/13/2018   LDLDIRECT 105 (H) 03/24/2012   TRIG 79 12/13/2018   CHOLHDL 2.3 12/13/2018   Lab Results  Component Value Date   HGBA1C 7.3 (H) 12/13/2018   Lab Results  Component  Value Date   VITAMINB12 >2000 (H) 01/24/2015   Lab Results  Component Value Date   TSH 2.890 12/13/2018     ASSESSMENT AND PLAN 61 y.o. year old female  has a past medical history of Allergic rhinitis, HSV-2 infection, Hyperlipidemia, Hypothyroidism, OSA on CPAP, Severe obesity ([place]), SUI (stress urinary incontinence, female), and Type 2 diabetes mellitus ([place]). here with     ICD-10-CM   1. OSA on CPAP  G47.33 For  home use only DME continuous positive airway pressure (CPAP)   Z99.89        Sharnell is doing very well on CPAP therapy.  Compliance report reveals excellent compliance.  She was encouraged to continue CPAP nightly and for greater than 4 hours each night.  Supply order updated. She may call for new CPAP orders if needed. She does not wish to replace machine at this time. She will follow-up with [place] annually, sooner if needed.  She verbalizes understanding and agreement with this plan.   Orders Placed This Encounter  Procedures   For home use only DME continuous positive airway pressure (CPAP)    Supplies    Order Specific Question:   Length of Need    Answer:   [facility]    Order Specific Question:   Patient has OSA or probable OSA    Answer:   Yes    Order Specific Question:   Is the patient currently using CPAP in the home    Answer:   Yes    Order Specific Question:   Settings    Answer:   Other see comments    Order Specific Question:   CPAP supplies needed    Answer:   Mask, headgear, cushions, filters, heated tubing and water chamber     No orders of the defined types were placed in this encounter.     Debbora Presto, FNP-C 05/20/2021, 3:59 PM [facility] [address], [place], [place] [address] [phone]

## 2022-05-21 NOTE — Progress Notes (Signed)
PATIENT: Alejandra Ray DOB: 30-Oct-1960  REASON FOR VISIT: follow up HISTORY FROM: patient  Chief Complaint  Patient presents with   Room 2    Pt is here Alone. Pt states that everything is going good with CPAP. Pt states  that she has been unable to get a new machine.     HISTORY OF PRESENT ILLNESS:  05/26/22 ALL: Alejandra Ray returns for follow up for OSA on CPAP. She continues to do well. She is using CPAP nightly for about 8 hours. She has been receiving an error message on her machine for the past 6 months stating it needs servicing. She has reached out to DME but reports not hearing back. Machine is approx 64 years old. Last sleep study 2015. She is sleeping well and has not noticed any specific concerns with her CPAP. No data is available for review on [facility].    05/20/2021 ALL: Alejandra Ray returns for follow up for OSA on CPAP. She continues to do well. She is using CPAP nightly. She does feel that she sleeps better using CPAP. Her machine is about 64 years old. She feels that it works fine and is not able to afford a new machine at this time. No concerns with machine or supplies. She continues to work full time. She is feeling well, today.   Last sleep study 2015.     05/20/2020 ALL: She is here today for CPAP follow-up.  She continues to do very well with CPAP therapy.  She denies difficulty with her machine or getting supplies.  Compliance report dated 04/16/2020 through 05/15/2020 reveals that she used CPAP 30 of the past 30 days for compliance of 100%.  She is CPAP greater than 4 hours all 30 days.  Average usage was 8 hours and 10 minutes.  Residual AHI was 0.9 on 5 to 12 cm of water and an EPR of two.  There was no leak noted.  History (copied from previous note) 02/06/2019 Alejandra Ray is a 62 y.o. female here today for follow up for OSA on CPAP.  She reports doing very well with therapy.  She definitely notes benefit.  Compliance report dated 01/03/2019 through 01/29/2019  reveals that she is using CPAP every night for compliance of 100%.  Every night she used CPAP greater than 4 hours.  Average usage was 8 hours and 43 minutes.  AHI was 1.0 on 5 to 12 cm of water and an EPR of 2.  There was no significant leak noted.  She is feeling well today and without complaints.  HISTORY: (copied from [place] note on 06/21/2017)  Alejandra Ray is a 62 year old female with a history of obstructive sleep apnea on CPAP.  She returns today for compliance download.  Her download indicates that she use her machine 30 out of 30 days for compliance of 100%.  She used her machine greater than 4 hours each night.  On average she uses her machine 7 hours and 51 minutes.  Her residual AHI is 1.5 on minimum pressure of 5 cm of water and maximum pressure of 12 cm of water with EPR of 2.  She does not have a leak with her mask.  Overall she reports that she is doing well.  She reports that her Epworth sleepiness score is slightly elevated but this is due to her job.  She reports that her job is very strenuous.  She returns today for an evaluation.   HISTORY History from the eighth of  February  2018,  Alejandra Ray is here for her yearly compliance visit which has been 100% over the last 30 days with average user time 8 hours and 20 minutes, she is using AutoSet CPAP between 5 and 12 cm water with a full-time expiratory pressure relief of 2 cm water and a residual AHI of 2.2 minimal air leaks using a nasal pillow, she has recovered from her in contact dermatitis that she first displayed, using just Vaseline to protect her skin. The 95th percentile pressure is 8.6 cm water.    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 She has found herself unemployed for 4 months now. She has started to sleep more as she doesn't have to get up early in the morning, and there is probably some component  of depression   REVIEW OF SYSTEMS: Out of a complete 14 system review of symptoms, the patient complains only of the following symptoms, none and all other reviewed systems are negative.  ESS: 7/24  ALLERGIES: Allergies  Allergen Reactions   Codeine Nausea And Vomiting    Blacked out   Other     PLASTICS   Sulfa Antibiotics Nausea And Vomiting    HOME MEDICATIONS: Outpatient Medications Prior to Visit  Medication Sig Dispense Refill   cyclobenzaprine (FLEXERIL) 5 MG tablet Take 1 tablet (5 mg total) by mouth daily as needed for muscle spasms. 30 tablet 1   glucose blood test strip Use to check blood sugars daily for type 2 diabetes 100 each prn   Lancet Devices (AUTO-LANCET) MISC Use to check blood sugars daily for type 2 diabetes - Dispense device compatible with glucometer 100 each 6   levothyroxine (SYNTHROID) 50 MCG tablet Take 1 tablet (50 mcg total) by mouth daily. 30 tablet 6   magnesium 30 MG tablet Take 30 mg by mouth at bedtime.     metFORMIN (GLUCOPHAGE) 1000 MG tablet Take 1 tablet (1,000 mg total) by mouth 2 (two) times daily with a meal. 180 tablet 3   MICROLET LANCETS MISC Use to check blood sugars daily for type 2 diabetes 100 each 3   milk thistle 175 MG tablet Take 175 mg by mouth daily.     Misc Natural Products (RED CLOVER COMBINATION PO) Take 200 mg by mouth daily.     Multiple Vitamin (MULTIVITAMIN) capsule Take 1 capsule by mouth daily.     Omega-3 1000 MG CAPS Take 1 capsule by mouth daily.     rosuvastatin (CRESTOR) 10 MG tablet Take 1 tablet (10 mg total) by mouth daily. 30 tablet 6   Semaglutide (OZEMPIC, 1 MG/DOSE, [place]) Inject 1 mg into the skin once a week.     vitamin B-12 (CYANOCOBALAMIN) 500 MCG tablet Take 500 mcg by mouth daily.     No facility-administered medications prior to visit.    PAST MEDICAL HISTORY: Past Medical History:  Diagnosis Date   Allergic rhinitis    HSV-2 infection    Hyperlipidemia    Hypothyroidism    OSA on CPAP     Severe obesity (HCC)    SUI (stress urinary incontinence, female)    Type 2 diabetes mellitus (HCC)     PAST SURGICAL HISTORY: Past Surgical History:  Procedure Laterality Date   CHOLECYSTECTOMY     KNEE ARTHROSCOPY Right    TUBAL LIGATION Bilateral 2001    FAMILY HISTORY: Family History  Problem  Relation Age of Onset   Diabetes Mother    Hyperlipidemia Mother    Hypertension Mother    Lung cancer Father        smoker   Hypertension Sister    Hypertension Sister    Testicular cancer Son    Hypertension Sister    Hypertension Sister     SOCIAL HISTORY: Social History   Socioeconomic History   Marital status: Married    Spouse name: Zeb Comfort   Number of children: 1   Years of education: College   Highest education level: Not on file  Occupational History   Occupation: PRODUCTION    Comment: [employer] ([employer])  Tobacco Use   Smoking status: Former    Types: Cigarettes    Quit date: 05/13/1996    Years since quitting: 26.0   Smokeless tobacco: Never  Vaping Use   Vaping Use: Never used  Substance and Sexual Activity   Alcohol use: Yes    Comment: Grey goose or beer. 3 drinks/wk   Drug use: Yes    Types: Marijuana    Comment: occassionally   Sexual activity: Never  Other Topics Concern   Not on file  Social History Narrative   Patient is married [name]) and lives at home with her husband.   Patient has one adult son who lives in [place].   Husband has 2 daughters.   Patient is right-handed.   Patient does not drink any caffeine.    Education: some college. Exercise: No   Social Determinants of Health   Financial Resource Strain: Not on file  Food Insecurity: Not on file  Transportation Needs: Not on file  Physical Activity: Not on file  Stress: Not on file  Social Connections: Not on file  Intimate Partner Violence: Not on file      PHYSICAL EXAM  Vitals:   05/26/22 1510  BP: 130/81  Pulse: 71  Weight: 208 lb (94.3 kg)   Height: 5\' 6"  (1.676 m)     Body mass index is 33.57 kg/m.  Generalized: Well developed, in no acute distress  Cardiology: normal rate and rhythm, no murmur noted Respiratory: Clear to auscultation bilaterally Mallampati 3+, neck circ 17.5" Neurological examination  Mentation: Alert oriented to time, place, history taking. Follows all commands speech and language fluent Cranial nerve II-XII: Pupils were equal round reactive to light. Extraocular movements were full, visual field were full on confrontational test. Facial sensation and strength were normal. Uvula tongue midline. Head turning and shoulder shrug  were normal and symmetric. Motor: The motor testing reveals 5 over 5 strength of all 4 extremities. Good symmetric motor tone is noted throughout.  Gait and station: Gait is normal  DIAGNOSTIC DATA (LABS, IMAGING, TESTING) - I reviewed patient records, labs, notes, testing and imaging myself where available.      No data to display           Lab Results  Component Value Date   WBC 8.1 12/13/2018   HGB 13.9 12/13/2018   HCT 42.2 12/13/2018   MCV 90 12/13/2018   PLT 278 12/13/2018      Component Value Date/Time   NA 139 12/13/2018 1018   K 4.9 12/13/2018 1018   CL 101 12/13/2018 1018   CO2 23 12/13/2018 1018   GLUCOSE 171 (H) 12/13/2018 1018   GLUCOSE 358 (H) 03/18/2016 0907   BUN 14 12/13/2018 1018   CREATININE 0.77 12/13/2018 1018   CREATININE 0.66 03/18/2016 0907   CALCIUM  10.1 12/13/2018 1018   PROT 7.8 12/13/2018 1018   ALBUMIN 5.2 (H) 12/13/2018 1018   AST 32 12/13/2018 1018   ALT 47 (H) 12/13/2018 1018   ALKPHOS 45 12/13/2018 1018   BILITOT 0.5 12/13/2018 1018   GFRNONAA 84 12/13/2018 1018   GFRNONAA >89 01/24/2014 1042   GFRAA 97 12/13/2018 1018   GFRAA >89 01/24/2014 1042   Lab Results  Component Value Date   CHOL 136 12/13/2018   HDL 59 12/13/2018   LDLCALC 61 12/13/2018   LDLDIRECT 105 (H) 03/24/2012   TRIG 79 12/13/2018   CHOLHDL 2.3  12/13/2018   Lab Results  Component Value Date   HGBA1C 7.3 (H) 12/13/2018   Lab Results  Component Value Date   VITAMINB12 >2000 (H) 01/24/2015   Lab Results  Component Value Date   TSH 2.890 12/13/2018     ASSESSMENT AND PLAN 62 y.o. year old female  has a past medical history of Allergic rhinitis, HSV-2 infection, Hyperlipidemia, Hypothyroidism, OSA on CPAP, Severe obesity ([place]), SUI (stress urinary incontinence, female), and Type 2 diabetes mellitus ([place]). here with     ICD-10-CM   1. OSA on CPAP  G47.33 For home use only DME continuous positive airway pressure (CPAP)    Home sleep test       Alejandra Ray is doing very well on CPAP therapy. Unfortunately, she does not have current data in [place] and did not bring her machine. She reports getting a service error message on her machine for past 6 months. Current machine around 64 years old. We will place orders for HST. Pending results I will order her a new CPAP machine. She was encouraged to continue CPAP nightly and for greater than 4 hours each night.  Supply order updated. She will follow-up with me in 31-90 days from set up of new machine then annually. She verbalizes understanding and agreement with this plan.   Orders Placed This Encounter  Procedures   For home use only DME continuous positive airway pressure (CPAP)    Supplies    Order Specific Question:   Length of Need    Answer:   Lifetime    Order Specific Question:   Patient has OSA or probable OSA    Answer:   Yes    Order Specific Question:   Is the patient currently using CPAP in the home    Answer:   Yes    Order Specific Question:   Settings    Answer:   Other see comments    Order Specific Question:   CPAP supplies needed    Answer:   Mask, headgear, cushions, filters, heated tubing and water chamber   Home sleep test    Standing Status:   Future    Standing Expiration Date:   05/27/2023    Order Specific Question:   Where should this test be performed:     Answer:   [facility]     No orders of the defined types were placed in this encounter.     Debbora Presto, FNP-C 05/26/2022, 3:32 PM [facility] [address] [place], [place] [address] [phone]

## 2022-05-26 ENCOUNTER — Ambulatory Visit: Payer: Managed Care, Other (non HMO) | Admitting: Family Medicine

## 2022-05-26 ENCOUNTER — Encounter: Payer: Self-pay | Admitting: Family Medicine

## 2022-05-26 VITALS — BP 130/81 | HR 71 | Ht 66.0 in | Wt 208.0 lb

## 2022-05-26 DIAGNOSIS — G4733 Obstructive sleep apnea (adult) (pediatric): Secondary | ICD-10-CM

## 2022-05-26 NOTE — Patient Instructions (Addendum)
Please continue using your CPAP regularly. While your insurance requires that you use CPAP at least 4 hours each night on 70% of the nights, I recommend, that you not skip any nights and use it throughout the night if you can. Getting used to CPAP and staying with the treatment long term does take time and patience and discipline. Untreated obstructive sleep apnea when it is moderate to severe can have an adverse impact on cardiovascular health and raise her risk for heart disease, arrhythmias, hypertension, congestive heart failure, stroke and diabetes. Untreated obstructive sleep apnea causes sleep disruption, nonrestorative sleep, and sleep deprivation. This can have an impact on your day to day functioning and cause daytime sleepiness and impairment of cognitive function, memory loss, mood disturbance, and problems focussing. Using CPAP regularly can improve these symptoms.   Follow up pending sleep study results. We will need to see you back 31-90 days following set up of new report.

## 2022-06-25 ENCOUNTER — Telehealth: Payer: Self-pay | Admitting: Family Medicine

## 2022-06-25 NOTE — Telephone Encounter (Signed)
06/23/22 [employer] no Alejandra Ray ref # [id number] EE  left VM 06/24/22 [place]

## 2022-06-30 DIAGNOSIS — K635 Polyp of colon: Secondary | ICD-10-CM | POA: Insufficient documentation

## 2023-03-27 ENCOUNTER — Emergency Department (HOSPITAL_COMMUNITY): Payer: Managed Care, Other (non HMO)

## 2023-03-27 ENCOUNTER — Emergency Department (HOSPITAL_COMMUNITY)
Admission: EM | Admit: 2023-03-27 | Discharge: 2023-03-28 | Disposition: A | Discharge status: Home / Self Care | Payer: Managed Care, Other (non HMO) | Attending: Emergency Medicine | Admitting: Emergency Medicine

## 2023-03-27 ENCOUNTER — Encounter (HOSPITAL_COMMUNITY): Payer: Self-pay | Admitting: Emergency Medicine

## 2023-03-27 ENCOUNTER — Other Ambulatory Visit: Payer: Self-pay

## 2023-03-27 DIAGNOSIS — H532 Diplopia: Secondary | ICD-10-CM | POA: Diagnosis not present

## 2023-03-27 DIAGNOSIS — R519 Headache, unspecified: Secondary | ICD-10-CM | POA: Diagnosis not present

## 2023-03-27 DIAGNOSIS — E119 Type 2 diabetes mellitus without complications: Secondary | ICD-10-CM | POA: Diagnosis not present

## 2023-03-27 DIAGNOSIS — E039 Hypothyroidism, unspecified: Secondary | ICD-10-CM | POA: Insufficient documentation

## 2023-03-27 DIAGNOSIS — H538 Other visual disturbances: Secondary | ICD-10-CM | POA: Insufficient documentation

## 2023-03-27 DIAGNOSIS — Z79899 Other long term (current) drug therapy: Secondary | ICD-10-CM | POA: Diagnosis not present

## 2023-03-27 LAB — DIFFERENTIAL
Abs Immature Granulocytes: 0.03 10*3/uL (ref 0.00–0.07)
Basophils Absolute: 0.1 10*3/uL (ref 0.0–0.1)
Basophils Relative: 1 %
Eosinophils Absolute: 0.4 10*3/uL (ref 0.0–0.5)
Eosinophils Relative: 4 %
Immature Granulocytes: 0 %
Lymphocytes Relative: 35 %
Lymphs Abs: 3 10*3/uL (ref 0.7–4.0)
Monocytes Absolute: 0.7 10*3/uL (ref 0.1–1.0)
Monocytes Relative: 8 %
Neutro Abs: 4.5 10*3/uL (ref 1.7–7.7)
Neutrophils Relative %: 52 %

## 2023-03-27 LAB — COMPREHENSIVE METABOLIC PANEL
ALT: 37 U/L (ref 0–44)
AST: 27 U/L (ref 15–41)
Albumin: 4.3 g/dL (ref 3.5–5.0)
Alkaline Phosphatase: 39 U/L (ref 38–126)
Anion gap: 11 (ref 5–15)
BUN: 14 mg/dL (ref 8–23)
CO2: 22 mmol/L (ref 22–32)
Calcium: 9.9 mg/dL (ref 8.9–10.3)
Chloride: 105 mmol/L (ref 98–111)
Creatinine, Ser: 0.65 mg/dL (ref 0.44–1.00)
GFR, Estimated: >60 mL/min (ref >60)
Glucose, Bld: 144 mg/dL — ABNORMAL HIGH (ref 70–99)
Potassium: 3.9 mmol/L (ref 3.5–5.1)
Sodium: 138 mmol/L (ref 135–145)
Total Bilirubin: 0.8 mg/dL (ref <1.2)
Total Protein: 7.7 g/dL (ref 6.5–8.1)

## 2023-03-27 LAB — I-STAT CHEM 8, ED
BUN: 15 mg/dL (ref 8–23)
Calcium, Ion: 1.09 mmol/L — ABNORMAL LOW (ref 1.15–1.40)
Chloride: 104 mmol/L (ref 98–111)
Creatinine, Ser: 0.7 mg/dL (ref 0.44–1.00)
Glucose, Bld: 144 mg/dL — ABNORMAL HIGH (ref 70–99)
HCT: 45 % (ref 36.0–46.0)
Hemoglobin: 15.3 g/dL — ABNORMAL HIGH (ref 12.0–15.0)
Potassium: 3.8 mmol/L (ref 3.5–5.1)
Sodium: 139 mmol/L (ref 135–145)
TCO2: 23 mmol/L (ref 22–32)

## 2023-03-27 LAB — CBG MONITORING, ED: Glucose-Capillary: 135 mg/dL — ABNORMAL HIGH (ref 70–99)

## 2023-03-27 LAB — CBC
HCT: 46.5 % — ABNORMAL HIGH (ref 36.0–46.0)
Hemoglobin: 15.2 g/dL — ABNORMAL HIGH (ref 12.0–15.0)
MCH: 29.5 pg (ref 26.0–34.0)
MCHC: 32.7 g/dL (ref 30.0–36.0)
MCV: 90.1 fL (ref 80.0–100.0)
Platelets: 264 10*3/uL (ref 150–400)
RBC: 5.16 MIL/uL — ABNORMAL HIGH (ref 3.87–5.11)
RDW: 13 % (ref 11.5–15.5)
WBC: 8.5 10*3/uL (ref 4.0–10.5)
nRBC: 0 % (ref 0.0–0.2)

## 2023-03-27 LAB — C-REACTIVE PROTEIN: CRP: 0.5 mg/dL (ref <1.0)

## 2023-03-27 LAB — PROTIME-INR
INR: 1 (ref 0.8–1.2)
Prothrombin Time: 13 s (ref 11.4–15.2)

## 2023-03-27 LAB — APTT: aPTT: 34 s (ref 24–36)

## 2023-03-27 LAB — ETHANOL: Alcohol, Ethyl (B): <10 mg/dL (ref <10)

## 2023-03-27 LAB — SEDIMENTATION RATE: Sed Rate: 9 mm/h (ref 0–22)

## 2023-03-27 MED ORDER — SODIUM CHLORIDE 0.9% FLUSH
3.0000 mL | Freq: Once | INTRAVENOUS | Status: AC
  Administered 2023-03-27: 3 mL via INTRAVENOUS

## 2023-03-27 MED ORDER — GADOBUTROL 1 MMOL/ML IV SOLN
8.0000 mL | Freq: Once | INTRAVENOUS | Status: AC | PRN
  Administered 2023-03-27: 8 mL via INTRAVENOUS

## 2023-03-27 NOTE — ED Notes (Signed)
RN attempted IV 2x.

## 2023-03-27 NOTE — ED Notes (Signed)
Pt in MRI at this time 

## 2023-03-27 NOTE — ED Triage Notes (Signed)
Pt reports right eye visual deficit (blindness) beginning last night about 5pm. Seen today by ophthalmology who suspects new stroke. No unilateral weakness, aphasia, neglect.

## 2023-03-27 NOTE — ED Notes (Signed)
Report received from [facility]. Assumed care of pt at this time.

## 2023-03-27 NOTE — ED Notes (Signed)
Pt in CT.

## 2023-03-27 NOTE — ED Provider Notes (Signed)
[place] EMERGENCY DEPARTMENT AT [facility] Provider Note   CSN: [id number] Arrival date & time: 03/27/23  1305     History  Chief Complaint  Patient presents with   Cerebrovascular Accident    Alejandra Ray is a 62 y.o. female.  The history is provided by the patient, medical records and a significant other. No language interpreter was used.  Eye Problem Location:  Right eye Severity:  Moderate Onset quality:  Gradual Duration:  2 days Timing:  Constant Progression:  Unchanged Chronicity:  New Relieved by:  Nothing Worsened by:  Nothing Associated symptoms: blurred vision, decreased vision, double vision (intermittent) and headaches (resolved now)   Associated symptoms: no discharge, no facial rash, no nausea, no numbness, no redness, no tingling, no vomiting and no weakness        Home Medications Prior to Admission medications   Medication Sig Start Date End Date Taking? Authorizing Provider  cyclobenzaprine (FLEXERIL) 5 MG tablet Take 1 tablet (5 mg total) by mouth daily as needed for muscle spasms. 12/13/18   Marcine Matar, MD  glucose blood test strip Use to check blood sugars daily for type 2 diabetes 06/11/17   Porfirio Oar, PA  Lancet Devices (AUTO-LANCET) MISC Use to check blood sugars daily for type 2 diabetes - Dispense device compatible with glucometer 04/22/16   Tobey Grim, MD  levothyroxine (SYNTHROID) 50 MCG tablet Take 1 tablet (50 mcg total) by mouth daily. 12/13/18   Marcine Matar, MD  magnesium 30 MG tablet Take 30 mg by mouth at bedtime.    [provider]  metFORMIN (GLUCOPHAGE) 1000 MG tablet Take 1 tablet (1,000 mg total) by mouth 2 (two) times daily with a meal. 12/14/18   Marcine Matar, MD  MICROLET LANCETS MISC Use to check blood sugars daily for type 2 diabetes 06/08/17   Porfirio Oar, PA  milk thistle 175 MG tablet Take 175 mg by mouth daily.    [provider]  Misc Natural Products (RED  CLOVER COMBINATION PO) Take 200 mg by mouth daily.    [provider]  Multiple Vitamin (MULTIVITAMIN) capsule Take 1 capsule by mouth daily.    [provider]  Omega-3 1000 MG CAPS Take 1 capsule by mouth daily.    [provider]  rosuvastatin (CRESTOR) 10 MG tablet Take 1 tablet (10 mg total) by mouth daily. 12/13/18   Marcine Matar, MD  Semaglutide (OZEMPIC, 1 MG/DOSE, [place]) Inject 1 mg into the skin once a week.    [provider]  vitamin B-12 (CYANOCOBALAMIN) 500 MCG tablet Take 500 mcg by mouth daily.    [provider]      Allergies    Codeine, Other, and Sulfa antibiotics    Review of Systems   Review of Systems  Constitutional:  Negative for chills, fatigue and fever.  Eyes:  Positive for blurred vision, double vision (intermittent), pain (resolved now) and visual disturbance. Negative for discharge and redness.  Respiratory:  Negative for cough, chest tightness, shortness of breath and wheezing.   Cardiovascular:  Negative for chest pain and palpitations.  Gastrointestinal:  Negative for abdominal pain, constipation, diarrhea, nausea and vomiting.  Genitourinary:  Negative for dysuria.  Musculoskeletal:  Negative for back pain and neck pain.  Neurological:  Positive for dizziness and headaches (resolved now). Negative for tingling, facial asymmetry, speech difficulty, weakness and numbness.  Psychiatric/Behavioral:  Negative for agitation.     Physical Exam Updated Vital  Signs BP (!) 145/84 (BP Location: Right Arm)   Pulse 80   Temp 98.1 F (36.7 C) (Oral)   Resp 17   Ht 5\' 6"  (1.676 m)   Wt 92.5 kg   LMP 08/31/2012   SpO2 97%   BMI 32.93 kg/m  Physical Exam Vitals and nursing note reviewed.  Constitutional:      General: She is not in acute distress.    Appearance: She is well-developed. She is not ill-appearing, toxic-appearing or diaphoretic.  HENT:     Head: Atraumatic.     Nose: No congestion or rhinorrhea.      Mouth/Throat:     Pharynx: No oropharyngeal exudate or posterior oropharyngeal erythema.  Eyes:     Extraocular Movements: Extraocular movements intact.     Conjunctiva/sclera: Conjunctivae normal.     Pupils: Pupils are equal, round, and reactive to light.     Comments: Blurry vision in right eye compared to left.  Extract movements are intact on my exam.  Pupil symmetric and reactive.  No rash to suggest shingles.  Normal visual fields on exam.  Cardiovascular:     Rate and Rhythm: Normal rate and regular rhythm.     Heart sounds: No murmur heard. Pulmonary:     Effort: Pulmonary effort is normal. No respiratory distress.     Breath sounds: Normal breath sounds.  Abdominal:     Palpations: Abdomen is soft.     Tenderness: There is no abdominal tenderness.  Musculoskeletal:        General: No swelling or tenderness.     Cervical back: Neck supple. No tenderness.  Skin:    General: Skin is warm and dry.     Capillary Refill: Capillary refill takes less than 2 seconds.     Findings: No erythema or rash.     Comments: No rash to indicate facial shingles   Neurological:     Mental Status: She is alert.     Sensory: No sensory deficit.     Motor: No weakness.     Coordination: Coordination normal.  Psychiatric:        Mood and Affect: Mood normal.      ED Results / Procedures / Treatments   Labs (all labs ordered are listed, but only abnormal results are displayed) Labs Reviewed  CBC - Abnormal; Notable for the following components:      Result Value   RBC 5.16 (*)    Hemoglobin 15.2 (*)    HCT 46.5 (*)    All other components within normal limits  COMPREHENSIVE METABOLIC PANEL - Abnormal; Notable for the following components:   Glucose, Bld 144 (*)    All other components within normal limits  I-STAT CHEM 8, ED - Abnormal; Notable for the following components:   Glucose, Bld 144 (*)    Calcium, Ion 1.09 (*)    Hemoglobin 15.3 (*)    All other components within  normal limits  CBG MONITORING, ED - Abnormal; Notable for the following components:   Glucose-Capillary 135 (*)    All other components within normal limits  PROTIME-INR  APTT  DIFFERENTIAL  ETHANOL    EKG None  Radiology CT HEAD WO CONTRAST ( )  Result Date: 03/27/2023 CLINICAL DATA:  Neuro deficit, acute, stroke suspected EXAM: CT HEAD WITHOUT CONTRAST TECHNIQUE: Contiguous axial images were obtained from the base of the skull through the vertex without intravenous contrast. RADIATION DOSE REDUCTION: This exam was performed according to the departmental dose-optimization program which  includes automated exposure control, adjustment of the mA and/or kV according to patient size and/or use of iterative reconstruction technique. COMPARISON:  None Available. FINDINGS: Brain: No evidence of acute large vascular territory infarction, hemorrhage, hydrocephalus, extra-axial collection or mass lesion/mass effect. Vascular: No hyperdense vessel. Skull: No acute fracture. Sinuses/Orbits: No acute finding. Other: No mastoid effusions. IMPRESSION: No evidence of acute intracranial abnormality. Electronically Signed   By: Feliberto Harts M.D.   On: 03/27/2023 14:49    Procedures Procedures    Medications Ordered in ED Medications  sodium chloride flush (NS) 0.9 % injection 3 mL (has no administration in time range)    ED Course/ Medical Decision Making/ A&P                                 Medical Decision Making Amount and/or Complexity of Data Reviewed Labs: ordered. Radiology: ordered.  Risk Prescription drug management.     ANYLIA MADSON is a 62 y.o. female with a past medical history significant for hypothyroidism, hyperlipidemia, diabetes, previous cholecystectomy, previous tube ligation, and sleep apnea who presents from ophthalmology for further evaluation of right eye vision changes.  According to patient, starting around Monday, 6 days ago, patient started having some  pain in her medial right orbit area.  She then reported having some pain behind the eye and some headache.  She reports that it resolved but yesterday she started having vision changes and dizziness.  She says that her right eye is blurry all over compared to left.  When she was driving she was not seeing as well and started getting unsteady and dizzy with ambulation.  She reports no nausea or vomiting.  Denies any neck pain or neck stiffness.  Denies fevers, chills, congestion, or cough.  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  Otherwise she has no complaints and denies any new numbness or weakness in extremities.  No speech troubles or hearing changes.  Of note, she does have family members who have had strokes and an uncle who had MS although she has no history of either of those.  On my exam, lungs clear.  Chest nontender.  Abdomen nontender.  Neck nontender.  No carotid bruit appreciated.  She has normal extract movements on my exam and pupils are symmetric and reactive.  She has blurriness in her right eye vision but no blurriness in her left eye's vision.  She did not have a field cut on my evaluation of either eye.  She did not have focal tenderness in the temporal area and has no history of temporal arteritis.  Patient otherwise had intact sensation, strength, and pulse in extremities.  Normal finger-nose-finger testing bilaterally.  Symmetric smile.  Clear speech.  She did not have a red shift in her right eye vision compared to left.  Clinically given her symptoms and her report that the ophthalmologist that she does not have glaucoma or any other intrinsic eye problem and today and no retinal problem, I am somewhat concerned about stroke versus mass versus MS versus some other optic neuritis.  Will get MRI brain with and without contrast to further evaluate.   She had a CT head without contrast in triage that did not show concerning findings.  Anticipate reassessment after workup to determine disposition.  8:43 PM MRI returned without evidence of acute stroke, mass, or MS.  Did show some microvascular changes.  Due to the persistent vision change, I did speak to neurology who recommended getting ESR and CRP given the pain in the right forehead and temporal area to help rule out temporal arteritis.  If ESR and CRP are elevated, they would recommend steroids and admission.  If it is negative, they recommend outpatient follow-up.  Patient is amenable to this plan.  Care transferred to oncoming team to await reassessment after ESR and CRP results. .        Final Clinical Impression(s) / ED Diagnoses Final diagnoses:  Blurry vision, right eye    Clinical Impression: 1. Blurry vision, right eye     Disposition: Care transferred to oncoming team to await reassessment after ESR and CRP results.  If reassuring, anticipate discharge home with outpatient follow-up.  This note was prepared with assistance of Conservation officer, historic buildings. Occasional wrong-word or sound-a-like substitutions may have occurred due to the inherent limitations of voice recognition software.     Jerene Yeager, Canary Brim, MD 03/27/23 [phone]

## 2023-03-28 NOTE — ED Notes (Signed)
ED Provider at bedside. 

## 2023-03-28 NOTE — ED Provider Notes (Signed)
Patient signed out to me by Dr. Rush Landmark.  Patient sent to the ED by ophthalmology.  Patient with difficulty with her vision in her right eye.  She reports difficulty with peripheral vision in the right eye that started while driving.  She was seen by ophthalmology and told " everything was okay with her eyeball" and that she needed to go to the emergency room with concern over Sixth nerve palsy and homonymous hemianopsia.   To my exam, I do not appreciate a nerve palsy.  Additionally, she does not have a hemianopsia, patient with difficulty on lateral peripheral vision in the right eye only, has normal vision in the left eye.  MRI without signs of stroke.  Sed rate and C-reactive protein normal, temporal arteritis ruled out.  Discussed again with neurology.  No other imaging or testing in the ED that would be helpful.  Will recommend follow-up with ophthalmology for monocular vision change.   Gilda Crease, MD 03/28/23 [phone]

## 2023-03-31 DIAGNOSIS — H4921 Sixth [abducent] nerve palsy, right eye: Secondary | ICD-10-CM | POA: Insufficient documentation

## 2023-05-24 ENCOUNTER — Encounter: Payer: Self-pay | Admitting: Family Medicine

## 2023-07-08 ENCOUNTER — Telehealth: Payer: Self-pay

## 2023-07-08 NOTE — Patient Instructions (Signed)

## 2023-07-08 NOTE — Telephone Encounter (Signed)
 See phone note

## 2023-07-08 NOTE — Telephone Encounter (Signed)
 Spoke to patient made VV  07/14/2023 with Amy,NP to discuss new HST order

## 2023-07-08 NOTE — Telephone Encounter (Signed)
 Patient called sleep lab and LVM to schedule her sleep study. Sleep study orders expired in January and since the order is over a year old the patient will need to complete an office visit and have new sleep study orders placed. Please call patient to schedule visit with Amy. Thank you!!

## 2023-07-08 NOTE — Progress Notes (Addendum)
 PATIENT: TEONNA COONAN DOB: [birth year]  REASON FOR VISIT: follow up HISTORY FROM: patient  Virtual Visit via Mychart  I connected with Caryl Bis on 07/14/23 at  8:45 AM EST via Mychart video and verified that I am speaking with the correct person using two identifiers.   I discussed the limitations, risks, security and privacy concerns of performing an evaluation and management service by telephone and the availability of in person appointments. I also discussed with the patient that there may be a patient responsible charge related to this service. The patient expressed understanding and agreed to proceed.   History of Present Illness:  07/14/23 ALL: ZOUA CAPORASO is a 63 y.o. female here today for follow up for OSA on CPAP. She was last seen 05/2022 and needing a new machine. HST ordered but not completed. She continues to do well on therapy. No concerns with machine or supplies. She is using therapy nightly for about 8 hours, on average. She is ready to get a new machine.     05/26/22 ALL: Arzu returns for follow up for OSA on CPAP. She continues to do well. She is using CPAP nightly for about 8 hours. She has been receiving an error message on her machine for the past 6 months stating it needs servicing. She has reached out to DME but reports not hearing back. Machine is approx 65 years old. Last sleep study 2015. She is sleeping well and has not noticed any specific concerns with her CPAP. No data is available for review on Resmed.   Observations/Objective:  Generalized: Well developed, in no acute distress  Mentation: Alert oriented to time, place, history taking. Follows all commands speech and language fluent   Assessment and Plan:  63 y.o. year old female  has a past medical history of Allergic rhinitis, HSV-2 infection, Hyperlipidemia, Hypothyroidism, OSA on CPAP, Severe obesity (HCC), SUI (stress urinary incontinence, female), and Type 2 diabetes  mellitus (HCC). here with    ICD-10-CM   1. OSA on CPAP  G47.33 Home sleep test    For home use only DME continuous positive airway pressure (CPAP)      Charmayne is doing well on CPAP therapy. Compliance report shows excellent compliance. She was encouraged to continue CPAP nightly for at least 4 hours. We will reorder HST. Pending results, we will order a new machine. Once new machine is received, we will follow up in 31-90 days.   Orders Placed This Encounter  Procedures   For home use only DME continuous positive airway pressure (CPAP)    Heated Humidity with all supplies as needed    Length of Need:   Lifetime    Patient has OSA or probable OSA:   Yes    Is the patient currently using CPAP in the home:   Yes    Settings:   Other see comments    CPAP supplies needed:   Mask, headgear, cushions, filters, heated tubing and water chamber   Home sleep test    Standing Status:   Future    Expiration Date:   07/07/2024    Where should this test be performed::   [facility]    No orders of the defined types were placed in this encounter.    Follow Up Instructions:  I discussed the assessment and treatment plan with the patient. The patient was provided an opportunity to ask questions and all were answered. The patient agreed with the plan and  demonstrated an understanding of the instructions.   The patient was advised to call back or seek an in-person evaluation if the symptoms worsen or if the condition fails to improve as anticipated.  I provided 15 minutes of face-to-face time during this MyChart video encounter. Patient located at their place of residence during Mychart visit. Provider is in the office.    Shawnie Dapper, NP

## 2023-07-14 ENCOUNTER — Telehealth (INDEPENDENT_AMBULATORY_CARE_PROVIDER_SITE_OTHER): Payer: Managed Care, Other (non HMO) | Admitting: Family Medicine

## 2023-07-14 ENCOUNTER — Encounter: Payer: Self-pay | Admitting: Family Medicine

## 2023-07-14 ENCOUNTER — Telehealth: Payer: Self-pay

## 2023-07-14 DIAGNOSIS — G4733 Obstructive sleep apnea (adult) (pediatric): Secondary | ICD-10-CM | POA: Diagnosis not present

## 2023-07-14 NOTE — Telephone Encounter (Signed)
  Cpap order placed through community msg

## 2023-07-22 ENCOUNTER — Telehealth: Payer: Self-pay | Admitting: Family Medicine

## 2023-07-22 NOTE — Telephone Encounter (Signed)
 LVM for pt to call back to schedule   HST [employer] medicare no auth req via website

## 2023-08-17 ENCOUNTER — Ambulatory Visit (INDEPENDENT_AMBULATORY_CARE_PROVIDER_SITE_OTHER): Admitting: Neurology

## 2023-08-17 DIAGNOSIS — G4733 Obstructive sleep apnea (adult) (pediatric): Secondary | ICD-10-CM | POA: Diagnosis not present

## 2023-08-17 DIAGNOSIS — Z9989 Dependence on other enabling machines and devices: Secondary | ICD-10-CM

## 2023-08-17 DIAGNOSIS — I495 Sick sinus syndrome: Secondary | ICD-10-CM

## 2023-08-18 NOTE — Progress Notes (Signed)
 [facility]  Alejandra Ray 63 year old female 1960/12/09   HOME SLEEP TEST REPORT ( by Watch PAT)   STUDY DATE:  08-17-2023    ORDERING CLINICIAN: Neomia Banner, MD  REFERRING CLINICIAN: Terrilyn Fick, NP    CLINICAL INFORMATION/HISTORY:  video visit form 07-14-2023. AL. 63 y.o. year old female  has a past medical history of Allergic rhinitis, HSV-2 infection, Hyperlipidemia, Hypothyroidism, OSA on CPAP, Severe obesity (HCC), SUI (stress urinary incontinence, female), and Type 2 diabetes mellitus (HCC).  Alejandra Ray is a 63 y.o. female here today for follow up for OSA on CPAP. She was last seen 05/2022 and needing a new machine. HST ordered but not completed. She continues to do well on therapy. No concerns with machine or supplies. She is using therapy nightly for about 8 hours, on average. She is ready to get a new machine.   Settings 5-12 cm water, 2 cm EPR , 95% pressure at 9 cm water . 97% compliance.   Epworth sleepiness score: X /24. FSS at X / 63 points.  GDS  X/ 15 points.    BMI: X kg/m   Neck Circumference: X   FINDINGS:   Sleep Summary: CMS criteria were used to interpret this study.    Total Recording Time (hours, min):     10 hours   Total Sleep Time (hours, min): 8 hours 33 minutes               Percent REM (%): 16%     Sleep was very fragmented, sleep latency was 34 minutes, REM sleep latency was only 39 minutes.  There were 53 minutes of wakefulness after sleep onset time.                                   Respiratory Indices:   Calculated pAHI (per CMS guideline):.   7.7/h                      REM pAHI:   5.2/h                                              NREM pAHI:     8.2/h                         Positional AHI:    This patient slept equal amounts of time in her right and left lateral position associated with an AHI of 5.4/h versus 7.6/h.  Snoring:     Mean volume of snoring was 46 dB which is extremely loud  and snoring was present for 88% of total sleep time.                                           Oxygen Saturation Statistics:   Oxygen Saturation (%) Mean: 93%               O2 Saturation Range (%):   Between the nadir at 88% with a maximal saturation of 98%  O2 Saturation (minutes) <89%:     0 minutes      Pulse Rate Statistics:   Pulse Mean (bpm):     61 bpm            Pulse Range:    Lowest heart rate was 38 bpm, highest heart rate 118 bpm during sleep this variability is extremely high and bradycardia was seen during REM sleep phases.             IMPRESSION:  This HST confirms the presence of very mild and all obstructive sleep apnea but this patient has an extremely variable pulse rate, bradycardia and tachycardias are seen in spite of no significant hypoxia being present.  Snoring was extremely loud in non-supine sleep.   RECOMMENDATION: I continued the auto titration CPAP therapy that the patient has been so compliant with. The replacement autotitration CPAP device will offer similar settings between 5 and 12 cm water pressure with 2 cm expiratory pressure relief, heated humidification and mask of patient's choice.    INTERPRETING PHYSICIAN: Neomia Banner, MD  [facility] and [facility] Sleep Board certified by The [employer] of Sleep Medicine and Diplomate of the [employer] of Sleep Medicine. Board certified In Neurology through the ABPN, Fellow of the [employer] of Neu[employer].   Carbon copy to Terrilyn Fick, NP

## 2023-09-07 NOTE — Procedures (Signed)
 [facility] at [facility]  Alejandra Ray 63 year old female 1960-07-02   HOME SLEEP TEST REPORT ( by Watch PAT)   STUDY DATE:  08-17-2023    ORDERING CLINICIAN: Neomia Banner, MD  REFERRING CLINICIAN: Terrilyn Fick, NP    CLINICAL INFORMATION/HISTORY:  video visit form 07-14-2023. AL. 63 y.o. year old female  has a past medical history of Allergic rhinitis, HSV-2 infection, Hyperlipidemia, Hypothyroidism, OSA on CPAP, Severe obesity (HCC), SUI (stress urinary incontinence, female), and Type 2 diabetes mellitus (HCC).  Alejandra Ray is a 63 y.o. female here today for follow up for OSA on CPAP. She was last seen 05/2022 and needing a new machine. HST ordered but not completed. She continues to do well on therapy. No concerns with machine or supplies. She is using therapy nightly for about 8 hours, on average. She is ready to get a new machine.   Settings 5-12 cm water, 2 cm EPR , 95% pressure at 9 cm water . 97% compliance.   Epworth sleepiness score: X /24. FSS at X / 63 points.  GDS  X/ 15 points.    BMI: X kg/m   Neck Circumference: X   FINDINGS:   Sleep Summary: CMS criteria were used to interpret this study.    Total Recording Time (hours, min):     10 hours   Total Sleep Time (hours, min): 8 hours 33 minutes               Percent REM (%): 16%     Sleep was very fragmented, sleep latency was 34 minutes, REM sleep latency was only 39 minutes.  There were 53 minutes of wakefulness after sleep onset time.                                   Respiratory Indices:   Calculated pAHI (per CMS guideline):.   7.7/h                      REM pAHI:   5.2/h                                              NREM pAHI:     8.2/h                         Positional AHI:    This patient slept equal amounts of time in her right and left lateral position associated with an AHI of 5.4/h versus 7.6/h.  Snoring:     Mean volume of snoring was 46 dB which is extremely loud and snoring  was present for 88% of total sleep time.                                           Oxygen Saturation Statistics:   Oxygen Saturation (%) Mean: 93%               O2 Saturation Range (%):   Between the nadir at 88% with a maximal saturation of 98%  O2 Saturation (minutes) <89%:     0 minutes      Pulse Rate Statistics:   Pulse Mean (bpm):     61 bpm            Pulse Range:    Lowest heart rate was 38 bpm, highest heart rate 118 bpm during sleep this variability is extremely high and bradycardia was seen during REM sleep phases.             IMPRESSION:  This HST confirms the presence of very mild and all obstructive sleep apnea but this patient has an extremely variable pulse rate, bradycardia and tachycardias are seen in spite of no significant hypoxia being present.  Snoring was extremely loud in non-supine sleep.   RECOMMENDATION: I continued the auto titration CPAP therapy that the patient has been so compliant with. The replacement autotitration CPAP device will offer similar settings between 5 and 12 cm water pressure with 2 cm expiratory pressure relief, heated humidification and mask of patient's choice.    INTERPRETING PHYSICIAN: Neomia Banner, MD  [facility] and [facility] Sleep Board certified by The [employer] of Sleep Medicine and Diplomate of the [employer] of Sleep Medicine. Board certified In Neurology through the ABPN, Fellow of the [employer] Neu[employer].   Carbon copy to Terrilyn Fick, NP

## 2023-09-08 ENCOUNTER — Telehealth: Payer: Self-pay | Admitting: Neurology

## 2023-09-08 NOTE — Telephone Encounter (Signed)
 I called pt. I advised pt that Dr. Albertina Hugger reviewed their sleep study results and found that pt has sleep apnea. Dr. Albertina Hugger recommends that pt continues auto CPAP. I reviewed PAP compliance expectations with the pt. Pt is agreeable to starting a CPAP. I advised pt that an order will be sent to a DME, [facility], and [facility] will call the pt within about one week after they file with the pt's insurance. [facility] will show the pt how to use the machine, fit for masks, and troubleshoot the CPAP if needed. A follow up appt was made for insurance purposes with Terrilyn Fick, NP  on 11/30/2023 at 3:30 pm. Pt verbalized understanding to arrive 15 minutes early and bring their CPAP. Pt verbalized understanding of results. Pt had no questions at this time but was encouraged to call back if questions arise. I have sent the order to [facility] and have received confirmation that they have received the order.

## 2023-09-08 NOTE — Telephone Encounter (Signed)
-----   Message from Nurse Gabriel John B sent at 09/08/2023  8:07 AM EDT -----  ----- Message ----- From: Neomia Banner, MD Sent: 09/07/2023   7:55 PM EDT To: Terrilyn Fick, NP

## 2023-10-25 DIAGNOSIS — R413 Other amnesia: Secondary | ICD-10-CM | POA: Insufficient documentation

## 2023-10-26 ENCOUNTER — Telehealth: Payer: Self-pay | Admitting: Family Medicine

## 2023-10-26 NOTE — Telephone Encounter (Signed)
 Pt called stating that Pt was suppose to get a new cpap machine  and to call company to get those supplies  , However pt  does not  remember what Dme to cal I gave [facility] number but pt states its not [facility].

## 2023-10-26 NOTE — Telephone Encounter (Addendum)
 Reviewed chart. Order for pt to get new cpap sent to [facility] on 09/08/23.

## 2023-10-26 NOTE — Telephone Encounter (Signed)
 Called pt back and let her know from looking at her chart she dose have [facility] for her DME company. Pt stated that she called them and has her CPAP setup appointment for Friday 10/29/23. Pt thanked me for calling.

## 2023-11-18 NOTE — Telephone Encounter (Signed)
 Pt called and confirmed appointment details, she is aware to bring cpap and power cord

## 2023-11-29 NOTE — Progress Notes (Signed)
 SABRA

## 2023-11-30 ENCOUNTER — Encounter: Payer: Self-pay | Admitting: Family Medicine

## 2023-11-30 ENCOUNTER — Ambulatory Visit: Admitting: Family Medicine

## 2023-11-30 VITALS — BP 133/74 | HR 74 | Ht 66.0 in | Wt 193.5 lb

## 2023-11-30 DIAGNOSIS — G4733 Obstructive sleep apnea (adult) (pediatric): Secondary | ICD-10-CM | POA: Diagnosis not present

## 2023-11-30 NOTE — Progress Notes (Signed)
 PATIENT: Alejandra Ray DOB: Mar 22, 1961  REASON FOR VISIT: follow up HISTORY FROM: patient  Chief Complaint  Patient presents with   RM1/CPAP    Pt is here Alone. Pt states that she likes her CPAP Machine. No new complaints or concerns today     HISTORY OF PRESENT ILLNESS:  11/30/23 ALL: Alejandra Ray returns for initial CPAP follow up after getting new device. HST 08/2023 showed very mild and all obstructive sleep apnea (AHI 7.7/h) but this patient has an extremely variable pulse rate, bradycardia and tachycardias are seen in spite of no significant hypoxia being present. Snoring was extremely loud in non-supine sleep. AutoPAP ordered. Since, she reports doing well. She is using therapy nightly for about 7-8 hours, on average. She denies concerns with machine or supplies. She is enjoying retirement. She has lost about 40lbs.      07/14/23 ALL: Alejandra Ray is a 63 y.o. female here today for follow up for OSA on CPAP. She was last seen 05/2022 and needing a new machine. HST ordered but not completed. She continues to do well on therapy. No concerns with machine or supplies. She is using therapy nightly for about 8 hours, on average. She is ready to get a new machine.       05/26/2022 ALL:  Alejandra Ray returns for follow up for OSA on CPAP. She continues to do well. She is using CPAP nightly for about 8 hours. She has been receiving an error message on her machine for the past 6 months stating it needs servicing. She has reached out to DME but reports not hearing back. Machine is approx 65 years old. Last sleep study 2015. She is sleeping well and has not noticed any specific concerns with her CPAP. No data is available for review on [facility].    05/20/2021 ALL: Alejandra Ray returns for follow up for OSA on CPAP. She continues to do well. She is using CPAP nightly. She does feel that she sleeps better using CPAP. Her machine is about 65 years old. She feels that it works fine and is not able  to afford a new machine at this time. No concerns with machine or supplies. She continues to work full time. She is feeling well, today.   Last sleep study 2015.     05/20/2020 ALL: She is here today for CPAP follow-up.  She continues to do very well with CPAP therapy.  She denies difficulty with her machine or getting supplies.  Compliance report dated 04/16/2020 through 05/15/2020 reveals that she used CPAP 30 of the past 30 days for compliance of 100%.  She is CPAP greater than 4 hours all 30 days.  Average usage was 8 hours and 10 minutes.  Residual AHI was 0.9 on 5 to 12 cm of water and an EPR of two.  There was no leak noted.  History (copied from previous note) 02/06/2019 Alejandra Ray is a 63 y.o. female here today for follow up for OSA on CPAP.  She reports doing very well with therapy.  She definitely notes benefit.  Compliance report dated 01/03/2019 through 01/29/2019 reveals that she is using CPAP every night for compliance of 100%.  Every night she used CPAP greater than 4 hours.  Average usage was 8 hours and 43 minutes.  AHI was 1.0 on 5 to 12 cm of water and an EPR of 2.  There was no significant leak noted.  She is feeling well today and without complaints.  HISTORY: (copied  from [facility] note on 06/21/2017)  Alejandra Ray is a 63 year old female with a history of obstructive sleep apnea on CPAP.  She returns today for compliance download.  Her download indicates that she use her machine 30 out of 30 days for compliance of 100%.  She used her machine greater than 4 hours each night.  On average she uses her machine 7 hours and 51 minutes.  Her residual AHI is 1.5 on minimum pressure of 5 cm of water and maximum pressure of 12 cm of water with EPR of 2.  She does not have a leak with her mask.  Overall she reports that she is doing well.  She reports that her Epworth sleepiness score is slightly elevated but this is due to her job.  She reports that her job is very strenuous.  She  returns today for an evaluation.   HISTORY History from the eighth of February  2018,  Alejandra Ray is here for her yearly compliance visit which has been 100% over the last 30 days with average user time 8 hours and 20 minutes, she is using AutoSet CPAP between 5 and 12 cm water with a full-time expiratory pressure relief of 2 cm water and a residual AHI of 2.2 minimal air leaks using a nasal pillow, she has recovered from her in contact dermatitis that she first displayed, using just Vaseline to protect her skin. The 95th percentile pressure is 8.6 cm water.    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 She has found herself unemployed for 4 months now. She has started to sleep more as she doesn't have to get up early in the morning, and there is probably some component of depression   REVIEW OF SYSTEMS: Out of a complete 14 system review of symptoms, the patient complains only of the following symptoms, none and all other reviewed systems are negative.  ESS: 5/24  ALLERGIES: Allergies  Allergen Reactions   Codeine Nausea And Vomiting    Blacked out   Other     PLASTICS   Sulfa Antibiotics Nausea And Vomiting    HOME MEDICATIONS: Outpatient Medications Prior to Visit  Medication Sig Dispense Refill   dapagliflozin propanediol (FARXIGA) 10 MG TABS tablet Take 10 mg by mouth daily.     glucose blood test strip Use to check blood sugars daily for type 2 diabetes 100 each prn   Lancet Devices (AUTO-LANCET) MISC Use to check blood sugars daily for type 2 diabetes - Dispense device compatible with glucometer 100 each 6   levothyroxine  (SYNTHROID ) 50 MCG tablet Take 1 tablet (50 mcg total) by mouth daily. 30 tablet 6   magnesium 30 MG tablet Take 30 mg by mouth at bedtime.     metFORMIN  (GLUCOPHAGE ) 1000 MG tablet Take 1 tablet (1,000 mg total) by mouth 2 (two) times daily  with a meal. 180 tablet 3   MICROLET LANCETS MISC Use to check blood sugars daily for type 2 diabetes 100 each 3   milk thistle 175 MG tablet Take 175 mg by mouth daily.     Misc Natural Products (RED CLOVER COMBINATION PO) Take 200 mg by mouth daily.     Multiple Vitamin (MULTIVITAMIN) capsule Take 1 capsule by mouth daily.     Omega-3 1000 MG CAPS Take 1 capsule by mouth daily.     OZEMPIC, 1 MG/DOSE, 4 MG/3ML SOPN  Inject 1 mg into the skin once a week.     rosuvastatin  (CRESTOR ) 10 MG tablet Take 1 tablet (10 mg total) by mouth daily. 30 tablet 6   vitamin B-12 (CYANOCOBALAMIN) 500 MCG tablet Take 500 mcg by mouth daily.     scopolamine  (TRANSDERM-SCOP) 1 MG/3DAYS Place 1 patch onto the skin daily as needed (when patient goes on cruises). (Patient not taking: Reported on 11/30/2023)     No facility-administered medications prior to visit.    PAST MEDICAL HISTORY: Past Medical History:  Diagnosis Date   Allergic rhinitis    HSV-2 infection    Hyperlipidemia    Hypothyroidism    OSA on CPAP    Severe obesity (HCC)    SUI (stress urinary incontinence, female)    Type 2 diabetes mellitus (HCC)     PAST SURGICAL HISTORY: Past Surgical History:  Procedure Laterality Date   CHOLECYSTECTOMY     KNEE ARTHROSCOPY Right    TUBAL LIGATION Bilateral 2001    FAMILY HISTORY: Family History  Problem Relation Age of Onset   Diabetes Mother    Hyperlipidemia Mother    Hypertension Mother    Lung cancer Father        smoker   Hypertension Sister    Hypertension Sister    Testicular cancer Son    Hypertension Sister    Hypertension Sister     SOCIAL HISTORY: Social History   Socioeconomic History   Marital status: Married    Spouse name: Alm Boon   Number of children: 1   Years of education: College   Highest education level: Not on file  Occupational History   Occupation: PRODUCTION    Comment: [employer] ([employer])  Tobacco Use   Smoking status: Former    Current  packs/day: 0.00    Types: Cigarettes    Quit date: 05/13/1996    Years since quitting: 27.5   Smokeless tobacco: Never  Vaping Use   Vaping status: Never Used  Substance and Sexual Activity   Alcohol use: Yes    Comment: Grey goose or beer. 3 drinks/wk   Drug use: Yes    Types: Marijuana    Comment: occassionally   Sexual activity: Never  Other Topics Concern   Not on file  Social History Narrative   Patient is married Alejandra Ray) and lives at home with her husband.   Patient has one adult son who lives in [place].   Husband has 2 daughters.   Patient is right-handed.   Patient does not drink any caffeine.    Education: some college. Exercise: No   Social Drivers of Corporate investment banker Strain: Low Risk  (07/06/2023)   Received from [facility]   Overall Financial Resource Strain (CARDIA)    Difficulty of Paying Living Expenses: Not hard at all  Food Insecurity: No Food Insecurity (07/06/2023)   Received from [facility]   Hunger Vital Sign    Within the past 12 months, you worried that your food would run out before you got the money to buy more.: Never true    Within the past 12 months, the food you bought just didn't last and you didn't have money to get more.: Never true  Transportation Needs: No Transportation Needs (07/06/2023)   Received from [facility] - Transportation    Lack of Transportation (Medical): No    Lack of Transportation (Non-Medical): No  Physical Activity: Insufficiently Active (07/06/2023)   Received from [facility]   Exercise Vital Sign    On average, how many days per week do you engage in moderate to strenuous exercise (like a brisk walk)?: 3 days    On average, how many minutes do you engage in exercise at this level?: 30 min  Stress: No Stress Concern Present (07/06/2023)   Received from [facility] - Occupational Stress Questionnaire    Feeling of Stress : Not at all   Social Connections: Socially Integrated (07/06/2023)   Received from [facility]   Social Network    How would you rate your social network (family, work, friends)?: Good participation with social networks  Intimate Partner Violence: Not At Risk (07/06/2023)   Received from [facility]   HITS    Over the last 12 months how often did your partner physically hurt you?: Never    Over the last 12 months how often did your partner insult you or talk down to you?: Never    Over the last 12 months how often did your partner threaten you with physical harm?: Never    Over the last 12 months how often did your partner scream or curse at you?: Never      PHYSICAL EXAM  Vitals:   11/30/23 1527  BP: 133/74  Pulse: 74  SpO2: 96%  Weight: 193 lb 8 oz (87.8 kg)  Height: 5' 6 (1.676 m)      Body mass index is 31.23 kg/m.  Generalized: Well developed, in no acute distress  Cardiology: normal rate and rhythm, no murmur noted Respiratory: Clear to auscultation bilaterally Mallampati 3+, neck circ 17.5 Neurological examination  Mentation: Alert oriented to time, place, history taking. Follows all commands speech and language fluent Cranial nerve II-XII: Pupils were equal round reactive to light. Extraocular movements were full, visual field were full on confrontational test. Facial sensation and strength were normal. Uvula tongue midline. Head turning and shoulder shrug  were normal and symmetric. Motor: The motor testing reveals 5 over 5 strength of all 4 extremities. Good symmetric motor tone is noted throughout.  Gait and station: Gait is normal  DIAGNOSTIC DATA (LABS, IMAGING, TESTING) - I reviewed patient records, labs, notes, testing and imaging myself where available.      No data to display           Lab Results  Component Value Date   WBC 8.5 03/27/2023   HGB 15.3 (H) 03/27/2023   HCT 45.0 03/27/2023   MCV 90.1 03/27/2023   PLT 264 03/27/2023      Component  Value Date/Time   NA 139 03/27/2023 1344   NA 139 12/13/2018 1018   K 3.8 03/27/2023 1344   CL 104 03/27/2023 1344   CO2 22 03/27/2023 1319   GLUCOSE 144 (H) 03/27/2023 1344   BUN 15 03/27/2023 1344   BUN 14 12/13/2018 1018   CREATININE 0.70 03/27/2023 1344   CREATININE 0.66 03/18/2016 0907   CALCIUM  9.9 03/27/2023 1319   PROT 7.7 03/27/2023 1319   PROT 7.8 12/13/2018 1018   ALBUMIN 4.3 03/27/2023 1319   ALBUMIN 5.2 (H) 12/13/2018 1018   AST 27 03/27/2023 1319   ALT 37 03/27/2023 1319   ALKPHOS 39 03/27/2023 1319   BILITOT 0.8 03/27/2023 1319   BILITOT 0.5 12/13/2018 1018   GFRNONAA >60 03/27/2023 1319   GFRNONAA >89 01/24/2014 1042   GFRAA 97 12/13/2018 1018   GFRAA >89 01/24/2014 1042   Lab Results  Component  Value Date   CHOL 136 12/13/2018   HDL 59 12/13/2018   LDLCALC 61 12/13/2018   LDLDIRECT 105 (H) 03/24/2012   TRIG 79 12/13/2018   CHOLHDL 2.3 12/13/2018   Lab Results  Component Value Date   HGBA1C 7.3 (H) 12/13/2018   Lab Results  Component Value Date   VITAMINB12 >2000 (H) 01/24/2015   Lab Results  Component Value Date   TSH 2.890 12/13/2018     ASSESSMENT AND PLAN 64 y.o. year old female  has a past medical history of Allergic rhinitis, HSV-2 infection, Hyperlipidemia, Hypothyroidism, OSA on CPAP, Severe obesity (HCC), SUI (stress urinary incontinence, female), and Type 2 diabetes mellitus (HCC). here with     ICD-10-CM   1. OSA on CPAP  G47.33       Tesia is doing very well on CPAP therapy. Compliance report shows excellent compliance. We reveiwed sleep study results and risks of untreated sleep apnea. May consider retesting once she meets weight loss goals. She was encouraged to continue CPAP nightly and for greater than 4 hours each night. She will follow-up with me in 1 year. She verbalizes understanding and agreement with this plan.   No orders of the defined types were placed in this encounter.    No orders of the defined types were  placed in this encounter.   I personally spent a total of 30 minutes in the care of the patient today including preparing to see the patient, getting/reviewing separately obtained history, performing a medically appropriate exam/evaluation, counseling and educating, independently interpreting results, and communicating results.   Greig Forbes, FNP-C 11/30/2023, 3:55 PM [facility] [address], [address] [place], [place] [address] [phone]

## 2023-11-30 NOTE — Patient Instructions (Signed)

## 2024-02-03 ENCOUNTER — Encounter: Payer: Self-pay | Admitting: Family Medicine

## 2024-05-24 ENCOUNTER — Ambulatory Visit (HOSPITAL_BASED_OUTPATIENT_CLINIC_OR_DEPARTMENT_OTHER): Admitting: Family Medicine

## 2024-05-30 ENCOUNTER — Encounter (HOSPITAL_BASED_OUTPATIENT_CLINIC_OR_DEPARTMENT_OTHER): Payer: Self-pay

## 2024-05-30 ENCOUNTER — Ambulatory Visit (INDEPENDENT_AMBULATORY_CARE_PROVIDER_SITE_OTHER)

## 2024-05-30 VITALS — BP 132/83 | HR 79 | Temp 98.9°F | Ht 66.0 in | Wt 190.2 lb

## 2024-05-30 DIAGNOSIS — R81 Glycosuria: Secondary | ICD-10-CM | POA: Diagnosis not present

## 2024-05-30 DIAGNOSIS — B9789 Other viral agents as the cause of diseases classified elsewhere: Secondary | ICD-10-CM

## 2024-05-30 DIAGNOSIS — J019 Acute sinusitis, unspecified: Secondary | ICD-10-CM | POA: Diagnosis not present

## 2024-05-30 DIAGNOSIS — E119 Type 2 diabetes mellitus without complications: Secondary | ICD-10-CM | POA: Diagnosis not present

## 2024-05-30 DIAGNOSIS — Z7689 Persons encountering health services in other specified circumstances: Secondary | ICD-10-CM | POA: Diagnosis not present

## 2024-05-30 LAB — HEMOGLOBIN A1C
Est. average glucose Bld gHb Est-mCnc: 134 mg/dL
Hgb A1c MFr Bld: 6.3 % — ABNORMAL HIGH (ref 4.8–5.6)

## 2024-05-30 LAB — COMPREHENSIVE METABOLIC PANEL WITH GFR
ALT: 31 IU/L (ref 0–32)
AST: 20 IU/L (ref 0–40)
Albumin: 4.8 g/dL (ref 3.9–4.9)
Alkaline Phosphatase: 50 IU/L (ref 49–135)
BUN/Creatinine Ratio: 15 (ref 12–28)
BUN: 10 mg/dL (ref 8–27)
Bilirubin Total: 0.5 mg/dL (ref 0.0–1.2)
CO2: 21 mmol/L (ref 20–29)
Calcium: 9.9 mg/dL (ref 8.7–10.3)
Chloride: 104 mmol/L (ref 96–106)
Creatinine, Ser: 0.65 mg/dL (ref 0.57–1.00)
Globulin, Total: 2.7 g/dL (ref 1.5–4.5)
Glucose: 107 mg/dL — ABNORMAL HIGH (ref 70–99)
Potassium: 4.6 mmol/L (ref 3.5–5.2)
Sodium: 140 mmol/L (ref 134–144)
Total Protein: 7.5 g/dL (ref 6.0–8.5)
eGFR: 97 mL/min/1.73

## 2024-05-30 NOTE — Patient Instructions (Signed)
 Blood sugar normal at today's visit.  Flu was negative. Continue symptom management.  I am checking labs today to assess your kidney function.

## 2024-05-30 NOTE — Progress Notes (Signed)
 "   New Patient Office Visit  Subjective:   Alejandra Ray Sep 09, 1960 05/30/2024  Chief Complaint  Patient presents with   New Patient (Initial Visit)    Patient is here to get established PCP. Patient has been having symptoms of sinus pressure and headaches for about 3 weeks.     HPI: Alejandra Ray presents today to establish care at [facility] at [facility]. Introduced to publishing rights manager role and practice setting with verbalized understanding by patient.  All questions answered.   Last PCP: 2025 Last annual physical: 2025 Concerns: See below   Sinus Congestion: States it has started about 3 days ago. States a few weeks ago she was having sinus pressure and headaches. States she started sudafed which helped but then it caused her to have an upset stomach. Denies any known sick contacts. States she woke up Sunday with a sore throat and swollen glands. Requesting flu swab.  Urinary Complaints: States she started having burning and urgency two days ago. Denies any fever or CVA tenderness. Denies hematuria.   The following portions of the patient's history were reviewed and updated as appropriate: past medical history, past surgical history, family history, social history, allergies, medications, and problem list.   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   Colon polyp 06/30/2022   Hyperlipidemia associated with type 2 diabetes mellitus (HCC) 12/13/2018   Muscle spasm 12/13/2018   Hyperlipidemia 03/12/2017   Obesity (BMI 30-39.9) 06/18/2016   BMI 31.0-31.9,adult 06/18/2016   OSA on CPAP 06/18/2014   UARS (upper airway resistance syndrome) 06/18/2014   Snoring 03/15/2014   Stress headaches 03/15/2014   Hypothyroidism 01/24/2014   Screening for breast cancer 08/23/2013   Perimenopausal 08/03/2011   SUI (stress urinary incontinence, female) 08/03/2011    Allergic rhinitis 08/03/2011   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   RLS (restless legs syndrome) 08/03/2011   Type 2 diabetes mellitus with complication, without long-term current use of insulin (HCC)    Past Medical History:  Diagnosis Date   Allergic rhinitis    HSV-2 infection    Hyperlipidemia    Hypothyroidism    OSA on CPAP    Severe obesity (HCC)    SUI (stress urinary incontinence, female)    Type 2 diabetes mellitus (HCC)    Past Surgical History:  Procedure Laterality Date   CHOLECYSTECTOMY     KNEE ARTHROSCOPY Right    TUBAL LIGATION Bilateral 2001   Family History  Problem Relation Age of Onset   Diabetes Mother    Hyperlipidemia Mother    Hypertension Mother    Lung cancer Father        smoker   Hypertension Sister    Hypertension Sister    Testicular cancer Son    Hypertension Sister    Hypertension Sister    Social History   Socioeconomic History   Marital status: Married    Spouse name: Alm Boon   Number of children: 1   Years of education: College   Highest education level: Not on file  Occupational History   Occupation: PRODUCTION    Comment: [employer] ([employer])  Tobacco Use   Smoking status: Former    Current packs/day: 0.00    Types: Cigarettes    Quit date: 05/13/1996    Years since quitting: 28.0   Smokeless tobacco: Never  Vaping Use   Vaping status: Never Used  Substance and Sexual Activity   Alcohol use: Yes    Comment: Grey goose or beer. 3 drinks/wk   Drug use: Yes    Types: Marijuana    Comment: occassionally   Sexual activity: Never  Other Topics Concern   Not on file  Social History Narrative   Patient is married [place]) and lives at home with her husband.   Patient has one adult son who lives in [place].   Husband has 2 daughters.   Patient is right-handed.   Patient does not drink any caffeine.    Education: some college. Exercise: No   Social Drivers of Health   Tobacco Use: Medium Risk (05/30/2024)   Patient  History    Smoking Tobacco Use: Former    Smokeless Tobacco Use: Never    Passive Exposure: Not on file  Financial Resource Strain: Low Risk (01/26/2024)   Received from [facility]   Overall Financial Resource Strain (CARDIA)    How hard is it for you to pay for the very basics like food, housing, medical care, and heating?: Not very hard  Food Insecurity: No Food Insecurity (01/26/2024)   Received from [facility]   Epic    Within the past 12 months, you worried that your food would run out before you got the money to buy more.: Never true    Within the past 12 months, the food you bought just didn't last and you didn't have money to get more.: Never true  Transportation Needs: Unknown (01/26/2024)   Received from [facility]    In the past 12 months, has lack of transportation kept you from medical appointments or from getting medications?: No    Lack of Transportation (Non-Medical): Not on file  Physical Activity: Insufficiently Active (01/26/2024)   Received from [facility]   Exercise Vital Sign    On average, how many days per week do you engage in moderate to strenuous exercise (like a brisk walk)?: 2 days    On average, how many minutes do you engage in exercise at this level?: 60 min  Stress: No Stress Concern Present (01/26/2024)   Received from [facility] - Occupational Stress Questionnaire    Do you feel stress - tense, restless, nervous, or anxious, or unable to sleep at night because your mind is troubled all the time - these days?: Only a little  Social Connections: Socially Integrated (01/26/2024)   Received from [facility]   Social Network    How would you rate your social network (family, work, friends)?: Good participation with social networks  Intimate Partner Violence: Not At Risk (01/26/2024)   Received from [facility]   HITS    Over the last 12 months how often did your partner physically hurt you?:  Never    Over the last 12 months how often did your partner insult you or talk down to you?: Never    Over the last 12 months how often did your partner threaten you with physical harm?: Never    Over the last 12 months how often did your partner scream or curse at you?: Rarely  Depression (PHQ2-9): Low Risk (05/30/2024)   Depression (PHQ2-9)    PHQ-2 Score: 0  Alcohol Screen: Not on file  Housing: Low Risk (01/26/2024)   Received from [facility]    In the last 12 months, was there a time when you were not able to  pay the mortgage or rent on time?: No    In the past 12 months, how many times have you moved where you were living?: 0    At any time in the past 12 months, were you homeless or living in a shelter (including now)?: No  Utilities: Not At Risk (01/26/2024)   Received from [facility]    In the past 12 months has the electric, gas, oil, or water company threatened to shut off services in your home?: No  Health Literacy: Not on file   Outpatient Medications Prior to Visit  Medication Sig Dispense Refill   dapagliflozin propanediol (FARXIGA) 10 MG TABS tablet Take 10 mg by mouth daily.     glucose blood test strip Use to check blood sugars daily for type 2 diabetes 100 each prn   Lancet Devices (AUTO-LANCET) MISC Use to check blood sugars daily for type 2 diabetes - Dispense device compatible with glucometer 100 each 6   levothyroxine  (SYNTHROID ) 50 MCG tablet Take 1 tablet (50 mcg total) by mouth daily. 30 tablet 6   magnesium 30 MG tablet Take 30 mg by mouth at bedtime.     metFORMIN  (GLUCOPHAGE ) 1000 MG tablet Take 1 tablet (1,000 mg total) by mouth 2 (two) times daily with a meal. 180 tablet 3   MICROLET LANCETS MISC Use to check blood sugars daily for type 2 diabetes 100 each 3   milk thistle 175 MG tablet Take 175 mg by mouth daily.     Misc Natural Products (RED CLOVER COMBINATION PO) Take 200 mg by mouth daily.     Multiple Vitamin (MULTIVITAMIN) capsule  Take 1 capsule by mouth daily.     Omega-3 1000 MG CAPS Take 1 capsule by mouth daily.     OZEMPIC, 1 MG/DOSE, 4 MG/3ML SOPN Inject 1 mg into the skin once a week.     rosuvastatin  (CRESTOR ) 10 MG tablet Take 1 tablet (10 mg total) by mouth daily. 30 tablet 6   vitamin B-12 (CYANOCOBALAMIN) 500 MCG tablet Take 500 mcg by mouth daily.     scopolamine  (TRANSDERM-SCOP) 1 MG/3DAYS Place 1 patch onto the skin daily as needed (when patient goes on cruises). (Patient not taking: Reported on 05/30/2024)     No facility-administered medications prior to visit.   Allergies[1]  ROS: A complete ROS was performed with pertinent positives/negatives noted in the HPI. The remainder of the ROS are negative.   Objective:   Today's Vitals   05/30/24 1304 05/30/24 1353  BP: (!) 142/84 132/83  Pulse: 79   Temp: 98.9 F (37.2 C)   SpO2: 96%   Weight: 190 lb 3.2 oz (86.3 kg)   Height: 5' 6 (1.676 m)     GENERAL: Well-appearing, in NAD. Well nourished. NECK: Trachea midline. Full ROM w/o pain or tenderness. Mild bilateral lymphadenopathy.  EARS: Tympanic membranes are intact, translucent without bulging and without drainage. Appropriate landmarks visualized.  EYES: Conjunctiva clear without exudates. EOMI, PERRL, no drainage present.  NOSE: Septum midline w/o deformity. Nares patent, mucosa pink and non-inflamed w/o drainage. No sinus tenderness. Left nare with mild inflammation. THROAT: Uvula midline. Oropharynx clear. Tonsils non-inflamed without exudate. Mucous membranes pink and moist.  RESPIRATORY: Chest wall symmetrical. Respirations even and non-labored. Breath sounds clear to auscultation bilaterally.  CARDIAC: S1, S2 present, regular rate and rhythm without murmur or gallops. Peripheral pulses 2+ bilaterally.  MSK: Muscle tone and strength appropriate for age. Joints w/o tenderness, redness, or swelling.  EXTREMITIES: Without clubbing, cyanosis, or edema.  NEUROLOGIC: No motor or sensory  deficits. Steady, even gait. C2-C12 intact.  PSYCH/MENTAL STATUS: Alert, oriented x 3. Cooperative, appropriate mood and affect.    Health Maintenance Due  Topic Date Due   FOOT EXAM  09/18/2018   Mammogram  02/06/2019   HEMOGLOBIN A1C  06/15/2019   Diabetic kidney evaluation - Urine ACR  12/13/2019   OPHTHALMOLOGY EXAM  04/12/2020   Bone Density Scan  Never done   COVID-19 Vaccine (7 - 2025-26 season) 01/10/2024   Diabetic kidney evaluation - eGFR measurement  03/26/2024   Medicare Annual Wellness (AWV)  07/05/2024    No results found for any visits on 05/30/24.     Assessment & Plan:  1. Encounter to establish care with new doctor (Primary) Discussed role of NP and expectations of the Primary Care Clinic. Discussed medical, surgical, and family history.   2. Type 2 diabetes mellitus without complication, without long-term current use of insulin (HCC) Repeating lab work today since labs have not been done in over 3 months.  - Comprehensive metabolic panel with GFR - Hemoglobin A1c 3. Glucosuria In the setting of her glucosuria in office, I am repeating labs to check a kidney function. CBG was 111 in office which matches her home readings.   4. Acute viral sinusitis Discussed continued symptom management in the setting of her negative flu swab. I also advised her to try a different medication such as AlkaSeltzer cold and flu to help with sinus congestion. Discussed close follow up with PCP if symptoms do not resolve.    Patient to reach out to office if new, worrisome, or unresolved symptoms arise or if no improvement in patient's condition. Patient verbalized understanding and is agreeable to treatment plan. All questions answered to patient's satisfaction.    Return in about 4 weeks (around 06/27/2024) for annual physical (fasting labs day of).    Lauraine Almarie Angus DNP, FNP-C     [1]  Allergies Allergen Reactions   Codeine Nausea And Vomiting    Blacked out    Other     PLASTICS   Sulfa Antibiotics Nausea And Vomiting   "

## 2024-06-01 ENCOUNTER — Ambulatory Visit (HOSPITAL_BASED_OUTPATIENT_CLINIC_OR_DEPARTMENT_OTHER): Payer: Self-pay

## 2024-06-01 DIAGNOSIS — R81 Glycosuria: Secondary | ICD-10-CM

## 2024-06-01 LAB — POCT URINALYSIS DIP (MANUAL ENTRY)
Bilirubin, UA: NEGATIVE
Blood, UA: NEGATIVE
Glucose, UA: 500 mg/dL — AB
Ketones, POC UA: NEGATIVE mg/dL
Leukocytes, UA: NEGATIVE
Nitrite, UA: NEGATIVE
Protein Ur, POC: NEGATIVE mg/dL
Spec Grav, UA: 1.025
Urobilinogen, UA: 0.2 U/dL
pH, UA: 5.5

## 2024-06-01 NOTE — Progress Notes (Signed)
 Patient going to come in office to provide urinalysis with micro sample for us  to send off - just fyi   Hi Rakiya, Your labwork we drew at your appointment is normal which is good. However, in the setting of your increased glucose noted in your urine, I would like to order a urinalysis that gets sent down to the lab for more detailed testing. I am going to place the order and you can come into the office to provide us  with a sample when you are free. In the meantime please increase your water intake. Once we get that result we will have a better idea of how to move forward!   Lauraine Almarie Angus DNP, FNP-C

## 2024-06-01 NOTE — Addendum Note (Signed)
 Addended by: ZIMMERMAN RUMPLE, Kimberlly Norgard D on: 06/01/2024 03:26 PM   Modules accepted: Orders

## 2024-06-27 ENCOUNTER — Encounter (HOSPITAL_BASED_OUTPATIENT_CLINIC_OR_DEPARTMENT_OTHER)

## 2024-11-27 ENCOUNTER — Ambulatory Visit: Admitting: Family Medicine

## 2024-11-30 ENCOUNTER — Ambulatory Visit: Admitting: Family Medicine
